# Patient Record
Sex: Male | Born: 1965 | Race: White | Hispanic: No | Marital: Married | Smoking: Former smoker
Health system: Southern US, Community
[De-identification: ages and names within clinical notes are randomized; demographics above are authoritative.]

## PROBLEM LIST (undated history)

## (undated) DIAGNOSIS — I10 Essential (primary) hypertension: Secondary | ICD-10-CM

## (undated) DIAGNOSIS — F419 Anxiety disorder, unspecified: Secondary | ICD-10-CM

## (undated) DIAGNOSIS — I499 Cardiac arrhythmia, unspecified: Secondary | ICD-10-CM

## (undated) DIAGNOSIS — D696 Thrombocytopenia, unspecified: Secondary | ICD-10-CM

## (undated) DIAGNOSIS — E785 Hyperlipidemia, unspecified: Secondary | ICD-10-CM

## (undated) DIAGNOSIS — I6529 Occlusion and stenosis of unspecified carotid artery: Secondary | ICD-10-CM

## (undated) DIAGNOSIS — I2510 Atherosclerotic heart disease of native coronary artery without angina pectoris: Secondary | ICD-10-CM

## (undated) DIAGNOSIS — I493 Ventricular premature depolarization: Secondary | ICD-10-CM

## (undated) HISTORY — PX: OTHER SURGICAL HISTORY: SHX169

## (undated) HISTORY — DX: Occlusion and stenosis of unspecified carotid artery: I65.29

## (undated) SURGERY — LEFT HEART CATH AND CORONARY ANGIOGRAPHY
Anesthesia: Moderate Sedation

---

## 2005-07-14 ENCOUNTER — Ambulatory Visit: Payer: Self-pay

## 2006-01-06 ENCOUNTER — Ambulatory Visit (HOSPITAL_COMMUNITY): Admission: RE | Admit: 2006-01-06 | Discharge: 2006-01-06 | Payer: Self-pay | Admitting: Neurosurgery

## 2008-05-02 ENCOUNTER — Encounter: Admission: RE | Admit: 2008-05-02 | Discharge: 2008-05-02 | Payer: Self-pay | Admitting: Neurosurgery

## 2008-05-09 ENCOUNTER — Ambulatory Visit (HOSPITAL_COMMUNITY): Admission: RE | Admit: 2008-05-09 | Discharge: 2008-05-10 | Payer: Self-pay | Admitting: Neurosurgery

## 2009-04-06 IMAGING — CR DG LUMBAR SPINE 1V
1 series · 1 of 1 positions shown · non-contrast
Comparison: None

CLINICAL DATA: L4-5 discectomy

LUMBAR SPINE - 1 VIEW

[view not recorded]
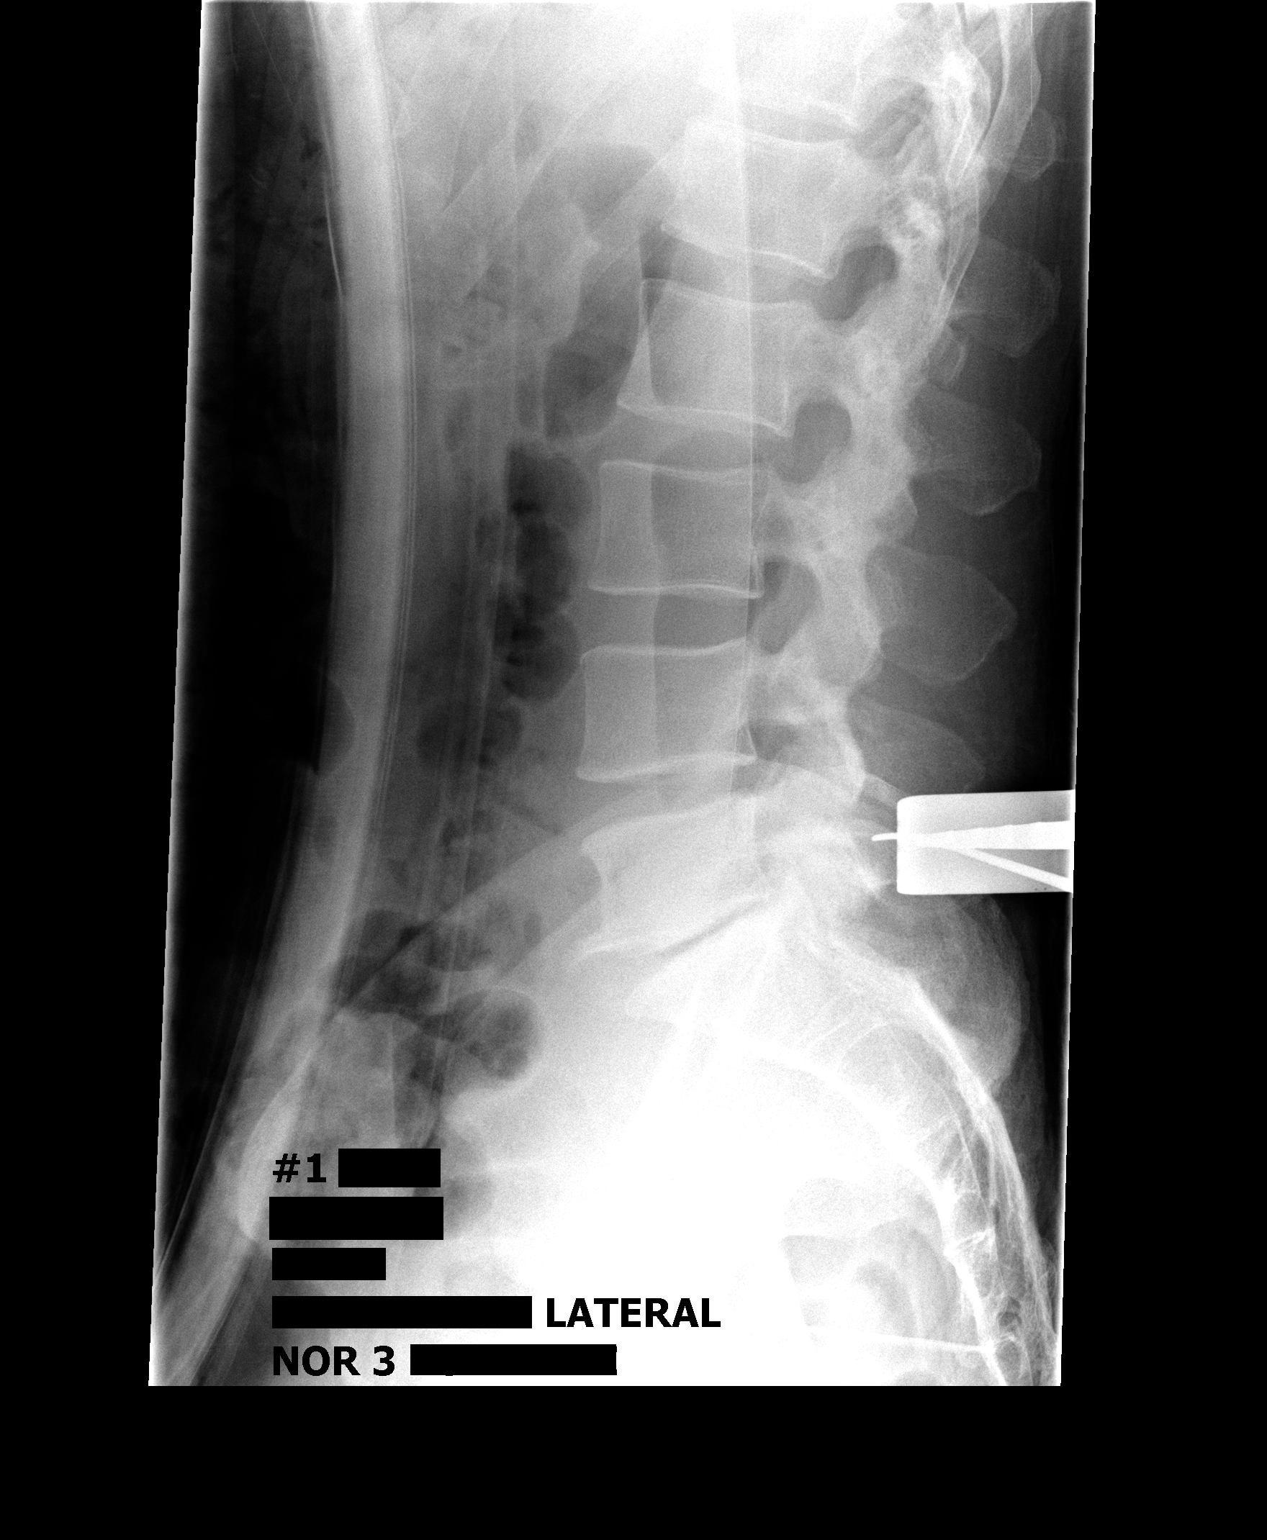

[1 of 1 positions shown; findings below may reference images not displayed]

FINDINGS: Tissue spreaders are in place posteriorly.  A probe is
present at the interspinous space of L4-5.
IMPRESSION: L4-5 interspinous space localized.

## 2009-12-19 ENCOUNTER — Other Ambulatory Visit: Payer: Self-pay | Admitting: Cardiology

## 2009-12-19 ENCOUNTER — Ambulatory Visit: Payer: Self-pay | Admitting: Cardiology

## 2010-10-13 ENCOUNTER — Encounter: Payer: Self-pay | Admitting: Neurosurgery

## 2011-02-03 NOTE — Op Note (Signed)
NAMEESSAM, LOWDERMILK NO.:  [id number]   MEDICAL RECORD NO.:  [id number]          PATIENT TYPE:  OIB   LOCATION:  3535                         FACILITY:  [facility]   PHYSICIAN:  Cristi Loron, M.D.DATE OF BIRTH:  Aug 20, 1966   DATE OF PROCEDURE:  05/09/2008  DATE OF DISCHARGE:                               OPERATIVE REPORT   BRIEF HISTORY:  The patient is a 45 year old white male who I performed  a left L4-L5 diskectomy on, years ago.  The patient did very well but  more recently has developed recurrent back and left leg pain consistent  with left L5 radiculopathy.  He failed medical management and was worked  up with a lumbar MRI, which demonstrated recurrent herniated disk L4-L5.  I discussed the various treatment options with the patient including  surgery.  The patient has weighed the risks, benefits, and alternatives  of surgery and decided to proceed with a redo left L4-L5 diskectomy.   PREOPERATIVE DIAGNOSES:  Left L4-L5 recurrent herniated nucleus  pulposus, degenerative disk disease, lumbar radiculopathy, transverse  myelopathy, and lumbago.   POSTOPERATIVE DIAGNOSIS:  Left L4-L5 recurrent herniated nucleus  pulposus, degenerative disk disease, lumbar radiculopathy, transverse  myelopathy, and lumbago.   PROCEDURE:  Left L4-L5 redo diskectomy using microdissection.   SURGEON:  Cristi Loron, MD   ASSISTANT:  None.   ANESTHESIA:  General endotracheal.   ESTIMATED BLOOD LOSS:  50 mL.   SPECIMENS:  None.   DRAINS:  None.   COMPLICATIONS:  None.   DESCRIPTION OF PROCEDURE:  The patient was brought to the operating room  by the anesthesia team.  General endotracheal anesthesia was induced.  The patient was then turned to the prone position on the Wilson frame.  His lumbosacral region was then shaved with the clippers and prepared  with Betadine scrub and Betadine solution.  Sterile drapes were applied.  I then injected the area to be  incised with Marcaine with epinephrine  solution.  I used a scalpel to make a linear midline incision over the  L4-L5 interspace.  I used electrocautery to perform a left-sided  subperiosteal dissection exposing the left spinous process and lamina of  L4-L5.  We obtained an intraoperative radiograph to confirm our  location.   We then inserted the [facility] retractor for exposure and then brought  the operative microscope into the field.  Under risk magnification  illumination, we completed the microdissection/decompression.  I used  high-speed drill to extend the patient's prior left L4 laminotomy in  cephalad direction.  I drilled until I encountered some relatively not  scarred down dura.  I then used microdissection to free up the epidural  fibrosis from the underlying dura and then performed a widened  laminotomy with Kerrison punch and performed a foraminotomy about the  left L5 nerve root.  I then used microdissection to free up the thecal  sac and nerve root from the epidural tissue and the underlying disk  herniation, and then the neural structures were retracted medially with  a nerve retractor.  This exposed the disk herniation at the  disk space.  I incised the disk herniation with a #15 blade scalpel and we removed  the disk herniation and performed a partial intervertebral dissection  using the pituitary forceps and the Epstein curettes.  After we were  satisfied with the intervertebral diskectomy, we used ostephyte tool to  remove some redundant posterior longitudinal ligament from the vertebral  endplates further decompressing the neural elements.  We then palpated  along the ventral surface of the thecal sac along the exit route of the  L5 nerve root with the  nerve hooks and noted neural structures were  well decompressed.  We obtained hemostasis using bipolar electrocautery  and irrigated the wound out with bacitracin solution.  We removed the  retractor and then  reapproximated the patient's thoracolumbar fascia  with interrupted #1 Vicryl suture, subcutaneous tissues with interrupted  2-0 Vicryl suture, and the skin with Steri-Strips and Benzoin.  The  wound was then coated with bacitracin ointment and sterile dressing was  applied.  The drapes were removed.  The patient was subsequently  returned to supine position where he was extubated by the anesthesia  team and transported to the Postanesthesia Care Unit in stable  condition.  All sponge, instrument, and needle counts were correct at  the end of the case.      Cristi Loron, M.D.  Electronically Signed     JDJ/MEDQ  D:  05/09/2008  T:  05/10/2008  Job:  [id number]

## 2011-02-06 NOTE — Op Note (Signed)
NAMEMASASHI, [name] NO.:  [id number]   MEDICAL RECORD NO.:  [id number]          PATIENT TYPE:  OIB   LOCATION:  3009                         FACILITY:  [facility]   PHYSICIAN:  Cristi Loron, M.D.DATE OF BIRTH:  April 06, 1966   DATE OF PROCEDURE:  01/06/2006  DATE OF DISCHARGE:  01/06/2006                                 OPERATIVE REPORT   BRIEF HISTORY:  The patient is a 45 year old white male who has suffered  from back and left leg pain. He failed medical management and was worked up  with a lumbar MRI. It demonstrated he had a herniated disk at L4-5 on the  left. I discussed the various treatment options with the patient including  surgery. The patient has weighed the risks, benefits, and alternatives of  surgery and decided to proceed with left L4-5 microdiskectomy.   PREOPERATIVE DIAGNOSES:  Left L4-5 herniated nucleus pulposus, degenerative  disk disease, spinal stenosis, lumbar radiculopathy and lumbago.   POSTOPERATIVE DIAGNOSES:  Left L4-5 herniated nucleus pulposus, degenerative  disk disease, spinal stenosis, lumbar radiculopathy and lumbago.   PROCEDURE:  Left L4-5 microdiskectomy using microdissection.   SURGEON:  Cristi Loron, M.D.   ASSISTANT:  Hewitt Shorts, M.D.   ANESTHESIA:  General endotracheal.   ESTIMATED BLOOD LOSS:  50 mL.   SPECIMENS:  None.   DRAINS:  None.   COMPLICATIONS:  None.   DESCRIPTION OF PROCEDURE:  The patient was brought to the operating room by  the anesthesia team. General endotracheal anesthesia was induced. The  patient was turned to the prone position on the Wilson frame. His  lumbosacral region was then prepared with Betadine scrub and Betadine  solution and sterile drapes were applied. I then injected the area to be  incised with Marcaine with epinephrine solution and used a scalpel to make a  linear midline incision over the L4-5 interspace. I used electrocautery and  performed a left sided  subperiosteal dissection exposing the left spinous  process lamina of L4 and L5, obtained an intraoperative radiograph and  confirmed our location. We then inserted the [facility] retractor for  exposure and then brought the operating microscope into the field and under  magnification, the illumination completed and microdissection/decompression.  I used the high speed drill to perform a left L4 laminotomy. I widened the  laminotomy with a Kerrison punch removing the left L4-5 ligamentum flavum. I  performed a foraminotomy about the left L5 nerve root. By then, I used  microdissection to free up the nerve root from the epidural tissue and Dr.  Newell Coral gently retracted the thecal sac and the L5 nerve root medially.  This exposed an underlying disk herniation. I incised into the disk  herniation and removed it using pituitary forceps and we performed a partial  intervertebral diskectomy using the pituitary forceps and the Epstein  curettes. After we were satisfied with the intervertebral diskectomy, we  used the oteophye tool  to remove some redundant ligament from the vertebral  endplates at L4-5 further decompressing the neural structures. We then  palpated along the surface of the  thecal sac and along the exit route  of  the L5 nerve root and noted the neural structures were well decompressed. We  then obtained hemostasis using bipolar electrocautery. We irrigated the  wound out with bacitracin solution and then removed the [facility]  retractor. I then reapproximated the patient's thoracolumbar fascia with  interrupted #1 Vicryl suture, subcutaneous tissue with interrupted 2-0  Vicryl suture and the skin with Steri-Strips and Benzoin. The wound was then  covered with Bacitracin ointment, a sterile dressing applied, the drapes  were removed. The patient was subsequently returned to supine position where  he was extubated by the anesthesia team and transported to the post  anesthesia care  unit in stable condition. All sponge, instrument and needle  counts were correct at the end of this case.      Cristi Loron, M.D.  Electronically Signed     JDJ/MEDQ  D:  01/07/2006  T:  01/08/2006  Job:  [id number]

## 2015-01-22 ENCOUNTER — Other Ambulatory Visit: Payer: Self-pay

## 2015-01-22 ENCOUNTER — Emergency Department
Admission: EM | Admit: 2015-01-22 | Discharge: 2015-01-22 | Disposition: A | Discharge status: Home / Self Care | Payer: BLUE CROSS/BLUE SHIELD | Attending: Emergency Medicine | Admitting: Emergency Medicine

## 2015-01-22 ENCOUNTER — Emergency Department: Payer: BLUE CROSS/BLUE SHIELD

## 2015-01-22 ENCOUNTER — Encounter: Payer: Self-pay | Admitting: Emergency Medicine

## 2015-01-22 DIAGNOSIS — I493 Ventricular premature depolarization: Secondary | ICD-10-CM | POA: Diagnosis not present

## 2015-01-22 DIAGNOSIS — I10 Essential (primary) hypertension: Secondary | ICD-10-CM | POA: Diagnosis not present

## 2015-01-22 DIAGNOSIS — Z720 Tobacco use: Secondary | ICD-10-CM | POA: Insufficient documentation

## 2015-01-22 DIAGNOSIS — R079 Chest pain, unspecified: Secondary | ICD-10-CM | POA: Diagnosis present

## 2015-01-22 HISTORY — DX: Essential (primary) hypertension: I10

## 2015-01-22 LAB — CBC WITH DIFFERENTIAL/PLATELET
Basophils Absolute: 0 10*3/uL (ref 0–0.1)
Eosinophils Absolute: 0.2 10*3/uL (ref 0–0.7)
Eosinophils Relative: 3 %
HCT: 55.5 % — ABNORMAL HIGH (ref 40.0–52.0)
HEMOGLOBIN: 19.4 g/dL — AB (ref 13.0–18.0)
Lymphs Abs: 1.9 10*3/uL (ref 1.0–3.6)
MCH: 33.4 pg (ref 26.0–34.0)
MCHC: 34.9 g/dL (ref 32.0–36.0)
MCV: 95.7 fL (ref 80.0–100.0)
Monocytes Absolute: 0.8 10*3/uL (ref 0.2–1.0)
NEUTROS ABS: 4 10*3/uL (ref 1.4–6.5)
Platelets: 179 10*3/uL (ref 150–440)
RBC: 5.8 MIL/uL (ref 4.40–5.90)
RDW: 12.7 % (ref 11.5–14.5)
WBC: 6.9 10*3/uL (ref 3.8–10.6)

## 2015-01-22 LAB — BASIC METABOLIC PANEL
ANION GAP: 10 (ref 5–15)
BUN: 9 mg/dL (ref 6–20)
CHLORIDE: 104 mmol/L (ref 101–111)
CO2: 27 mmol/L (ref 22–32)
CREATININE: 0.72 mg/dL (ref 0.61–1.24)
Calcium: 9.4 mg/dL (ref 8.9–10.3)
GFR calc Af Amer: >60 mL/min (ref >60)
GFR calc non Af Amer: >60 mL/min (ref >60)
GLUCOSE: 117 mg/dL — AB (ref 65–99)
Potassium: 3 mmol/L — ABNORMAL LOW (ref 3.5–5.1)
Sodium: 141 mmol/L (ref 135–145)

## 2015-01-22 LAB — TROPONIN I
Troponin I: <0.03 ng/mL (ref <0.031)
Troponin I: <0.03 ng/mL (ref <0.031)

## 2015-01-22 MED ORDER — MAGNESIUM OXIDE -MG SUPPLEMENT 400 (240 MG) MG PO TABS
ORAL_TABLET | ORAL | Status: DC
Start: 2015-01-22 — End: 2022-06-10

## 2015-01-22 MED ORDER — HYDROCODONE-ACETAMINOPHEN 5-325 MG PO TABS: 1.0000 | ORAL_TABLET | Freq: Four times a day (QID) | ORAL | Status: DC | PRN

## 2015-01-22 MED ORDER — HYDROCODONE-ACETAMINOPHEN 5-325 MG PO TABS
ORAL_TABLET | ORAL | Status: AC
  Administered 2015-01-22: 1 via ORAL
  Filled 2015-01-22: qty 1

## 2015-01-22 MED ORDER — ONDANSETRON HCL 4 MG/2ML IJ SOLN
INTRAMUSCULAR | Status: AC
  Administered 2015-01-22: 4 mg via INTRAVENOUS
  Filled 2015-01-22: qty 2

## 2015-01-22 MED ORDER — MORPHINE SULFATE 4 MG/ML IJ SOLN
4.0000 mg | Freq: Once | INTRAMUSCULAR | Status: AC
  Administered 2015-01-22: 4 mg via INTRAVENOUS

## 2015-01-22 MED ORDER — ONDANSETRON HCL 4 MG/2ML IJ SOLN
4.0000 mg | Freq: Once | INTRAMUSCULAR | Status: AC
  Administered 2015-01-22: 4 mg via INTRAVENOUS

## 2015-01-22 MED ORDER — HYDROCODONE-ACETAMINOPHEN 5-325 MG PO TABS
1.0000 | ORAL_TABLET | Freq: Once | ORAL | Status: AC
  Administered 2015-01-22: 1 via ORAL

## 2015-01-22 MED ORDER — MORPHINE SULFATE 4 MG/ML IJ SOLN
INTRAMUSCULAR | Status: AC
  Administered 2015-01-22: 4 mg via INTRAVENOUS
  Filled 2015-01-22: qty 1

## 2015-01-22 NOTE — ED Provider Notes (Signed)
[facility] Emergency Department Provider Note    ____________________________________________  Time seen: 9 AM  I have reviewed the triage vital signs and the nursing notes.   HISTORY  Chief Complaint Chest Pain       HPI [name] is a 49 y.o. male who presents with palpitations and chest pain. Symptoms started last night. He had an episode which lasted about 45 minutes in which every 2-3 minutes he felt a twinge sharp left-sided chest pain. There is no shortness of breath or sweating. When he woke up this morning he had another 45 minute episode in which he had these left-sided chest pain/spasms coming every 2-3 seconds. This made him very anxious. He has a history of cardiac stents placed 5 years ago and does not normally have chest pain. Nothing made this better or worse. Severity was moderate.   Past Medical History  Diagnosis Date  . Hypertension     There are no active problems to display for this patient.   Past Surgical History  Procedure Laterality Date  . Stents      No current outpatient prescriptions on file.  Allergies Review of patient's allergies indicates no known allergies.  History reviewed. No pertinent family history.  Social History History  Substance Use Topics  . Smoking status: Current Every Day Smoker  . Smokeless tobacco: Not on file  . Alcohol Use: Yes    Review of Systems  Constitutional: Negative for fever. Eyes: Negative for visual changes. ENT: Negative for sore throat. Cardiovascular: Positive for chest pain and palpitations per history of present illness Respiratory: Negative for shortness of breath. Gastrointestinal: Negative for abdominal pain, vomiting and diarrhea. Genitourinary: No urinary symptoms Musculoskeletal: Negative for back pain. Skin: Negative for rash. Neurological: Negative for headaches, focal weakness or numbness.   10-point ROS otherwise  negative.  ____________________________________________   PHYSICAL EXAM:  VITAL SIGNS: ED Triage Vitals  Enc Vitals Group     BP 01/22/15 0826 148/80 mmHg     Pulse Rate 01/22/15 0826 74     Resp --      Temp 01/22/15 0826 97.5 F (36.4 C)     Temp Source 01/22/15 0826 Oral     SpO2 01/22/15 0826 100 %     Weight 01/22/15 0826 176 lb (79.833 kg)     Height 01/22/15 0826 5\' 10"  (1.778 m)     Head Cir --      Peak Flow --      Pain Score 01/22/15 0828 4     Pain Loc --      Pain Edu? --      Excl. in [place]? --      Constitutional: Alert and oriented. Well appearing and in no distress. Slightly anxious Eyes: Conjunctivae are normal. PERRL. Normal extraocular movements. ENT   Head: Normocephalic and atraumatic.   Nose: No congestion/rhinnorhea.   Mouth/Throat: Mucous membranes are moist.   Neck: No stridor. Hematological/Lymphatic/Immunilogical:  Cardiovascular: Normal rate, irregular rhythm. No murmurs, rubs, or gallops. Respiratory: Normal respiratory effort without tachypnea nor retractions. Breath sounds are clear and equal bilaterally. No wheezes/rales/rhonchi. Gastrointestinal: Soft and nontender. No distention.  Genitourinary:  Musculoskeletal: Nontender with normal range of motion in all extremities. No lower extremity edema Neurologic:  Normal speech and language. No gross focal neurologic deficits are appreciated. Speech is normal.  Skin:  Skin is warm, dry and intact. No rash noted. Psychiatric: Mood and affect are normal. Speech and behavior are normal. Patient exhibits  appropriate insight and judgment.  ____________________________________________    LABS (pertinent positives/negatives)  Labs reassuring with negative troponin over 3 hour recheck.  ____________________________________________   EKG  Radiology 81 bpm Normal sinus rhythm with occasional PVC. Normal axis narrow QRS. Nonspecific ST T  wave.  ____________________________________________    RADIOLOGY  Unremarkable chest x-ray  ____________________________________________   PROCEDURES  Procedure(s) performed: None  Critical Care performed: No  ____________________________________________   INITIAL IMPRESSION / ASSESSMENT AND PLAN / ED COURSE  Pertinent labs & imaging results that were available during my care of the patient were reviewed by me and considered in my medical decision making (see chart for details).  Patient complains of an apical chest pain last night and this morning occurring and episodes of only several seconds each. Patient was having this sporadic chest pain in the emergency department and this corresponded to a PVC seen on the rhythm strip. His EKG is reassuring his labs and workup are reassuring. I discussed this case with Dr. Ubaldo Glassing who agreed this is unlikely ischemic type of pain. He recommended starting magnesium supplementation at a dose of 400 mg daily and follow-up with Dr. Saralyn Pilar as patient's primary cardiologist. We discussed return precautions and follow-up plan.  ____________________________________________   FINAL CLINICAL IMPRESSION(S) / ED DIAGNOSES  Final diagnoses:  PVC (premature ventricular contraction)  Chest pain, unspecified chest pain type     Lisa Roca, MD 01/22/15 1422

## 2015-01-22 NOTE — ED Notes (Signed)
Bedside Xray

## 2015-01-22 NOTE — ED Notes (Signed)
Pt reports that he developed chest pain yesterday. No radiation. Has had SOB and nausea

## 2015-01-23 DIAGNOSIS — I493 Ventricular premature depolarization: Secondary | ICD-10-CM | POA: Insufficient documentation

## 2015-02-12 DIAGNOSIS — F5101 Primary insomnia: Secondary | ICD-10-CM | POA: Insufficient documentation

## 2015-12-12 ENCOUNTER — Encounter
Admission: RE | Disposition: A | Discharge status: Home / Self Care | Payer: Self-pay | Source: Ambulatory Visit | Attending: Unknown Physician Specialty

## 2015-12-12 ENCOUNTER — Ambulatory Visit: Payer: No Typology Code available for payment source | Admitting: Anesthesiology

## 2015-12-12 ENCOUNTER — Ambulatory Visit
Admission: RE | Admit: 2015-12-12 | Discharge: 2015-12-12 | Disposition: A | Discharge status: Home / Self Care | Payer: No Typology Code available for payment source | Source: Ambulatory Visit | Attending: Unknown Physician Specialty | Admitting: Unknown Physician Specialty

## 2015-12-12 ENCOUNTER — Encounter: Payer: Self-pay | Admitting: *Deleted

## 2015-12-12 DIAGNOSIS — F17210 Nicotine dependence, cigarettes, uncomplicated: Secondary | ICD-10-CM | POA: Insufficient documentation

## 2015-12-12 DIAGNOSIS — D123 Benign neoplasm of transverse colon: Secondary | ICD-10-CM | POA: Diagnosis not present

## 2015-12-12 DIAGNOSIS — K640 First degree hemorrhoids: Secondary | ICD-10-CM | POA: Insufficient documentation

## 2015-12-12 DIAGNOSIS — K635 Polyp of colon: Secondary | ICD-10-CM | POA: Diagnosis not present

## 2015-12-12 DIAGNOSIS — I10 Essential (primary) hypertension: Secondary | ICD-10-CM | POA: Diagnosis not present

## 2015-12-12 DIAGNOSIS — Z79899 Other long term (current) drug therapy: Secondary | ICD-10-CM | POA: Diagnosis not present

## 2015-12-12 DIAGNOSIS — Z95818 Presence of other cardiac implants and grafts: Secondary | ICD-10-CM | POA: Diagnosis not present

## 2015-12-12 DIAGNOSIS — Z1211 Encounter for screening for malignant neoplasm of colon: Secondary | ICD-10-CM | POA: Diagnosis present

## 2015-12-12 DIAGNOSIS — Z7982 Long term (current) use of aspirin: Secondary | ICD-10-CM | POA: Diagnosis not present

## 2015-12-12 HISTORY — PX: COLONOSCOPY WITH PROPOFOL: SHX5780

## 2015-12-12 SURGERY — COLONOSCOPY WITH PROPOFOL
Anesthesia: General

## 2015-12-12 MED ORDER — FENTANYL CITRATE (PF) 100 MCG/2ML IJ SOLN
INTRAMUSCULAR | Status: DC | PRN
  Administered 2015-12-12: 50 ug via INTRAVENOUS

## 2015-12-12 MED ORDER — EPHEDRINE SULFATE 50 MG/ML IJ SOLN
INTRAMUSCULAR | Status: DC | PRN
  Administered 2015-12-12: 5 mg via INTRAVENOUS

## 2015-12-12 MED ORDER — PHENYLEPHRINE HCL 10 MG/ML IJ SOLN
INTRAMUSCULAR | Status: DC | PRN
  Administered 2015-12-12: 100 ug via INTRAVENOUS

## 2015-12-12 MED ORDER — PROPOFOL 500 MG/50ML IV EMUL
INTRAVENOUS | Status: DC | PRN
  Administered 2015-12-12: 180 ug/kg/min via INTRAVENOUS

## 2015-12-12 MED ORDER — PROPOFOL 10 MG/ML IV BOLUS
INTRAVENOUS | Status: DC | PRN
  Administered 2015-12-12: 50 mg via INTRAVENOUS

## 2015-12-12 MED ORDER — LIDOCAINE HCL (CARDIAC) 20 MG/ML IV SOLN
INTRAVENOUS | Status: DC | PRN
  Administered 2015-12-12: 30 mg via INTRAVENOUS

## 2015-12-12 MED ORDER — SODIUM CHLORIDE 0.9 % IV SOLN: INTRAVENOUS | Status: DC

## 2015-12-12 MED ORDER — SODIUM CHLORIDE 0.9 % IV SOLN
INTRAVENOUS | Status: DC
  Administered 2015-12-12 (×2): via INTRAVENOUS

## 2015-12-12 MED ORDER — MIDAZOLAM HCL 5 MG/5ML IJ SOLN
INTRAMUSCULAR | Status: DC | PRN
  Administered 2015-12-12: 1 mg via INTRAVENOUS

## 2015-12-12 NOTE — Anesthesia Postprocedure Evaluation (Signed)
Anesthesia Post Note  Patient: [name]  Procedure(s) Performed: Procedure(s) (LRB): COLONOSCOPY WITH PROPOFOL (N/A)  Patient location during evaluation: PACU Anesthesia Type: General Level of consciousness: awake and alert Pain management: pain level controlled Vital Signs Assessment: post-procedure vital signs reviewed and stable Respiratory status: spontaneous breathing and respiratory function stable Cardiovascular status: stable Anesthetic complications: no    Last Vitals:  Filed Vitals:   12/12/15 1030 12/12/15 1229  BP: 135/85 107/53  Pulse: 60 69  Temp: 36.2 C 36.1 C  Resp: 18 19    Last Pain: There were no vitals filed for this visit.               Anusha Claus K

## 2015-12-12 NOTE — Anesthesia Preprocedure Evaluation (Signed)
Anesthesia Evaluation  Patient identified by MRN, date of birth, ID band Patient awake    Reviewed: Allergy & Precautions, NPO status , Patient's Chart, lab work & pertinent test results  History of Anesthesia Complications Negative for: history of anesthetic complications  Airway Mallampati: II       Dental   Pulmonary neg pulmonary ROS, Current Smoker,           Cardiovascular hypertension, Pt. on medications and Pt. on home beta blockers + Cardiac Stents  + dysrhythmias (PVCs)      Neuro/Psych negative neurological ROS     GI/Hepatic negative GI ROS, Neg liver ROS,   Endo/Other  negative endocrine ROS  Renal/GU negative Renal ROS     Musculoskeletal   Abdominal   Peds  Hematology negative hematology ROS (+)   Anesthesia Other Findings   Reproductive/Obstetrics                             Anesthesia Physical Anesthesia Plan  ASA: III  Anesthesia Plan: General   Post-op Pain Management:    Induction: Intravenous  Airway Management Planned: Nasal Cannula  Additional Equipment:   Intra-op Plan:   Post-operative Plan:   Informed Consent: I have reviewed the patients History and Physical, chart, labs and discussed the procedure including the risks, benefits and alternatives for the proposed anesthesia with the patient or authorized representative who has indicated his/her understanding and acceptance.     Plan Discussed with:   Anesthesia Plan Comments:         Anesthesia Quick Evaluation

## 2015-12-12 NOTE — H&P (Signed)
   Primary Care Physician:  Sherrin Daisy, MD Primary Gastroenterologist:  Dr. Vira Agar  Pre-Procedure History & Physical: HPI:  [name] is a 50 y.o. male is here for an colonoscopy.   Past Medical History  Diagnosis Date  . Hypertension     Past Surgical History  Procedure Laterality Date  . Stents      Prior to Admission medications   Medication Sig Start Date End Date Taking? Authorizing Provider  aspirin 325 MG EC tablet Take 325 mg by mouth daily.   Yes Historical Provider, MD  metoprolol succinate (TOPROL-XL) 50 MG 24 hr tablet Take 50 mg by mouth daily. Take with or immediately following a meal.   Yes Historical Provider, MD  niacin (NIASPAN) 500 MG CR tablet Take 500 mg by mouth at bedtime.   Yes Historical Provider, MD  HYDROcodone-acetaminophen (NORCO/VICODIN) 5-325 MG per tablet Take 1 tablet by mouth every 6 (six) hours as needed for moderate pain. 01/22/15   Lisa Roca, MD  Magnesium Oxide 400 (240 MG) MG TABS One tab once per day 01/22/15   Lisa Roca, MD    Allergies as of 11/28/2015  . (No Known Allergies)    History reviewed. No pertinent family history.  Social History   Social History  . Marital Status: Married    Spouse Name: N/A  . Number of Children: N/A  . Years of Education: N/A   Occupational History  . Not on file.   Social History Main Topics  . Smoking status: Current Every Day Smoker -- 0.25 packs/day for 25 years    Types: Cigarettes  . Smokeless tobacco: Never Used  . Alcohol Use: Yes  . Drug Use: No  . Sexual Activity: Not on file   Other Topics Concern  . Not on file   Social History Narrative    Review of Systems: See HPI, otherwise negative ROS  Physical Exam: BP 135/85 mmHg  Pulse 60  Temp(Src) 97.1 F (36.2 C) (Tympanic)  Resp 18  Ht 5\' 10"  (1.778 m)  Wt 77.111 kg (170 lb)  BMI 24.39 kg/m2  SpO2 100% General:   Alert,  pleasant and cooperative in NAD Head:  Normocephalic and atraumatic. Neck:  Supple;  no masses or thyromegaly. Lungs:  Clear throughout to auscultation.    Heart:  Regular rate and rhythm. Abdomen:  Soft, nontender and nondistended. Normal bowel sounds, without guarding, and without rebound.   Neurologic:  Alert and  oriented x4;  grossly normal neurologically.  Impression/Plan: [name] is here for an colonoscopy to be performed for screening  Risks, benefits, limitations, and alternatives regarding  colonoscopy have been reviewed with the patient.  Questions have been answered.  All parties agreeable.   Gaylyn Cheers, MD  12/12/2015, 11:41 AM

## 2015-12-12 NOTE — Transfer of Care (Signed)
Immediate Anesthesia Transfer of Care Note  Patient: [name]  Procedure(s) Performed: Procedure(s): COLONOSCOPY WITH PROPOFOL (N/A)  Patient Location: PACU and Short Stay  Anesthesia Type:General  Level of Consciousness: awake and patient cooperative  Airway & Oxygen Therapy: Patient Spontanous Breathing and Patient connected to nasal cannula oxygen  Post-op Assessment: Report given to RN and Post -op Vital signs reviewed and stable  Post vital signs: Reviewed and stable  Last Vitals:  Filed Vitals:   12/12/15 1030  BP: 135/85  Pulse: 60  Temp: 36.2 C  Resp: 18    Complications: No apparent anesthesia complications

## 2015-12-12 NOTE — Op Note (Signed)
[facility] Gastroenterology Patient Name: [name] Procedure Date: 12/12/2015 11:44 AM MRN: [id number] Account #: [id number] Date of Birth: [birth year] Admit Type: Outpatient Age: 50 Room: [facility] ENDO ROOM 1 Gender: Male Note Status: Finalized Procedure:            Colonoscopy Indications:          Screening for colorectal malignant neoplasm Providers:            Manya Silvas, MD Referring MD:         Shirline Frees (Referring MD) Medicines:            Propofol per Anesthesia Complications:        No immediate complications. Procedure:            Pre-Anesthesia Assessment:                       - After reviewing the risks and benefits, the patient                        was deemed in satisfactory condition to undergo the                        procedure.                       After obtaining informed consent, the colonoscope was                        passed under direct vision. Throughout the procedure,                        the patient's blood pressure, pulse, and oxygen                        saturations were monitored continuously. The                        Colonoscope was introduced through the anus and                        advanced to the the cecum, identified by appendiceal                        orifice and ileocecal valve. The colonoscopy was                        performed without difficulty. The patient tolerated the                        procedure well. The quality of the bowel preparation                        was good. Findings:      A medium polyp was found in the hepatic flexure. The polyp was sessile.       The polyp was removed with a hot snare. Resection and retrieval were       complete. To prevent bleeding after the polypectomy, three hemostatic       clips were successfully placed. There was no bleeding during, or at the       end, of the procedure.  A small polyp was found in the hepatic flexure. The polyp was sessile.        The polyp was removed with a jumbo cold forceps. Resection and retrieval       were complete.      A small polyp was found in the transverse colon. The polyp was sessile.       The polyp was removed with a jumbo cold forceps. Resection and retrieval       were complete.      Internal hemorrhoids were found during endoscopy. The hemorrhoids were       medium-sized and Grade I (internal hemorrhoids that do not prolapse). Impression:           - One medium polyp at the hepatic flexure, removed with                        a hot snare. Resected and retrieved. Clips were placed.                       - One small polyp at the hepatic flexure, removed with                        a jumbo cold forceps. Resected and retrieved.                       - One small polyp in the transverse colon, removed with                        a jumbo cold forceps. Resected and retrieved.                       - Internal hemorrhoids. Recommendation:       - Await pathology results. Manya Silvas, MD 12/12/2015 12:23:41 PM This report has been signed electronically. Number of Addenda: 0 Note Initiated On: 12/12/2015 11:44 AM Scope Withdrawal Time: 0 hours 18 minutes 0 seconds  Total Procedure Duration: 0 hours 30 minutes 57 seconds       [facility]

## 2015-12-13 LAB — SURGICAL PATHOLOGY

## 2015-12-14 ENCOUNTER — Encounter: Payer: Self-pay | Admitting: Unknown Physician Specialty

## 2015-12-20 IMAGING — CR DG CHEST 1V PORT
1 series · 1 of 1 positions shown · non-contrast
Comparison: 05/09/2008

CLINICAL DATA: Chest pain, smoker

EXAM:
PORTABLE CHEST - 1 VIEW

[ap]
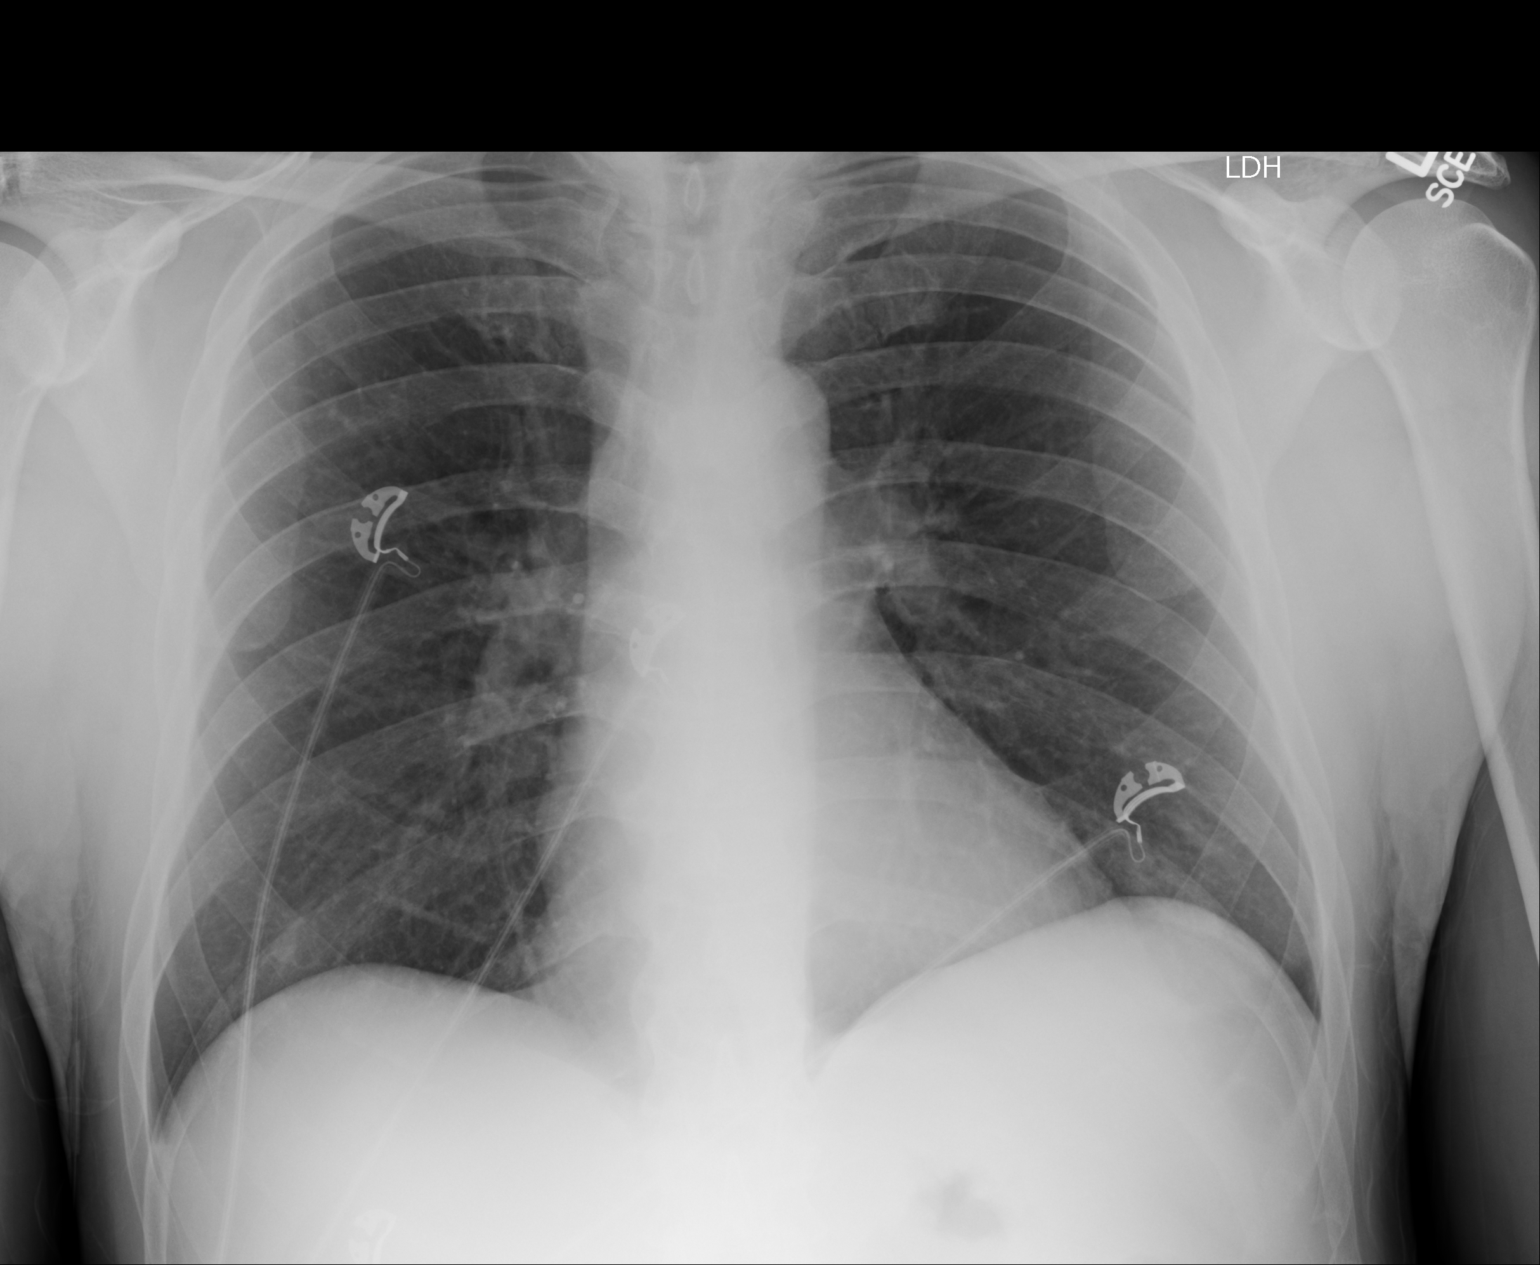

[1 of 1 positions shown; findings below may reference images not displayed]

FINDINGS: Cardiomediastinal silhouette is stable. No acute infiltrate or
pleural effusion. No pulmonary edema. Bony thorax is unremarkable.
IMPRESSION: No active disease.

## 2016-01-01 DIAGNOSIS — G43109 Migraine with aura, not intractable, without status migrainosus: Secondary | ICD-10-CM | POA: Insufficient documentation

## 2016-02-26 DIAGNOSIS — D751 Secondary polycythemia: Secondary | ICD-10-CM | POA: Insufficient documentation

## 2017-02-24 ENCOUNTER — Encounter: Payer: Self-pay | Admitting: *Deleted

## 2017-02-24 ENCOUNTER — Ambulatory Visit
Admission: RE | Admit: 2017-02-24 | Discharge: 2017-02-25 | Disposition: A | Discharge status: Home / Self Care | Payer: No Typology Code available for payment source | Source: Ambulatory Visit | Attending: Cardiology | Admitting: Cardiology

## 2017-02-24 ENCOUNTER — Encounter
Admission: RE | Disposition: A | Discharge status: Home / Self Care | Payer: Self-pay | Source: Ambulatory Visit | Attending: Cardiology

## 2017-02-24 DIAGNOSIS — Y713 Surgical instruments, materials and cardiovascular devices (including sutures) associated with adverse incidents: Secondary | ICD-10-CM | POA: Diagnosis not present

## 2017-02-24 DIAGNOSIS — F17210 Nicotine dependence, cigarettes, uncomplicated: Secondary | ICD-10-CM | POA: Insufficient documentation

## 2017-02-24 DIAGNOSIS — I10 Essential (primary) hypertension: Secondary | ICD-10-CM | POA: Insufficient documentation

## 2017-02-24 DIAGNOSIS — I2510 Atherosclerotic heart disease of native coronary artery without angina pectoris: Secondary | ICD-10-CM | POA: Insufficient documentation

## 2017-02-24 DIAGNOSIS — N529 Male erectile dysfunction, unspecified: Secondary | ICD-10-CM | POA: Insufficient documentation

## 2017-02-24 DIAGNOSIS — E785 Hyperlipidemia, unspecified: Secondary | ICD-10-CM | POA: Diagnosis not present

## 2017-02-24 DIAGNOSIS — I493 Ventricular premature depolarization: Secondary | ICD-10-CM | POA: Insufficient documentation

## 2017-02-24 DIAGNOSIS — I25118 Atherosclerotic heart disease of native coronary artery with other forms of angina pectoris: Secondary | ICD-10-CM | POA: Diagnosis present

## 2017-02-24 DIAGNOSIS — Z79899 Other long term (current) drug therapy: Secondary | ICD-10-CM | POA: Insufficient documentation

## 2017-02-24 DIAGNOSIS — T82855A Stenosis of coronary artery stent, initial encounter: Secondary | ICD-10-CM | POA: Insufficient documentation

## 2017-02-24 DIAGNOSIS — Z7982 Long term (current) use of aspirin: Secondary | ICD-10-CM | POA: Insufficient documentation

## 2017-02-24 DIAGNOSIS — Z8249 Family history of ischemic heart disease and other diseases of the circulatory system: Secondary | ICD-10-CM | POA: Insufficient documentation

## 2017-02-24 DIAGNOSIS — Z888 Allergy status to other drugs, medicaments and biological substances status: Secondary | ICD-10-CM | POA: Diagnosis not present

## 2017-02-24 HISTORY — PX: LEFT HEART CATH AND CORONARY ANGIOGRAPHY: CATH118249

## 2017-02-24 HISTORY — PX: CORONARY STENT INTERVENTION: CATH118234

## 2017-02-24 LAB — POCT ACTIVATED CLOTTING TIME: ACTIVATED CLOTTING TIME: 373 s

## 2017-02-24 SURGERY — LEFT HEART CATH AND CORONARY ANGIOGRAPHY
Anesthesia: Moderate Sedation

## 2017-02-24 MED ORDER — ONDANSETRON HCL 4 MG/2ML IJ SOLN: 4.0000 mg | Freq: Four times a day (QID) | INTRAMUSCULAR | Status: DC | PRN

## 2017-02-24 MED ORDER — BIVALIRUDIN BOLUS VIA INFUSION - CUPID
INTRAVENOUS | Status: DC | PRN
  Administered 2017-02-24: 57.15 mg via INTRAVENOUS

## 2017-02-24 MED ORDER — MIDAZOLAM HCL 2 MG/2ML IJ SOLN
INTRAMUSCULAR | Status: AC
  Filled 2017-02-24: qty 2

## 2017-02-24 MED ORDER — SODIUM CHLORIDE 0.9 % WEIGHT BASED INFUSION: 1.0000 mL/kg/h | INTRAVENOUS | Status: DC

## 2017-02-24 MED ORDER — LORATADINE 10 MG PO TABS
10.0000 mg | ORAL_TABLET | Freq: Every day | ORAL | Status: DC
  Administered 2017-02-25: 10 mg via ORAL
  Filled 2017-02-24: qty 1

## 2017-02-24 MED ORDER — SODIUM CHLORIDE 0.9 % WEIGHT BASED INFUSION: 3.0000 mL/kg/h | INTRAVENOUS | Status: DC

## 2017-02-24 MED ORDER — MIDAZOLAM HCL 2 MG/2ML IJ SOLN
INTRAMUSCULAR | Status: DC | PRN
  Administered 2017-02-24: 1 mg via INTRAVENOUS

## 2017-02-24 MED ORDER — HYDRALAZINE HCL 20 MG/ML IJ SOLN
5.0000 mg | INTRAMUSCULAR | Status: AC | PRN
Start: 2017-02-24 — End: 2017-02-24

## 2017-02-24 MED ORDER — CLOPIDOGREL BISULFATE 300 MG PO TABS
ORAL_TABLET | ORAL | Status: AC
  Filled 2017-02-24: qty 2

## 2017-02-24 MED ORDER — MAGNESIUM OXIDE 400 (241.3 MG) MG PO TABS
400.0000 mg | ORAL_TABLET | Freq: Every day | ORAL | Status: DC
  Administered 2017-02-25: 400 mg via ORAL
  Filled 2017-02-24: qty 1

## 2017-02-24 MED ORDER — FENTANYL CITRATE (PF) 100 MCG/2ML IJ SOLN
INTRAMUSCULAR | Status: AC
  Filled 2017-02-24: qty 2

## 2017-02-24 MED ORDER — SODIUM CHLORIDE 0.9 % IV SOLN: 250.0000 mL | INTRAVENOUS | Status: DC | PRN

## 2017-02-24 MED ORDER — SODIUM CHLORIDE 0.9% FLUSH
3.0000 mL | Freq: Two times a day (BID) | INTRAVENOUS | Status: DC
  Administered 2017-02-24: 3 mL via INTRAVENOUS

## 2017-02-24 MED ORDER — ISOSORBIDE MONONITRATE ER 30 MG PO TB24
30.0000 mg | ORAL_TABLET | Freq: Every day | ORAL | Status: DC
  Administered 2017-02-25: 30 mg via ORAL
  Filled 2017-02-24: qty 1

## 2017-02-24 MED ORDER — METOPROLOL SUCCINATE ER 50 MG PO TB24
50.0000 mg | ORAL_TABLET | Freq: Every day | ORAL | Status: DC
  Administered 2017-02-25: 50 mg via ORAL
  Filled 2017-02-24: qty 1

## 2017-02-24 MED ORDER — SODIUM CHLORIDE 0.9% FLUSH: 3.0000 mL | Freq: Two times a day (BID) | INTRAVENOUS | Status: DC

## 2017-02-24 MED ORDER — HYDROCHLOROTHIAZIDE 25 MG PO TABS
25.0000 mg | ORAL_TABLET | Freq: Every day | ORAL | Status: DC
  Administered 2017-02-25: 25 mg via ORAL
  Filled 2017-02-24: qty 1

## 2017-02-24 MED ORDER — ACETAMINOPHEN 325 MG PO TABS: 650.0000 mg | ORAL_TABLET | ORAL | Status: DC | PRN

## 2017-02-24 MED ORDER — LABETALOL HCL 5 MG/ML IV SOLN: 10.0000 mg | INTRAVENOUS | Status: AC | PRN

## 2017-02-24 MED ORDER — SODIUM CHLORIDE 0.9% FLUSH: 3.0000 mL | INTRAVENOUS | Status: DC | PRN

## 2017-02-24 MED ORDER — POTASSIUM CHLORIDE CRYS ER 20 MEQ PO TBCR
10.0000 meq | EXTENDED_RELEASE_TABLET | Freq: Every day | ORAL | Status: DC
  Administered 2017-02-25: 10 meq via ORAL
  Filled 2017-02-24: qty 1

## 2017-02-24 MED ORDER — SODIUM CHLORIDE 0.9 % WEIGHT BASED INFUSION
1.0000 mL/kg/h | INTRAVENOUS | Status: AC
  Administered 2017-02-24: 1 mL/kg/h via INTRAVENOUS

## 2017-02-24 MED ORDER — FLUTICASONE PROPIONATE 50 MCG/ACT NA SUSP
2.0000 | Freq: Every day | NASAL | Status: DC
  Filled 2017-02-24: qty 16

## 2017-02-24 MED ORDER — CLOPIDOGREL BISULFATE 75 MG PO TABS
ORAL_TABLET | ORAL | Status: DC | PRN
  Administered 2017-02-24: 600 mg via ORAL

## 2017-02-24 MED ORDER — SODIUM CHLORIDE 0.9 % IV SOLN
INTRAVENOUS | Status: AC | PRN
  Administered 2017-02-24: 1.75 mg/kg/h via INTRAVENOUS

## 2017-02-24 MED ORDER — IOPAMIDOL (ISOVUE-300) INJECTION 61%
INTRAVENOUS | Status: DC | PRN
  Administered 2017-02-24: 230 mL via INTRA_ARTERIAL

## 2017-02-24 MED ORDER — ASPIRIN 81 MG PO CHEW
324.0000 mg | CHEWABLE_TABLET | Freq: Every day | ORAL | Status: DC
  Administered 2017-02-25: 324 mg via ORAL
  Filled 2017-02-24: qty 4

## 2017-02-24 MED ORDER — NITROGLYCERIN 5 MG/ML IV SOLN
INTRAVENOUS | Status: AC
  Filled 2017-02-24: qty 10

## 2017-02-24 MED ORDER — ZOLPIDEM TARTRATE 5 MG PO TABS: 5.0000 mg | ORAL_TABLET | Freq: Every evening | ORAL | Status: DC | PRN

## 2017-02-24 MED ORDER — FENTANYL CITRATE (PF) 100 MCG/2ML IJ SOLN
INTRAMUSCULAR | Status: DC | PRN
  Administered 2017-02-24 (×2): 25 ug via INTRAVENOUS

## 2017-02-24 MED ORDER — BIVALIRUDIN TRIFLUOROACETATE 250 MG IV SOLR
INTRAVENOUS | Status: AC
  Filled 2017-02-24: qty 250

## 2017-02-24 MED ORDER — CLOPIDOGREL BISULFATE 75 MG PO TABS
75.0000 mg | ORAL_TABLET | Freq: Every day | ORAL | Status: DC
  Administered 2017-02-25: 75 mg via ORAL
  Filled 2017-02-24: qty 1

## 2017-02-24 MED ORDER — ASPIRIN 81 MG PO CHEW: 81.0000 mg | CHEWABLE_TABLET | ORAL | Status: DC

## 2017-02-24 MED ORDER — HEPARIN (PORCINE) IN NACL 2-0.9 UNIT/ML-% IJ SOLN
INTRAMUSCULAR | Status: AC
  Filled 2017-02-24: qty 500

## 2017-02-24 MED ORDER — NITROGLYCERIN 1 MG/10 ML FOR IR/CATH LAB
INTRA_ARTERIAL | Status: DC | PRN
  Administered 2017-02-24 (×2): 200 ug via INTRACORONARY

## 2017-02-24 SURGICAL SUPPLY — 17 items
BALLN TREK RX 2.5X15 (BALLOONS) ×4
BALLOON TREK RX 2.5X15 (BALLOONS) IMPLANT
CATH INFINITI 5FR ANG PIGTAIL (CATHETERS) ×2 IMPLANT
CATH INFINITI 5FR JL4 (CATHETERS) ×2 IMPLANT
CATH INFINITI JR4 5F (CATHETERS) ×2 IMPLANT
CATH VISTA GUIDE 6FR JR4 SH (CATHETERS) ×2 IMPLANT
DEVICE CLOSURE MYNXGRIP 6/7F (Vascular Products) ×2 IMPLANT
DEVICE INFLAT 30 PLUS (MISCELLANEOUS) ×2 IMPLANT
KIT MANI 3VAL PERCEP (MISCELLANEOUS) ×4 IMPLANT
NDL PERC 18GX7CM (NEEDLE) IMPLANT
NEEDLE PERC 18GX7CM (NEEDLE) ×4 IMPLANT
PACK CARDIAC CATH (CUSTOM PROCEDURE TRAY) ×4 IMPLANT
SHEATH AVANTI 5FR X 11CM (SHEATH) ×2 IMPLANT
SHEATH AVANTI 6FR X 11CM (SHEATH) ×2 IMPLANT
STENT RESOLUTE ONYX 3.0X22 (Permanent Stent) ×2 IMPLANT
WIRE ASAHI PROWATER 180CM (WIRE) ×2 IMPLANT
WIRE EMERALD 3MM-J .035X150CM (WIRE) ×2 IMPLANT

## 2017-02-24 NOTE — Care Management (Signed)
Patient with elective cardiac cath resulting in PCI.  No complications. At present, it does not appear will be discharged home on cost prohibitive antiplatelet medication

## 2017-02-24 NOTE — Progress Notes (Signed)
Patient remains clinically stable post stent placement. angiomax gtt off now, no bleeding nor hematoma at right groin site.

## 2017-02-24 NOTE — Progress Notes (Signed)
Patient remains clinically stable post heart cath with stent placement to mid RCA per Dr Saralyn Pilar. Vitals stable, no bleeding nor hematoma at right groin site. Iv ns infusing for 10 hours at ordered rate per orders. angiomax gtt to complete at 1130 am. Report called to Casey Burkitt on telemetry ([phone]) with orders and plan reviewed.

## 2017-02-25 DIAGNOSIS — I2510 Atherosclerotic heart disease of native coronary artery without angina pectoris: Secondary | ICD-10-CM | POA: Diagnosis not present

## 2017-02-25 LAB — CBC
HEMATOCRIT: 46.5 % (ref 40.0–52.0)
Hemoglobin: 16.6 g/dL (ref 13.0–18.0)
MCH: 33.1 pg (ref 26.0–34.0)
MCHC: 35.7 g/dL (ref 32.0–36.0)
MCV: 92.7 fL (ref 80.0–100.0)
PLATELETS: 143 10*3/uL — AB (ref 150–440)
RBC: 5.01 MIL/uL (ref 4.40–5.90)
RDW: 12.8 % (ref 11.5–14.5)
WBC: 7.8 10*3/uL (ref 3.8–10.6)

## 2017-02-25 LAB — BASIC METABOLIC PANEL
Anion gap: 6 (ref 5–15)
BUN: 8 mg/dL (ref 6–20)
CHLORIDE: 103 mmol/L (ref 101–111)
CO2: 30 mmol/L (ref 22–32)
CREATININE: 0.78 mg/dL (ref 0.61–1.24)
Calcium: 8.7 mg/dL — ABNORMAL LOW (ref 8.9–10.3)
GFR calc Af Amer: >60 mL/min (ref >60)
GFR calc non Af Amer: >60 mL/min (ref >60)
Glucose, Bld: 98 mg/dL (ref 65–99)
POTASSIUM: 3.1 mmol/L — AB (ref 3.5–5.1)
SODIUM: 139 mmol/L (ref 135–145)

## 2017-02-25 MED ORDER — CLOPIDOGREL BISULFATE 75 MG PO TABS: 75.0000 mg | ORAL_TABLET | Freq: Once | ORAL | 11 refills | Status: AC

## 2017-02-25 NOTE — Plan of Care (Signed)
Problem: Activity: Goal: Risk for activity intolerance will decrease Outcome: Adequate for Discharge Pt independent in the room. No distress noted with activity.

## 2017-02-25 NOTE — Plan of Care (Signed)
Problem: Safety: Goal: Ability to remain free from injury will improve Outcome: Completed/Met Date Met: 02/25/17 Independent in the room

## 2017-02-25 NOTE — Discharge Instructions (Signed)
Continue to take aspirin and Plavix uninterrupted for one year.

## 2017-02-25 NOTE — Discharge Planning (Signed)
Physician Discharge Summary  Patient ID: [name] MRN: [id number] DOB/AGE: 53-Sep-1967 53 y.o.  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 After conservative measures were exhausted and the patient continued to have chest pain, elective cardiac catheterization was scheduled with potential PCI. Patient underwent elective cardiac catheterization on 02/24/17 performed by Dr. 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 Patient received a Resolute Onyx 3.0 x 22 mm drug-eluting stent, which overlapped previously placed stent with excellent angiographic results. This morning, the patient denies chest pain and reports feeling well. He has ambulated this morning without difficulty. It has tenderness to the right groin, without significant pain.   Discharge Exam: Blood pressure (!) 164/78, pulse 67, temperature 98.3 F (36.8 C), temperature source Oral, resp. rate 16, height 5\' 9"  (1.753 m), weight 76.2 kg (168 lb), SpO2 96 %.  General appearance: alert, cooperative, appears stated age and no distress Head: Normocephalic, without obvious abnormality, atraumatic Eyes: EOM intact Back: negative Resp: clear to auscultation bilaterally Cardio: regular rate and rhythm, S1, S2 normal, no murmur, click, rub or gallop Extremities: extremities normal, atraumatic, no cyanosis or edema Skin: Warm, dry, no diaphoresis. Right groin- diffuse bruising without hematoma, active bleeding, edema Labs:   Lab Results  Component Value Date   WBC 7.8 02/25/2017   HGB 16.6 02/25/2017   HCT 46.5 02/25/2017   MCV 92.7 02/25/2017   PLT 143 (L) 02/25/2017     Recent Labs Lab 02/25/17 0546  NA 139  K 3.1*  CL 103  CO2 30  BUN 8  CREATININE 0.78  CALCIUM 8.7*  GLUCOSE 98       EKG: Normal sinus rhythm, 65 bpm  FOLLOW UP PLANS AND APPOINTMENTS  Allergies as of 02/25/2017      Reactions   Lopid [gemfibrozil]    Statins Other (See Comments)   Pain and cramps      Medication List    TAKE these medications   aspirin 325 MG EC tablet Take 325 mg by mouth daily.   clopidogrel 75 MG tablet Commonly known as:  PLAVIX Take 1 tablet (75 mg total) by mouth once.   fluticasone 50 MCG/ACT nasal spray Commonly known as:  FLONASE Place 1 spray into both nostrils daily as needed for allergies or rhinitis.   isosorbide mononitrate 30 MG 24 hr tablet Commonly known as:  IMDUR Take 30 mg by mouth daily.   Magnesium Oxide 400 (240 Mg) MG Tabs One tab once per day What changed:  how much to take  how to take this  when to take this  additional instructions   metoprolol succinate 50 MG 24 hr tablet Commonly known as:  TOPROL-XL Take 50 mg by mouth daily. Take with or immediately following a meal.   potassium chloride 10 MEQ tablet Commonly known as:  K-DUR,KLOR-CON Take 10 mEq by mouth daily.   SUMAtriptan 50 MG tablet Commonly known as:  IMITREX Take 50 mg by mouth every 2 (two) hours as needed for migraine. May repeat in 2 hours if headache persists or recurs.   valsartan-hydrochlorothiazide 320-25 MG tablet Commonly known as:  DIOVAN-HCT Take 1 tablet by mouth daily.   zolpidem 5 MG tablet Commonly  known as:  AMBIEN Take 5 mg by mouth at bedtime as needed.      Follow-up Information    Isaias Cowman, MD. Go on 03/03/2017.   Specialty:  Cardiology Why:  Appointment Time: 9:30am Contact information: [facility] [place] [address] [phone]           BRING ALL MEDICATIONS WITH YOU TO FOLLOW UP APPOINTMENTS  Time spent with  patient to include physician time: 25 minutes Signed:  Clabe Seal PA-C 02/25/2017, 10:08 AM

## 2017-03-22 DIAGNOSIS — D696 Thrombocytopenia, unspecified: Secondary | ICD-10-CM | POA: Insufficient documentation

## 2017-06-14 DIAGNOSIS — Z955 Presence of coronary angioplasty implant and graft: Secondary | ICD-10-CM | POA: Insufficient documentation

## 2018-11-10 ENCOUNTER — Other Ambulatory Visit: Payer: Self-pay

## 2018-11-10 ENCOUNTER — Encounter: Payer: Self-pay | Admitting: Internal Medicine

## 2018-11-10 ENCOUNTER — Emergency Department: Payer: No Typology Code available for payment source

## 2018-11-10 ENCOUNTER — Inpatient Hospital Stay
Admission: EM | Admit: 2018-11-10 | Discharge: 2018-11-12 | DRG: 247 | Disposition: A | Discharge status: Home / Self Care | Payer: No Typology Code available for payment source | Attending: Internal Medicine | Admitting: Internal Medicine

## 2018-11-10 DIAGNOSIS — I209 Angina pectoris, unspecified: Secondary | ICD-10-CM | POA: Diagnosis present

## 2018-11-10 DIAGNOSIS — I25118 Atherosclerotic heart disease of native coronary artery with other forms of angina pectoris: Secondary | ICD-10-CM | POA: Diagnosis present

## 2018-11-10 DIAGNOSIS — Z7982 Long term (current) use of aspirin: Secondary | ICD-10-CM

## 2018-11-10 DIAGNOSIS — E785 Hyperlipidemia, unspecified: Secondary | ICD-10-CM | POA: Diagnosis present

## 2018-11-10 DIAGNOSIS — R079 Chest pain, unspecified: Secondary | ICD-10-CM | POA: Diagnosis present

## 2018-11-10 DIAGNOSIS — F17210 Nicotine dependence, cigarettes, uncomplicated: Secondary | ICD-10-CM | POA: Diagnosis present

## 2018-11-10 DIAGNOSIS — Z79899 Other long term (current) drug therapy: Secondary | ICD-10-CM

## 2018-11-10 DIAGNOSIS — Z888 Allergy status to other drugs, medicaments and biological substances status: Secondary | ICD-10-CM

## 2018-11-10 DIAGNOSIS — I25110 Atherosclerotic heart disease of native coronary artery with unstable angina pectoris: Secondary | ICD-10-CM | POA: Diagnosis not present

## 2018-11-10 DIAGNOSIS — I2510 Atherosclerotic heart disease of native coronary artery without angina pectoris: Secondary | ICD-10-CM | POA: Diagnosis present

## 2018-11-10 DIAGNOSIS — Z8249 Family history of ischemic heart disease and other diseases of the circulatory system: Secondary | ICD-10-CM

## 2018-11-10 DIAGNOSIS — Z955 Presence of coronary angioplasty implant and graft: Secondary | ICD-10-CM

## 2018-11-10 DIAGNOSIS — I10 Essential (primary) hypertension: Secondary | ICD-10-CM | POA: Diagnosis present

## 2018-11-10 DIAGNOSIS — R9431 Abnormal electrocardiogram [ECG] [EKG]: Secondary | ICD-10-CM

## 2018-11-10 DIAGNOSIS — F419 Anxiety disorder, unspecified: Secondary | ICD-10-CM | POA: Diagnosis present

## 2018-11-10 HISTORY — DX: Anxiety disorder, unspecified: F41.9

## 2018-11-10 HISTORY — DX: Thrombocytopenia, unspecified: D69.6

## 2018-11-10 HISTORY — DX: Atherosclerotic heart disease of native coronary artery without angina pectoris: I25.10

## 2018-11-10 HISTORY — DX: Hyperlipidemia, unspecified: E78.5

## 2018-11-10 LAB — CBC WITH DIFFERENTIAL/PLATELET
Abs Immature Granulocytes: 0.02 10*3/uL (ref 0.00–0.07)
BASOS ABS: 0 10*3/uL (ref 0.0–0.1)
Basophils Relative: 1 %
Eosinophils Absolute: 0.1 10*3/uL (ref 0.0–0.5)
Eosinophils Relative: 2 %
HCT: 47.9 % (ref 39.0–52.0)
Hemoglobin: 16.9 g/dL (ref 13.0–17.0)
Immature Granulocytes: 0 %
LYMPHS ABS: 3.7 10*3/uL (ref 0.7–4.0)
LYMPHS PCT: 42 %
MCH: 32.4 pg (ref 26.0–34.0)
MCHC: 35.3 g/dL (ref 30.0–36.0)
MCV: 91.8 fL (ref 80.0–100.0)
Monocytes Absolute: 0.9 10*3/uL (ref 0.1–1.0)
Monocytes Relative: 10 %
Neutro Abs: 3.9 10*3/uL (ref 1.7–7.7)
Neutrophils Relative %: 45 %
PLATELETS: 158 10*3/uL (ref 150–400)
RBC: 5.22 MIL/uL (ref 4.22–5.81)
RDW: 13 % (ref 11.5–15.5)
WBC: 8.6 10*3/uL (ref 4.0–10.5)
nRBC: 0 % (ref 0.0–0.2)

## 2018-11-10 LAB — COMPREHENSIVE METABOLIC PANEL
ALT: 52 U/L — ABNORMAL HIGH (ref 0–44)
AST: 32 U/L (ref 15–41)
Albumin: 4.4 g/dL (ref 3.5–5.0)
Alkaline Phosphatase: 70 U/L (ref 38–126)
Anion gap: 12 (ref 5–15)
BUN: 13 mg/dL (ref 6–20)
CHLORIDE: 100 mmol/L (ref 98–111)
CO2: 29 mmol/L (ref 22–32)
Calcium: 9.2 mg/dL (ref 8.9–10.3)
Creatinine, Ser: 0.98 mg/dL (ref 0.61–1.24)
GFR calc Af Amer: >60 mL/min (ref >60)
GFR calc non Af Amer: >60 mL/min (ref >60)
Glucose, Bld: 148 mg/dL — ABNORMAL HIGH (ref 70–99)
Potassium: 2.9 mmol/L — ABNORMAL LOW (ref 3.5–5.1)
Sodium: 141 mmol/L (ref 135–145)
Total Bilirubin: 1.5 mg/dL — ABNORMAL HIGH (ref 0.3–1.2)
Total Protein: 7.4 g/dL (ref 6.5–8.1)

## 2018-11-10 LAB — PROTIME-INR
INR: 0.97
Prothrombin Time: 12.8 seconds (ref 11.4–15.2)

## 2018-11-10 LAB — APTT: aPTT: 32 seconds (ref 24–36)

## 2018-11-10 LAB — TROPONIN I
Troponin I: <0.03 ng/mL (ref <0.03)
Troponin I: 0.06 ng/mL (ref <0.03)

## 2018-11-10 MED ORDER — HEPARIN (PORCINE) 25000 UT/250ML-% IV SOLN
1200.0000 [IU]/h | INTRAVENOUS | Status: DC
  Administered 2018-11-10: 900 [IU]/h via INTRAVENOUS
  Filled 2018-11-10: qty 250

## 2018-11-10 MED ORDER — ACETAMINOPHEN 325 MG PO TABS: 650.0000 mg | ORAL_TABLET | Freq: Four times a day (QID) | ORAL | Status: DC | PRN

## 2018-11-10 MED ORDER — NITROGLYCERIN 0.4 MG SL SUBL
0.4000 mg | SUBLINGUAL_TABLET | SUBLINGUAL | Status: AC | PRN
  Administered 2018-11-10 – 2018-11-11 (×4): 0.4 mg via SUBLINGUAL
  Filled 2018-11-10: qty 1

## 2018-11-10 MED ORDER — ASPIRIN EC 325 MG PO TBEC
325.0000 mg | DELAYED_RELEASE_TABLET | Freq: Every day | ORAL | Status: DC
  Administered 2018-11-11: 325 mg via ORAL
  Filled 2018-11-10: qty 1

## 2018-11-10 MED ORDER — ONDANSETRON HCL 4 MG/2ML IJ SOLN: 4.0000 mg | Freq: Four times a day (QID) | INTRAMUSCULAR | Status: DC | PRN

## 2018-11-10 MED ORDER — HEPARIN BOLUS VIA INFUSION
4000.0000 [IU] | Freq: Once | INTRAVENOUS | Status: AC
  Administered 2018-11-10: 4000 [IU] via INTRAVENOUS
  Filled 2018-11-10: qty 4000

## 2018-11-10 MED ORDER — ACETAMINOPHEN 650 MG RE SUPP: 650.0000 mg | Freq: Four times a day (QID) | RECTAL | Status: DC | PRN

## 2018-11-10 MED ORDER — ONDANSETRON HCL 4 MG PO TABS: 4.0000 mg | ORAL_TABLET | Freq: Four times a day (QID) | ORAL | Status: DC | PRN

## 2018-11-10 MED ORDER — POTASSIUM CHLORIDE CRYS ER 20 MEQ PO TBCR
40.0000 meq | EXTENDED_RELEASE_TABLET | Freq: Once | ORAL | Status: AC
  Administered 2018-11-10: 40 meq via ORAL
  Filled 2018-11-10: qty 2

## 2018-11-10 MED ORDER — NITROGLYCERIN 0.4 MG SL SUBL
SUBLINGUAL_TABLET | SUBLINGUAL | Status: AC
  Administered 2018-11-10: 0.4 mg
  Filled 2018-11-10: qty 1

## 2018-11-10 NOTE — ED Provider Notes (Signed)
[facility] Emergency Department Provider Note    First MD Initiated Contact with Patient 11/10/18 2022     (approximate)  I have reviewed the triage vital signs and the nursing notes.   HISTORY  Chief Complaint Chest Pain    HPI [name] is a 53 y.o. male with a history of hypertension as well as high cholesterol status post heart cath known CAD with stents presents the ER with chest pain and pressure that started this evening after he took his dog on a walk.  Did not become diaphoretic but states he was having pain radiating to his jaw.  Pain did get somewhat better after rest but was still having some discomfort.  Denies any fevers or cough.  EMS was called patient was given aspirin as well as nitro with some improvement in his symptoms.  States that the pain has largely subsided but still with some mild discomfort.   States that he quit smoking roughly 6 months ago.   Past Medical History:  Diagnosis Date  . Anxiety   . CAD (coronary artery disease)   . HLD (hyperlipidemia)   . Hypertension   . Thrombocytopenia ([place])    Family History  Problem Relation Age of Onset  . CAD Mother   . Hypertension Mother   . Cancer Father   . CAD Father    Past Surgical History:  Procedure Laterality Date  . COLONOSCOPY WITH PROPOFOL N/A 12/12/2015   Procedure: COLONOSCOPY WITH PROPOFOL;  Surgeon: Manya Silvas, MD;  Location: [facility] ENDOSCOPY;  Service: Endoscopy;  Laterality: N/A;  . CORONARY STENT INTERVENTION N/A 02/24/2017   Procedure: Coronary Stent Intervention;  Surgeon: Isaias Cowman, MD;  Location: [place] CV LAB;  Service: Cardiovascular;  Laterality: N/A;  . LEFT HEART CATH AND CORONARY ANGIOGRAPHY Left 02/24/2017   Procedure: Left Heart Cath and Coronary Angiography;  Surgeon: Isaias Cowman, MD;  Location: [place] CV LAB;  Service: Cardiovascular;  Laterality: Left;  . stents     Patient Active Problem List   Diagnosis  Date Noted  . Ischemic chest pain ([place]) 11/10/2018  . HTN (hypertension) 11/10/2018  . HLD (hyperlipidemia) 11/10/2018  . Anxiety 11/10/2018  . Chest pain 11/10/2018  . CAD (coronary artery disease) 02/24/2017      Prior to Admission medications   Medication Sig Start Date End Date Taking? Authorizing Provider  aspirin 81 MG tablet Take 81 mg by mouth daily.    Yes [provider]  clopidogrel (PLAVIX) 75 MG tablet Take 75 mg by mouth daily. 08/26/18  Yes [provider]  fluticasone (FLONASE) 50 MCG/ACT nasal spray Place 1 spray into both nostrils daily as needed for allergies or rhinitis.   Yes [provider]  loratadine (CLARITIN) 10 MG tablet Take 10 mg by mouth every other day. 04/15/18  Yes [provider]  losartan-hydrochlorothiazide (HYZAAR) 100-25 MG tablet Take 1 tablet by mouth daily. 06/14/18  Yes [provider]  Magnesium Oxide 400 (240 MG) MG TABS One tab once per day Patient taking differently: Take 400 mg by mouth daily. One tab once per day 01/22/15  Yes Lisa Roca, MD  metoprolol succinate (TOPROL-XL) 50 MG 24 hr tablet Take 50 mg by mouth daily. Take with or immediately following a meal.   Yes [provider]  potassium chloride (K-DUR,KLOR-CON) 10 MEQ tablet Take 10 mEq by mouth daily.   Yes [provider]  zolpidem (AMBIEN) 5 MG tablet Take 5 mg by  mouth at bedtime as needed.    Yes [provider]  SUMAtriptan (IMITREX) 50 MG tablet Take 50 mg by mouth every 2 (two) hours as needed for migraine. May repeat in 2 hours if headache persists or recurs.    [provider]    Allergies Lopid [gemfibrozil] and Statins    Social History Social History   Tobacco Use  . Smoking status: Current Every Day Smoker    Packs/day: 0.25    Years: 25.00    Pack years: 6.25    Types: Cigarettes  . Smokeless tobacco: Never Used  Substance Use Topics  . Alcohol use: Yes    Alcohol/week: 8.0  standard drinks    Types: 8 Cans of beer per week  . Drug use: No    Review of Systems Patient denies headaches, rhinorrhea, blurry vision, numbness, shortness of breath, chest pain, edema, cough, abdominal pain, nausea, vomiting, diarrhea, dysuria, fevers, rashes or hallucinations unless otherwise stated above in HPI. ____________________________________________   PHYSICAL EXAM:  VITAL SIGNS: Vitals:   11/10/18 2239 11/10/18 2245  BP: (!) 173/105 (!) 142/86  Pulse: 84 85  Resp:    Temp:    SpO2: 99% 98%    Constitutional: Alert and oriented.  Eyes: Conjunctivae are normal.  Head: Atraumatic. Nose: No congestion/rhinnorhea. Mouth/Throat: Mucous membranes are moist.   Neck: No stridor. Painless ROM.  Cardiovascular: Normal rate, regular rhythm. Grossly normal heart sounds.  Good peripheral circulation. Respiratory: Normal respiratory effort.  No retractions. Lungs CTAB. Gastrointestinal: Soft and nontender. No distention. No abdominal bruits. No CVA tenderness. Genitourinary:  Musculoskeletal: No lower extremity tenderness nor edema.  No joint effusions. Neurologic:  Normal speech and language. No gross focal neurologic deficits are appreciated. No facial droop Skin:  Skin is warm, dry and intact. No rash noted. Psychiatric: Mood and affect are normal. Speech and behavior are normal.  ____________________________________________   LABS (all labs ordered are listed, but only abnormal results are displayed)  Results for orders placed or performed during the hospital encounter of 11/10/18 (from the past 24 hour(s))  Troponin I - ONCE - STAT     Status: None   Collection Time: 11/10/18  8:28 PM  Result Value Ref Range   Troponin I <0.03 <0.03 ng/mL  CBC with Differential/Platelet     Status: None   Collection Time: 11/10/18  8:28 PM  Result Value Ref Range   WBC 8.6 4.0 - 10.5 K/uL   RBC 5.22 4.22 - 5.81 MIL/uL   Hemoglobin 16.9 13.0 - 17.0 g/dL   HCT 47.9 39.0 - 52.0  %   MCV 91.8 80.0 - 100.0 fL   MCH 32.4 26.0 - 34.0 pg   MCHC 35.3 30.0 - 36.0 g/dL   RDW 13.0 11.5 - 15.5 %   Platelets 158 150 - 400 K/uL   nRBC 0.0 0.0 - 0.2 %   Neutrophils Relative % 45 %   Neutro Abs 3.9 1.7 - 7.7 K/uL   Lymphocytes Relative 42 %   Lymphs Abs 3.7 0.7 - 4.0 K/uL   Monocytes Relative 10 %   Monocytes Absolute 0.9 0.1 - 1.0 K/uL   Eosinophils Relative 2 %   Eosinophils Absolute 0.1 0.0 - 0.5 K/uL   Basophils Relative 1 %   Basophils Absolute 0.0 0.0 - 0.1 K/uL   Immature Granulocytes 0 %   Abs Immature Granulocytes 0.02 0.00 - 0.07 K/uL  Comprehensive metabolic panel     Status: Abnormal   Collection Time: 11/10/18  8:28 PM  Result Value Ref Range   Sodium 141 135 - 145 mmol/L   Potassium 2.9 (L) 3.5 - 5.1 mmol/L   Chloride 100 98 - 111 mmol/L   CO2 29 22 - 32 mmol/L   Glucose, Bld 148 (H) 70 - 99 mg/dL   BUN 13 6 - 20 mg/dL   Creatinine, Ser 0.98 0.61 - 1.24 mg/dL   Calcium 9.2 8.9 - 10.3 mg/dL   Total Protein 7.4 6.5 - 8.1 g/dL   Albumin 4.4 3.5 - 5.0 g/dL   AST 32 15 - 41 U/L   ALT 52 (H) 0 - 44 U/L   Alkaline Phosphatase 70 38 - 126 U/L   Total Bilirubin 1.5 (H) 0.3 - 1.2 mg/dL   GFR calc non Af Amer >60 >60 mL/min   GFR calc Af Amer >60 >60 mL/min   Anion gap 12 5 - 15  Protime-INR     Status: None   Collection Time: 11/10/18  8:28 PM  Result Value Ref Range   Prothrombin Time 12.8 11.4 - 15.2 seconds   INR 0.97   APTT     Status: None   Collection Time: 11/10/18  8:28 PM  Result Value Ref Range   aPTT 32 24 - 36 seconds  Troponin I - Now Then Q6H     Status: Abnormal   Collection Time: 11/10/18 10:39 PM  Result Value Ref Range   Troponin I 0.06 (HH) <0.03 ng/mL   ____________________________________________  EKG My review and personal interpretation at Time: 20:29   Indication: chest pain  Rate: 70  Rhythm: sinus Axis: normal Other: normal intervals, inferolateral st depression concerning for acute ischemia, does not meet STEMI  criteria ____________________________________________  RADIOLOGY  I personally reviewed all radiographic images ordered to evaluate for the above acute complaints and reviewed radiology reports and findings.  These findings were personally discussed with the patient.  Please see medical record for radiology report.  ____________________________________________   PROCEDURES  Procedure(s) performed:  .Critical Care Performed by: Merlyn Lot, MD Authorized by: Merlyn Lot, MD   Critical care provider statement:    Critical care time (minutes):  30   Critical care time was exclusive of:  Separately billable procedures and treating other patients   Critical care was necessary to treat or prevent imminent or life-threatening deterioration of the following conditions:  Cardiac failure   Critical care was time spent personally by me on the following activities:  Development of treatment plan with patient or surrogate, discussions with consultants, evaluation of patient's response to treatment, examination of patient, obtaining history from patient or surrogate, ordering and performing treatments and interventions, ordering and review of laboratory studies, ordering and review of radiographic studies, pulse oximetry, re-evaluation of patient's condition and review of old charts      Critical Care performed: yes ____________________________________________   INITIAL IMPRESSION / [place] / ED COURSE  Pertinent labs & imaging results that were available during my care of the patient were reviewed by me and considered in my medical decision making (see chart for details).   DDX: ACS, pericarditis, esophagitis, boerhaaves, pe, dissection, pna, bronchitis, costochondritis   Temiloluwa L Bradly is a 53 y.o. who presents to the ED with symptoms as described above.  Initial EKGs via EMS certainly are concerning for ischemia but did not quite meet criteria for STEMI.  Patient  arrives now with significant improvement in discomfort.  Denies any pain.  Did have pain improved after nitroglycerin.  Patient received aspirin.  Will order chest x-ray as well as initial blood work.  Anticipate admission to the hospital due to concerning EKG findings.  Will repeat EEG to evaluate for any dynamic changes.  Clinical Course as of Nov 10 2350  Thu Nov 10, 2018  2119 Initial troponin is negative.  Patient is having dynamic changes on his EKG however and given his description of symptoms I do believe the patient would benefit from admission the hospital due to concern for ACS for serial enzymes and cardiac evaluation.   [PR]    Clinical Course User Index [PR] Merlyn Lot, MD     As part of my medical decision making, I reviewed the following data within the [place] notes reviewed and incorporated, Labs reviewed, notes from prior ED visits and [place] Controlled Substance Database   ____________________________________________   FINAL CLINICAL IMPRESSION(S) / ED DIAGNOSES  Final diagnoses:  Chest pain, unspecified type  Abnormal EKG      NEW MEDICATIONS STARTED DURING THIS VISIT:  Current Discharge Medication List       Note:  This document was prepared using Dragon voice recognition software and may include unintentional dictation errors.    Merlyn Lot, MD 11/10/18 2352

## 2018-11-10 NOTE — ED Notes (Signed)
ED TO INPATIENT HANDOFF REPORT  Name/Age/Gender [name] 53 y.o. male  Code Status Code Status History    Date Active Date Inactive Code Status Order ID Comments User Context   02/24/2017 1000 02/25/2017 1410 Full Code [id number]  Isaias Cowman, MD Inpatient      Home/SNF/Other Home  Chief Complaint chest pain  Level of Care/Admitting Diagnosis ED Disposition    ED Disposition Condition [place]: [place] [100120]  Level of Care: Telemetry [5]  Diagnosis: Chest pain [789381]  Admitting Physician: Lance Coon [[id number]]  Attending Physician: Lance Coon [[id number]]  Bed request comments: 2a  PT Class (Do Not Modify): Observation [104]  PT Acc Code (Do Not Modify): Observation [10022]       Medical History Past Medical History:  Diagnosis Date  . Anxiety   . CAD (coronary artery disease)   . HLD (hyperlipidemia)   . Hypertension   . Thrombocytopenia ([facility])     Allergies Allergies  Allergen Reactions  . Lopid [Gemfibrozil]   . Statins Other (See Comments)    Pain and cramps    IV Location/Drains/Wounds Patient Lines/Drains/Airways Status   Active Line/Drains/Airways    Name:   Placement date:   Placement time:   Site:   Days:   Peripheral IV 02/24/17 Left Antecubital   02/24/17    0800    Antecubital   624   Peripheral IV 11/10/18 Left Antecubital   11/10/18    2027    Antecubital   less than 1   Airway   12/12/15    1133     1064          Labs/Imaging Results for orders placed or performed during the hospital encounter of 11/10/18 (from the past 48 hour(s))  Troponin I - ONCE - STAT     Status: None   Collection Time: 11/10/18  8:28 PM  Result Value Ref Range   Troponin I <0.03 <0.03 ng/mL    Comment: Performed at [facility], [address] [place], [place] [address]  CBC with Differential/Platelet     Status: None   Collection Time: 11/10/18  8:28 PM  Result Value Ref Range   WBC 8.6 4.0 - 10.5 K/uL   RBC 5.22 4.22 - 5.81 MIL/uL   Hemoglobin 16.9 13.0 - 17.0 g/dL   HCT 47.9 39.0 - 52.0 %   MCV 91.8 80.0 - 100.0 fL   MCH 32.4 26.0 - 34.0 pg   MCHC 35.3 30.0 - 36.0 g/dL   RDW 13.0 11.5 - 15.5 %   Platelets 158 150 - 400 K/uL   nRBC 0.0 0.0 - 0.2 %   Neutrophils Relative % 45 %   Neutro Abs 3.9 1.7 - 7.7 K/uL   Lymphocytes Relative 42 %   Lymphs Abs 3.7 0.7 - 4.0 K/uL   Monocytes Relative 10 %   Monocytes Absolute 0.9 0.1 - 1.0 K/uL   Eosinophils Relative 2 %   Eosinophils Absolute 0.1 0.0 - 0.5 K/uL   Basophils Relative 1 %   Basophils Absolute 0.0 0.0 - 0.1 K/uL   Immature Granulocytes 0 %   Abs Immature Granulocytes 0.02 0.00 - 0.07 K/uL    Comment: Performed at [facility], [address] [place], [place] [address]  Comprehensive metabolic panel     Status: Abnormal   Collection Time: 11/10/18  8:28 PM  Result Value Ref Range   Sodium 141 135 - 145 mmol/L  Potassium 2.9 (L) 3.5 - 5.1 mmol/L   Chloride 100 98 - 111 mmol/L   CO2 29 22 - 32 mmol/L   Glucose, Bld 148 (H) 70 - 99 mg/dL   BUN 13 6 - 20 mg/dL   Creatinine, Ser 0.98 0.61 - 1.24 mg/dL   Calcium 9.2 8.9 - 10.3 mg/dL   Total Protein 7.4 6.5 - 8.1 g/dL   Albumin 4.4 3.5 - 5.0 g/dL   AST 32 15 - 41 U/L   ALT 52 (H) 0 - 44 U/L   Alkaline Phosphatase 70 38 - 126 U/L   Total Bilirubin 1.5 (H) 0.3 - 1.2 mg/dL   GFR calc non Af Amer >60 >60 mL/min   GFR calc Af Amer >60 >60 mL/min   Anion gap 12 5 - 15    Comment: Performed at [facility], [address] [place], [place] [address]  Protime-INR     Status: None   Collection Time: 11/10/18  8:28 PM  Result Value Ref Range   Prothrombin Time 12.8 11.4 - 15.2 seconds   INR 0.97     Comment: Performed at [facility], [address] [place], [place] [address]  APTT     Status: None   Collection Time: 11/10/18  8:28 PM  Result Value Ref Range   aPTT 32 24 - 36 seconds    Comment: Performed at [facility], [address], [place], [place] [address]   Dg Chest Portable 1 View  Result Date: 11/10/2018 CLINICAL DATA:  Left-sided chest pain. EXAM: PORTABLE CHEST 1 VIEW COMPARISON:  01/22/2015 FINDINGS: The cardiac silhouette is upper limits of normal in size. The lungs are mildly hypoinflated without evidence of airspace consolidation, edema, pleural effusion, or pneumothorax. No acute osseous abnormality is seen. IMPRESSION: No active disease. Electronically Signed   By: Logan Bores M.D.   On: 11/10/2018 20:52    Pending Labs Unresulted Labs (From admission, onward)    Start     Ordered   11/11/18 0500  CBC  Tomorrow morning,   STAT     11/10/18 2137   Signed and Held  HIV antibody (Routine Testing)  Once,   R     Signed and Held   Signed and Held  Troponin I - Now Then Q6H  Now then every 6 hours,   R     Signed and Held   Signed and Held  Basic metabolic panel  Tomorrow morning,   R     Signed and Held   Signed and Held  CBC  Tomorrow morning,   R     Signed and Held          Vitals/Pain Today's Vitals   11/10/18 2100 11/10/18 2115 11/10/18 2130 11/10/18 2145  BP: (!) 142/83 139/86 (!) 144/81 (!) 146/85  Pulse: 65 65 67 73  Resp: 11 10 12 14   Temp:      SpO2: 98% 98% 96% 97%  Weight:      Height:      PainSc:        Isolation Precautions No active isolations  Medications Medications  heparin ADULT infusion 100 units/mL (25000 units/248mL sodium chloride 0.45%) (900 Units/hr Intravenous New Bag/Given 11/10/18 2145)  heparin bolus via infusion 4,000 Units (4,000 Units Intravenous Bolus from Bag 11/10/18 2146)    Mobility walks

## 2018-11-10 NOTE — ED Notes (Signed)
Report has been given to the floor at this time , pt awaiting transport

## 2018-11-10 NOTE — ED Notes (Signed)
Called floor at this time to give report .

## 2018-11-10 NOTE — ED Triage Notes (Signed)
Pt here from home , with chest pain left side , hx of passed mi , nitro and line placed in route

## 2018-11-10 NOTE — H&P (Signed)
[facility] NAME: [name]    MR#:  [id number]  [place]:  03-18-1966  DATE OF ADMISSION:  11/10/2018  PRIMARY CARE PHYSICIAN: Cletis Athens, MD   REQUESTING/REFERRING PHYSICIAN: Quentin Cornwall, MD  CHIEF COMPLAINT:   Chief Complaint  Patient presents with  . Chest Pain    HISTORY OF PRESENT ILLNESS:  [place] Blizzard  is a 53 y.o. male who presents with chief complaint as above.  Patient presents the ED with a complaint of chest pain since last night.  He states he is pain-free at this time.  He does state that he was also having some neck pain for the past week or so that he thought was just from a pulled muscle.  However, on work-up here in the ED he has some dynamic EKG changes.  He does have a past history of significant cardiac disease including prior stenting and subsequent in-stent restenosis which required catheterization as well.  He was started on heparin by ED physician and hospitalist were called for admission  PAST MEDICAL HISTORY:   Past Medical History:  Diagnosis Date  . Anxiety   . CAD (coronary artery disease)   . HLD (hyperlipidemia)   . Hypertension   . Thrombocytopenia ([place])      PAST SURGICAL HISTORY:   Past Surgical History:  Procedure Laterality Date  . COLONOSCOPY WITH PROPOFOL N/A 12/12/2015   Procedure: COLONOSCOPY WITH PROPOFOL;  Surgeon: Manya Silvas, MD;  Location: [facility] ENDOSCOPY;  Service: Endoscopy;  Laterality: N/A;  . CORONARY STENT INTERVENTION N/A 02/24/2017   Procedure: Coronary Stent Intervention;  Surgeon: Isaias Cowman, MD;  Location: [facility] CV LAB;  Service: Cardiovascular;  Laterality: N/A;  . LEFT HEART CATH AND CORONARY ANGIOGRAPHY Left 02/24/2017   Procedure: Left Heart Cath and Coronary Angiography;  Surgeon: Isaias Cowman, MD;  Location: [place] LAB;  Service: Cardiovascular;  Laterality: Left;  . stents       SOCIAL HISTORY:   Social  History   Tobacco Use  . Smoking status: Current Every Day Smoker    Packs/day: 0.25    Years: 25.00    Pack years: 6.25    Types: Cigarettes  . Smokeless tobacco: Never Used  Substance Use Topics  . Alcohol use: Yes    Alcohol/week: 8.0 standard drinks    Types: 8 Cans of beer per week     FAMILY HISTORY:   Family History  Problem Relation Age of Onset  . CAD Mother   . Hypertension Mother   . Cancer Father   . CAD Father      DRUG ALLERGIES:   Allergies  Allergen Reactions  . Lopid [Gemfibrozil]   . Statins Other (See Comments)    Pain and cramps    MEDICATIONS AT HOME:   Prior to Admission medications   Medication Sig Start Date End Date Taking? Authorizing Provider  aspirin 325 MG EC tablet Take 325 mg by mouth daily.    [provider]  fluticasone (FLONASE) 50 MCG/ACT nasal spray Place 1 spray into both nostrils daily as needed for allergies or rhinitis.    [provider]  isosorbide mononitrate (IMDUR) 30 MG 24 hr tablet Take 30 mg by mouth daily.    [provider]  Magnesium Oxide 400 (240 MG) MG TABS One tab once per day Patient taking differently: Take 400 mg by mouth daily. One tab once per day 01/22/15   Lisa Roca,  MD  metoprolol succinate (TOPROL-XL) 50 MG 24 hr tablet Take 50 mg by mouth daily. Take with or immediately following a meal.    [provider]  potassium chloride (K-DUR,KLOR-CON) 10 MEQ tablet Take 10 mEq by mouth daily.    [provider]  SUMAtriptan (IMITREX) 50 MG tablet Take 50 mg by mouth every 2 (two) hours as needed for migraine. May repeat in 2 hours if headache persists or recurs.    [provider]  valsartan-hydrochlorothiazide (DIOVAN-HCT) 320-25 MG tablet Take 1 tablet by mouth daily.    [provider]  zolpidem (AMBIEN) 5 MG tablet Take 5 mg by mouth at bedtime as needed.     [provider]    REVIEW OF SYSTEMS:  Review of Systems   Constitutional: Negative for chills, fever, malaise/fatigue and weight loss.  HENT: Negative for ear pain, hearing loss and tinnitus.   Eyes: Negative for blurred vision, double vision, pain and redness.  Respiratory: Negative for cough, hemoptysis and shortness of breath.   Cardiovascular: Positive for chest pain. Negative for palpitations, orthopnea and leg swelling.  Gastrointestinal: Negative for abdominal pain, constipation, diarrhea, nausea and vomiting.  Genitourinary: Negative for dysuria, frequency and hematuria.  Musculoskeletal: Negative for back pain, joint pain and neck pain.  Skin:       No acne, rash, or lesions  Neurological: Negative for dizziness, tremors, focal weakness and weakness.  Endo/Heme/Allergies: Negative for polydipsia. Does not bruise/bleed easily.  Psychiatric/Behavioral: Negative for depression. The patient is not nervous/anxious and does not have insomnia.      VITAL SIGNS:   Vitals:   11/10/18 2024 11/10/18 2025  BP: (!) 167/90   Pulse: 67   Temp: 98.8 F (37.1 C)   SpO2: 97%   Weight:  77.1 kg  Height:  5\' 9"  (1.753 m)   Wt Readings from Last 3 Encounters:  11/10/18 77.1 kg  02/24/17 76.2 kg  12/12/15 77.1 kg    PHYSICAL EXAMINATION:  Physical Exam  Vitals reviewed. Constitutional: He is oriented to person, place, and time. He appears well-developed and well-nourished. No distress.  HENT:  Head: Normocephalic and atraumatic.  Mouth/Throat: Oropharynx is clear and moist.  Eyes: Pupils are equal, round, and reactive to light. Conjunctivae and EOM are normal. No scleral icterus.  Neck: Normal range of motion. Neck supple. No JVD present. No thyromegaly present.  Cardiovascular: Normal rate, regular rhythm and intact distal pulses. Exam reveals no gallop and no friction rub.  No murmur heard. Respiratory: Effort normal and breath sounds normal. No respiratory distress. He has no wheezes. He has no rales.  GI: Soft. Bowel sounds are normal.  He exhibits no distension. There is no abdominal tenderness.  Musculoskeletal: Normal range of motion.        General: No edema.     Comments: No arthritis, no gout  Lymphadenopathy:    He has no cervical adenopathy.  Neurological: He is alert and oriented to person, place, and time. No cranial nerve deficit.  No dysarthria, no aphasia  Skin: Skin is warm and dry. No rash noted. No erythema.  Psychiatric: He has a normal mood and affect. His behavior is normal. Judgment and thought content normal.    LABORATORY PANEL:   CBC Recent Labs  Lab 11/10/18 2028  WBC 8.6  HGB 16.9  HCT 47.9  PLT 158   ------------------------------------------------------------------------------------------------------------------  Chemistries  Recent Labs  Lab 11/10/18 2028  NA 141  K 2.9*  CL 100  CO2  29  GLUCOSE 148*  BUN 13  CREATININE 0.98  CALCIUM 9.2  AST 32  ALT 52*  ALKPHOS 70  BILITOT 1.5*   ------------------------------------------------------------------------------------------------------------------  Cardiac Enzymes Recent Labs  Lab 11/10/18 2028  TROPONINI <0.03   ------------------------------------------------------------------------------------------------------------------  RADIOLOGY:  Dg Chest Portable 1 View  Result Date: 11/10/2018 CLINICAL DATA:  Left-sided chest pain. EXAM: PORTABLE CHEST 1 VIEW COMPARISON:  01/22/2015 FINDINGS: The cardiac silhouette is upper limits of normal in size. The lungs are mildly hypoinflated without evidence of airspace consolidation, edema, pleural effusion, or pneumothorax. No acute osseous abnormality is seen. IMPRESSION: No active disease. Electronically Signed   By: Logan Bores M.D.   On: 11/10/2018 20:52    EKG:   Orders placed or performed during the hospital encounter of 11/10/18  . EKG 12-Lead  . EKG 12-Lead  . EKG 12-Lead  . EKG 12-Lead  . EKG 12-Lead  . EKG 12-Lead  . EKG 12-Lead  . EKG 12-Lead     IMPRESSION AND PLAN:  Principal Problem:   Ischemic chest pain ([place]) -patient started on heparin drip as per HPI.  We will continue this for now, trend his troponins tonight, first troponin was negative.  Get echocardiogram and a cardiology consult Active Problems:   CAD (coronary artery disease) -continue home meds, work-up as above   HTN (hypertension) -continue home medications   HLD (hyperlipidemia) -home dose antilipid   Anxiety -home dose anxiolytic  Chart review performed and case discussed with ED provider. Labs, imaging and/or ECG reviewed by provider and discussed with patient/family. Management plans discussed with the patient and/or family.  DVT PROPHYLAXIS: Systemic anticoagulation  GI PROPHYLAXIS:  None  ADMISSION STATUS: Observation  CODE STATUS: Full Code Status History    Date Active Date Inactive Code Status Order ID Comments User Context   02/24/2017 1000 02/25/2017 1410 Full Code [id number]  Isaias Cowman, MD Inpatient      TOTAL TIME TAKING CARE OF THIS PATIENT: 40 minutes.   Ethlyn Daniels 11/10/2018, 9:34 PM  [facility]  Office  [phone]  CC: Primary care physician; Cletis Athens, MD  Note:  This document was prepared using Dragon voice recognition software and may include unintentional dictation errors.

## 2018-11-10 NOTE — Progress Notes (Signed)
ANTICOAGULATION CONSULT NOTE - Initial Consult  Pharmacy Consult for Heparin  Indication: chest pain/ACS  Allergies  Allergen Reactions  . Lopid [Gemfibrozil]   . Statins Other (See Comments)    Pain and cramps    Patient Measurements: Height: 5\' 9"  (175.3 cm) Weight: 170 lb (77.1 kg) IBW/kg (Calculated) : 70.7 Heparin Dosing Weight:  77.1 kg   Vital Signs: Temp: 98.8 F (37.1 C) (02/20 2024) BP: 167/90 (02/20 2024) Pulse Rate: 67 (02/20 2024)  Labs: Recent Labs    11/10/18 2028  HGB 16.9  HCT 47.9  PLT 158  CREATININE 0.98  TROPONINI <0.03    Estimated Creatinine Clearance: 86.2 mL/min (by C-G formula based on SCr of 0.98 mg/dL).   Medical History: Past Medical History:  Diagnosis Date  . Anxiety   . CAD (coronary artery disease)   . HLD (hyperlipidemia)   . Hypertension   . Thrombocytopenia ([place])     Medications:  (Not in a hospital admission)   Assessment: Pharmacy consulted to dose heparin in this 53 year old male admitted with ACS/NSTEMI.  No prior anticoag noted. CrCl = 86.2 ml/min   Goal of Therapy:  Heparin level 0.3-0.7 units/ml Monitor platelets by anticoagulation protocol: Yes   Plan:  Will draw baseline APTT and INR.  Will order Heparin 4000 units IV X 1 bolus and start heparin drip @ 900 units/hr.  Will draw 1st HL 6 hrs after start of drip.  Will check CBC/HL daily.   Gusta Marksberry D 11/10/2018,9:33 PM

## 2018-11-11 ENCOUNTER — Encounter
Admission: EM | Disposition: A | Discharge status: Home / Self Care | Payer: Self-pay | Source: Home / Self Care | Attending: Family Medicine

## 2018-11-11 ENCOUNTER — Observation Stay
Admit: 2018-11-11 | Discharge: 2018-11-11 | Disposition: A | Discharge status: Home / Self Care | Payer: No Typology Code available for payment source | Attending: Internal Medicine | Admitting: Internal Medicine

## 2018-11-11 DIAGNOSIS — Z79899 Other long term (current) drug therapy: Secondary | ICD-10-CM | POA: Diagnosis not present

## 2018-11-11 DIAGNOSIS — Z888 Allergy status to other drugs, medicaments and biological substances status: Secondary | ICD-10-CM | POA: Diagnosis not present

## 2018-11-11 DIAGNOSIS — Z8249 Family history of ischemic heart disease and other diseases of the circulatory system: Secondary | ICD-10-CM | POA: Diagnosis not present

## 2018-11-11 DIAGNOSIS — Z955 Presence of coronary angioplasty implant and graft: Secondary | ICD-10-CM | POA: Diagnosis not present

## 2018-11-11 DIAGNOSIS — E785 Hyperlipidemia, unspecified: Secondary | ICD-10-CM | POA: Diagnosis present

## 2018-11-11 DIAGNOSIS — Z7982 Long term (current) use of aspirin: Secondary | ICD-10-CM | POA: Diagnosis not present

## 2018-11-11 DIAGNOSIS — I25110 Atherosclerotic heart disease of native coronary artery with unstable angina pectoris: Secondary | ICD-10-CM | POA: Diagnosis present

## 2018-11-11 DIAGNOSIS — R079 Chest pain, unspecified: Secondary | ICD-10-CM | POA: Diagnosis present

## 2018-11-11 DIAGNOSIS — F17210 Nicotine dependence, cigarettes, uncomplicated: Secondary | ICD-10-CM | POA: Diagnosis present

## 2018-11-11 DIAGNOSIS — F419 Anxiety disorder, unspecified: Secondary | ICD-10-CM | POA: Diagnosis present

## 2018-11-11 DIAGNOSIS — I10 Essential (primary) hypertension: Secondary | ICD-10-CM | POA: Diagnosis present

## 2018-11-11 HISTORY — PX: LEFT HEART CATH AND CORONARY ANGIOGRAPHY: CATH118249

## 2018-11-11 HISTORY — PX: CORONARY STENT INTERVENTION: CATH118234

## 2018-11-11 LAB — CBC
HCT: 45 % (ref 39.0–52.0)
Hemoglobin: 16.2 g/dL (ref 13.0–17.0)
MCH: 32.8 pg (ref 26.0–34.0)
MCHC: 36 g/dL (ref 30.0–36.0)
MCV: 91.1 fL (ref 80.0–100.0)
Platelets: 153 10*3/uL (ref 150–400)
RBC: 4.94 MIL/uL (ref 4.22–5.81)
RDW: 13 % (ref 11.5–15.5)
WBC: 8 10*3/uL (ref 4.0–10.5)
nRBC: 0 % (ref 0.0–0.2)

## 2018-11-11 LAB — BASIC METABOLIC PANEL
Anion gap: 7 (ref 5–15)
BUN: 12 mg/dL (ref 6–20)
CO2: 24 mmol/L (ref 22–32)
Calcium: 8.8 mg/dL — ABNORMAL LOW (ref 8.9–10.3)
Chloride: 110 mmol/L (ref 98–111)
Creatinine, Ser: 0.63 mg/dL (ref 0.61–1.24)
GFR calc Af Amer: >60 mL/min (ref >60)
GFR calc non Af Amer: >60 mL/min (ref >60)
GLUCOSE: 99 mg/dL (ref 70–99)
Potassium: 3.5 mmol/L (ref 3.5–5.1)
Sodium: 141 mmol/L (ref 135–145)

## 2018-11-11 LAB — TROPONIN I
Troponin I: 0.11 ng/mL (ref <0.03)
Troponin I: 0.11 ng/mL (ref <0.03)

## 2018-11-11 LAB — ECHOCARDIOGRAM COMPLETE
Height: 69 in
Weight: 2720 oz

## 2018-11-11 LAB — POCT ACTIVATED CLOTTING TIME: Activated Clotting Time: 290 seconds

## 2018-11-11 LAB — HEPARIN LEVEL (UNFRACTIONATED)
Heparin Unfractionated: 0.14 IU/mL — ABNORMAL LOW (ref 0.30–0.70)
Heparin Unfractionated: 0.48 IU/mL (ref 0.30–0.70)

## 2018-11-11 LAB — CARDIAC CATHETERIZATION: Cath EF Quantitative: 60 %

## 2018-11-11 SURGERY — LEFT HEART CATH AND CORONARY ANGIOGRAPHY
Anesthesia: Moderate Sedation

## 2018-11-11 MED ORDER — NITROGLYCERIN IN D5W 200-5 MCG/ML-% IV SOLN: 0.0000 ug/min | INTRAVENOUS | Status: DC

## 2018-11-11 MED ORDER — HYDROCHLOROTHIAZIDE 25 MG PO TABS
25.0000 mg | ORAL_TABLET | Freq: Every day | ORAL | Status: DC
  Administered 2018-11-11: 25 mg via ORAL
  Filled 2018-11-11 (×3): qty 1

## 2018-11-11 MED ORDER — NITROGLYCERIN 5 MG/ML IV SOLN
INTRAVENOUS | Status: AC
  Filled 2018-11-11: qty 10

## 2018-11-11 MED ORDER — LABETALOL HCL 5 MG/ML IV SOLN: 10.0000 mg | INTRAVENOUS | Status: AC | PRN

## 2018-11-11 MED ORDER — NITROGLYCERIN 0.4 MG SL SUBL
SUBLINGUAL_TABLET | SUBLINGUAL | Status: AC
  Filled 2018-11-11: qty 1

## 2018-11-11 MED ORDER — ONDANSETRON HCL 4 MG/2ML IJ SOLN: 4.0000 mg | Freq: Four times a day (QID) | INTRAMUSCULAR | Status: DC | PRN

## 2018-11-11 MED ORDER — SODIUM CHLORIDE 0.9% FLUSH: 3.0000 mL | Freq: Two times a day (BID) | INTRAVENOUS | Status: DC

## 2018-11-11 MED ORDER — CLOPIDOGREL BISULFATE 75 MG PO TABS
ORAL_TABLET | ORAL | Status: AC
  Filled 2018-11-11: qty 1

## 2018-11-11 MED ORDER — MIDAZOLAM HCL 2 MG/2ML IJ SOLN
INTRAMUSCULAR | Status: AC
  Filled 2018-11-11: qty 2

## 2018-11-11 MED ORDER — FENTANYL CITRATE (PF) 100 MCG/2ML IJ SOLN
INTRAMUSCULAR | Status: AC
  Filled 2018-11-11: qty 2

## 2018-11-11 MED ORDER — METOPROLOL TARTRATE 5 MG/5ML IV SOLN
INTRAVENOUS | Status: DC | PRN
  Administered 2018-11-11: 5 mg via INTRAVENOUS
  Administered 2018-11-11 (×2): 2.5 mg via INTRAVENOUS

## 2018-11-11 MED ORDER — METOPROLOL TARTRATE 5 MG/5ML IV SOLN
INTRAVENOUS | Status: AC
  Filled 2018-11-11: qty 5

## 2018-11-11 MED ORDER — HEPARIN BOLUS VIA INFUSION
2300.0000 [IU] | Freq: Once | INTRAVENOUS | Status: AC
  Administered 2018-11-11: 2300 [IU] via INTRAVENOUS
  Filled 2018-11-11: qty 2300

## 2018-11-11 MED ORDER — SODIUM CHLORIDE 0.9% FLUSH: 3.0000 mL | INTRAVENOUS | Status: DC | PRN

## 2018-11-11 MED ORDER — SODIUM CHLORIDE 0.9 % WEIGHT BASED INFUSION
1.0000 mL/kg/h | INTRAVENOUS | Status: AC
  Administered 2018-11-11: 1 mL/kg/h via INTRAVENOUS

## 2018-11-11 MED ORDER — ASPIRIN 81 MG PO CHEW: 81.0000 mg | CHEWABLE_TABLET | ORAL | Status: DC

## 2018-11-11 MED ORDER — SODIUM CHLORIDE 0.9 % WEIGHT BASED INFUSION: 3.0000 mL/kg/h | INTRAVENOUS | Status: DC

## 2018-11-11 MED ORDER — HYDRALAZINE HCL 20 MG/ML IJ SOLN: 5.0000 mg | INTRAMUSCULAR | Status: AC | PRN

## 2018-11-11 MED ORDER — MIDAZOLAM HCL 2 MG/2ML IJ SOLN
INTRAMUSCULAR | Status: DC | PRN
  Administered 2018-11-11 (×3): 1 mg via INTRAVENOUS

## 2018-11-11 MED ORDER — IOPAMIDOL (ISOVUE-300) INJECTION 61%
INTRAVENOUS | Status: DC | PRN
  Administered 2018-11-11: 350 mL via INTRA_ARTERIAL

## 2018-11-11 MED ORDER — SODIUM CHLORIDE 0.9 % WEIGHT BASED INFUSION
1.0000 mL/kg/h | INTRAVENOUS | Status: DC
  Administered 2018-11-11: 1 mL/kg/h via INTRAVENOUS

## 2018-11-11 MED ORDER — SODIUM CHLORIDE 0.9 % WEIGHT BASED INFUSION: 3.0000 mL/kg/h | INTRAVENOUS | Status: AC

## 2018-11-11 MED ORDER — VERAPAMIL HCL 2.5 MG/ML IV SOLN
INTRAVENOUS | Status: AC
  Filled 2018-11-11: qty 2

## 2018-11-11 MED ORDER — HEPARIN SODIUM (PORCINE) 1000 UNIT/ML IJ SOLN
INTRAMUSCULAR | Status: DC | PRN
  Administered 2018-11-11 (×2): 4000 [IU] via INTRAVENOUS

## 2018-11-11 MED ORDER — ASPIRIN 81 MG PO CHEW
81.0000 mg | CHEWABLE_TABLET | Freq: Every day | ORAL | Status: DC
  Administered 2018-11-12: 81 mg via ORAL
  Filled 2018-11-11: qty 1

## 2018-11-11 MED ORDER — NITROGLYCERIN 1 MG/10 ML FOR IR/CATH LAB
INTRA_ARTERIAL | Status: DC | PRN
  Administered 2018-11-11 (×2): 200 ug via INTRACORONARY

## 2018-11-11 MED ORDER — SODIUM CHLORIDE 0.9 % IV SOLN: 250.0000 mL | INTRAVENOUS | Status: DC | PRN

## 2018-11-11 MED ORDER — HYDRALAZINE HCL 20 MG/ML IJ SOLN: 15.0000 mg | INTRAMUSCULAR | Status: DC | PRN

## 2018-11-11 MED ORDER — NITROGLYCERIN 0.4 MG SL SUBL
SUBLINGUAL_TABLET | SUBLINGUAL | Status: AC
  Administered 2018-11-11: 0.4 mg via SUBLINGUAL
  Filled 2018-11-11: qty 1

## 2018-11-11 MED ORDER — CLOPIDOGREL BISULFATE 75 MG PO TABS
75.0000 mg | ORAL_TABLET | Freq: Every day | ORAL | Status: DC
  Administered 2018-11-12: 75 mg via ORAL

## 2018-11-11 MED ORDER — NITROGLYCERIN IN D5W 200-5 MCG/ML-% IV SOLN
INTRAVENOUS | Status: AC | PRN
  Administered 2018-11-11: 20 ug/min via INTRAVENOUS

## 2018-11-11 MED ORDER — FENTANYL CITRATE (PF) 100 MCG/2ML IJ SOLN
INTRAMUSCULAR | Status: DC | PRN
  Administered 2018-11-11: 50 ug via INTRAVENOUS
  Administered 2018-11-11 (×3): 25 ug via INTRAVENOUS

## 2018-11-11 MED ORDER — HEPARIN (PORCINE) IN NACL 1000-0.9 UT/500ML-% IV SOLN
INTRAVENOUS | Status: DC | PRN
  Administered 2018-11-11 (×2): 500 mL

## 2018-11-11 MED ORDER — LOSARTAN POTASSIUM 50 MG PO TABS
100.0000 mg | ORAL_TABLET | Freq: Every day | ORAL | Status: DC
  Administered 2018-11-11 – 2018-11-12 (×2): 100 mg via ORAL
  Filled 2018-11-11 (×3): qty 2

## 2018-11-11 MED ORDER — CLOPIDOGREL BISULFATE 75 MG PO TABS
ORAL_TABLET | ORAL | Status: DC | PRN
  Administered 2018-11-11: 75 mg via ORAL
  Administered 2018-11-11: 225 mg via ORAL

## 2018-11-11 MED ORDER — SODIUM CHLORIDE 0.9 % WEIGHT BASED INFUSION: 1.0000 mL/kg/h | INTRAVENOUS | Status: DC

## 2018-11-11 MED ORDER — METOPROLOL SUCCINATE ER 50 MG PO TB24
50.0000 mg | ORAL_TABLET | Freq: Every day | ORAL | Status: DC
  Administered 2018-11-11 – 2018-11-12 (×2): 50 mg via ORAL
  Filled 2018-11-11 (×3): qty 1

## 2018-11-11 MED ORDER — HEPARIN (PORCINE) IN NACL 1000-0.9 UT/500ML-% IV SOLN
INTRAVENOUS | Status: AC
  Filled 2018-11-11: qty 1000

## 2018-11-11 MED ORDER — SODIUM CHLORIDE 0.9% FLUSH
3.0000 mL | Freq: Two times a day (BID) | INTRAVENOUS | Status: DC
  Administered 2018-11-11: 3 mL via INTRAVENOUS

## 2018-11-11 MED ORDER — HEPARIN SODIUM (PORCINE) 1000 UNIT/ML IJ SOLN
INTRAMUSCULAR | Status: AC
  Filled 2018-11-11: qty 1

## 2018-11-11 MED ORDER — CLOPIDOGREL BISULFATE 75 MG PO TABS
75.0000 mg | ORAL_TABLET | Freq: Every day | ORAL | Status: DC
  Filled 2018-11-11: qty 1

## 2018-11-11 MED ORDER — ACETAMINOPHEN 325 MG PO TABS: 650.0000 mg | ORAL_TABLET | ORAL | Status: DC | PRN

## 2018-11-11 MED ORDER — CLOPIDOGREL BISULFATE 75 MG PO TABS
ORAL_TABLET | ORAL | Status: AC
  Filled 2018-11-11: qty 3

## 2018-11-11 MED ORDER — POTASSIUM CHLORIDE CRYS ER 10 MEQ PO TBCR
10.0000 meq | EXTENDED_RELEASE_TABLET | Freq: Every day | ORAL | Status: DC
  Administered 2018-11-11 – 2018-11-12 (×2): 10 meq via ORAL
  Filled 2018-11-11 (×3): qty 1

## 2018-11-11 SURGICAL SUPPLY — 16 items
BALLN TREK RX 2.5X15 (BALLOONS) ×3
BALLN ~~LOC~~ TREK RX 3.0X12 (BALLOONS) ×3
BALLOON TREK RX 2.5X15 (BALLOONS) IMPLANT
BALLOON ~~LOC~~ TREK RX 3.0X12 (BALLOONS) IMPLANT
CATH INFINITI 5 FR JL3.5 (CATHETERS) ×2 IMPLANT
CATH INFINITI 5FR JL4 (CATHETERS) ×2 IMPLANT
CATH INFINITI JR4 5F (CATHETERS) ×2 IMPLANT
CATH VISTA GUIDE 6FR XB3.5 SH (CATHETERS) ×2 IMPLANT
DEVICE INFLAT 30 PLUS (MISCELLANEOUS) ×2 IMPLANT
DEVICE RAD TR BAND REGULAR (VASCULAR PRODUCTS) ×2 IMPLANT
GLIDESHEATH SLEND SS 6F .021 (SHEATH) ×2 IMPLANT
KIT MANI 3VAL PERCEP (MISCELLANEOUS) ×3 IMPLANT
PACK CARDIAC CATH (CUSTOM PROCEDURE TRAY) ×3 IMPLANT
STENT RESOLUTE ONYX 2.5X15 (Permanent Stent) ×2 IMPLANT
WIRE G HI TQ BMW 190 (WIRE) ×2 IMPLANT
WIRE ROSEN-J .035X260CM (WIRE) ×2 IMPLANT

## 2018-11-11 NOTE — Progress Notes (Signed)
[facility] [place] NAME: Kjuan Seipp    MR#:  [id number]  [place]:  09-29-1965  SUBJECTIVE:  Patient continues to complain of chest pain despite being on heparin drip, wife at the bedside, case discussed with Dr. Calderwood/cardiology, per nursing staff-for heart catheterization later today, check echocardiogram  REVIEW OF SYSTEMS:  CONSTITUTIONAL: No fever, fatigue or weakness.  EYES: No blurred or double vision.  EARS, NOSE, AND THROAT: No tinnitus or ear pain.  RESPIRATORY: No cough, shortness of breath, wheezing or hemoptysis.  CARDIOVASCULAR: No chest pain, orthopnea, edema.  GASTROINTESTINAL: No nausea, vomiting, diarrhea or abdominal pain.  GENITOURINARY: No dysuria, hematuria.  ENDOCRINE: No polyuria, nocturia,  HEMATOLOGY: No anemia, easy bruising or bleeding SKIN: No rash or lesion. MUSCULOSKELETAL: No joint pain or arthritis.   NEUROLOGIC: No tingling, numbness, weakness.  PSYCHIATRY: No anxiety or depression.   ROS  DRUG ALLERGIES:   Allergies  Allergen Reactions  . Lopid [Gemfibrozil]   . Statins Other (See Comments)    Pain and cramps    VITALS:  Blood pressure (!) 160/84, pulse 73, temperature 98.3 F (36.8 C), temperature source Oral, resp. rate 18, height 5\' 9"  (1.753 m), weight 77.1 kg, SpO2 97 %.  PHYSICAL EXAMINATION:  GENERAL:  53 y.o.-year-old patient lying in the bed with no acute distress.  EYES: Pupils equal, round, reactive to light and accommodation. No scleral icterus. Extraocular muscles intact.  HEENT: Head atraumatic, normocephalic. Oropharynx and nasopharynx clear.  NECK:  Supple, no jugular venous distention. No thyroid enlargement, no tenderness.  LUNGS: Normal breath sounds bilaterally, no wheezing, rales,rhonchi or crepitation. No use of accessory muscles of respiration.  CARDIOVASCULAR: S1, S2 normal. No murmurs, rubs, or gallops.  ABDOMEN: Soft, nontender, nondistended. Bowel sounds  present. No organomegaly or mass.  EXTREMITIES: No pedal edema, cyanosis, or clubbing.  NEUROLOGIC: Cranial nerves II through XII are intact. Muscle strength 5/5 in all extremities. Sensation intact. Gait not checked.  PSYCHIATRIC: The patient is alert and oriented x 3.  SKIN: No obvious rash, lesion, or ulcer.   Physical Exam LABORATORY PANEL:   CBC Recent Labs  Lab 11/11/18 0413  WBC 8.0  HGB 16.2  HCT 45.0  PLT 153   ------------------------------------------------------------------------------------------------------------------  Chemistries  Recent Labs  Lab 11/10/18 2028 11/11/18 0413  NA 141 141  K 2.9* 3.5  CL 100 110  CO2 29 24  GLUCOSE 148* 99  BUN 13 12  CREATININE 0.98 0.63  CALCIUM 9.2 8.8*  AST 32  --   ALT 52*  --   ALKPHOS 70  --   BILITOT 1.5*  --    ------------------------------------------------------------------------------------------------------------------  Cardiac Enzymes Recent Labs  Lab 11/11/18 0413 11/11/18 1015  TROPONINI 0.11* 0.11*   ------------------------------------------------------------------------------------------------------------------  RADIOLOGY:  Dg Chest Portable 1 View  Result Date: 11/10/2018 CLINICAL DATA:  Left-sided chest pain. EXAM: PORTABLE CHEST 1 VIEW COMPARISON:  01/22/2015 FINDINGS: The cardiac silhouette is upper limits of normal in size. The lungs are mildly hypoinflated without evidence of airspace consolidation, edema, pleural effusion, or pneumothorax. No acute osseous abnormality is seen. IMPRESSION: No active disease. Electronically Signed   By: Logan Bores M.D.   On: 11/10/2018 20:52    ASSESSMENT AND PLAN:  *Acute angina with CAD and elevated troponins Noted increasing troponins, continued chest pain Continue chest pain protocol, heparin drip, DAPT with aspirin/Plavix, losartan, Toprol-XL, nitrates as needed, IV morphine PRN breakthrough pain, supplemental oxygen, discussed with Dr.  Callwood/cardiology as  patient may require heart catheterization later today, follow-up on echocardiogram  *Acute accelerated hypertension Improved Continue losartan, hydrochlorothiazide, Toprol-XL, IV hydralazine as needed systolic blood pressure greater than 150, vitals per routine, make changes as per necessary  *Chronic hyperlipidemia, unspecified Noted allergy to statin therapy Zetia, fish oil  *Chronic GAD Stable Klonopin as needed  Disposition pending clinical course, clearance per cardiology, 1-2 days to home  All the records are reviewed and case discussed with Care Management/Social Workerr. Management plans discussed with the patient, family and they are in agreement.  CODE STATUS: full  TOTAL TIME TAKING CARE OF THIS PATIENT: 35 minutes.     POSSIBLE D/C IN 1-2 DAYS, DEPENDING ON CLINICAL CONDITION.   Avel Peace Hoby Kawai M.D on 11/11/2018   Between 7am to 6pm - Pager - [phone]  After 6pm go to [email or web address] - password [email or web address] [facility]  Office  [phone]  CC: Primary care physician; Cletis Athens, MD  Note: This dictation was prepared with Dragon dictation along with smaller phrase technology. Any transcriptional errors that result from this process are unintentional.

## 2018-11-11 NOTE — OR Nursing (Signed)
On arrival to special patient reported chest presuure radiating to left shoulder. 12 EKG obtained one sublingual NTG given with total relief of pain within 5 minutes. Initial BP 211/133 then reduced to 165/93 with pain relief

## 2018-11-11 NOTE — Plan of Care (Signed)
  Problem: Clinical Measurements: Goal: Ability to maintain clinical measurements within normal limits will improve Outcome: Progressing   Problem: Activity: Goal: Risk for activity intolerance will decrease Outcome: Progressing   Problem: Pain Managment: Goal: General experience of comfort will improve Outcome: Progressing   Problem: Cardiac: Goal: Ability to achieve and maintain adequate cardiovascular perfusion will improve Outcome: Progressing   

## 2018-11-11 NOTE — Progress Notes (Signed)
ANTICOAGULATION CONSULT NOTE - Initial Consult  Pharmacy Consult for Heparin  Indication: chest pain/ACS  Allergies  Allergen Reactions  . Lopid [Gemfibrozil]   . Statins Other (See Comments)    Pain and cramps    Patient Measurements: Height: 5\' 9"  (175.3 cm) Weight: 170 lb (77.1 kg) IBW/kg (Calculated) : 70.7 Heparin Dosing Weight:  77.1 kg   Vital Signs: Temp: 97.4 F (36.3 C) (02/20 2226) Temp Source: Oral (02/20 2226) BP: 142/86 (02/20 2245) Pulse Rate: 85 (02/20 2245)  Labs: Recent Labs    11/10/18 2028 11/10/18 2239 11/11/18 0413  HGB 16.9  --  16.2  HCT 47.9  --  45.0  PLT 158  --  153  APTT 32  --   --   LABPROT 12.8  --   --   INR 0.97  --   --   HEPARINUNFRC  --   --  0.14*  CREATININE 0.98  --  0.63  TROPONINI <0.03 0.06* 0.11*    Estimated Creatinine Clearance: 105.6 mL/min (by C-G formula based on SCr of 0.63 mg/dL).   Medical History: Past Medical History:  Diagnosis Date  . Anxiety   . CAD (coronary artery disease)   . HLD (hyperlipidemia)   . Hypertension   . Thrombocytopenia ([place])     Medications:  Medications Prior to Admission  Medication Sig Dispense Refill Last Dose  . aspirin 81 MG tablet Take 81 mg by mouth daily.    11/09/2018 at U2200nknown time  . clopidogrel (PLAVIX) 75 MG tablet Take 75 mg by mouth daily.   11/10/2018 at 0700  . fluticasone (FLONASE) 50 MCG/ACT nasal spray Place 1 spray into both nostrils daily as needed for allergies or rhinitis.   prn at prn  . loratadine (CLARITIN) 10 MG tablet Take 10 mg by mouth every other day.   11/09/2018 at 0700  . losartan-hydrochlorothiazide (HYZAAR) 100-25 MG tablet Take 1 tablet by mouth daily.   11/10/2018 at 0700  . Magnesium Oxide 400 (240 MG) MG TABS One tab once per day (Patient taking differently: Take 400 mg by mouth daily. One tab once per day) 30 tablet 0 11/10/2018 at 0700  . metoprolol succinate (TOPROL-XL) 50 MG 24 hr tablet Take 50 mg by mouth daily. Take with or  immediately following a meal.   11/10/2018 at 0700  . potassium chloride (K-DUR,KLOR-CON) 10 MEQ tablet Take 10 mEq by mouth daily.   11/10/2018 at 0700  . zolpidem (AMBIEN) 5 MG tablet Take 5 mg by mouth at bedtime as needed.    11/09/2018 at 2200  . SUMAtriptan (IMITREX) 50 MG tablet Take 50 mg by mouth every 2 (two) hours as needed for migraine. May repeat in 2 hours if headache persists or recurs.   Not Taking at Unknown time    Assessment: Pharmacy consulted to dose heparin in this 53 year old male admitted with ACS/NSTEMI.  No prior anticoag noted. CrCl = 86.2 ml/min   Goal of Therapy:  Heparin level 0.3-0.7 units/ml Monitor platelets by anticoagulation protocol: Yes   Plan:  Will draw baseline APTT and INR.  Will order Heparin 4000 units IV X 1 bolus and start heparin drip @ 900 units/hr.  Will draw 1st HL 6 hrs after start of drip.  Will check CBC/HL daily.   2/21 0400 heparin level 0.14. 2300 unit bolus and increase rate to 1200 units/hr. Recheck in 6 hours.  Sim Boast, PharmD, BCPS  11/11/18 5:22 AM

## 2018-11-11 NOTE — Progress Notes (Signed)
ANTICOAGULATION CONSULT NOTE - Initial Consult  Pharmacy Consult for Heparin  Indication: chest pain/ACS  Allergies  Allergen Reactions  . Lopid [Gemfibrozil]   . Statins Other (See Comments)    Pain and cramps    Patient Measurements: Height: 5\' 9"  (175.3 cm) Weight: 170 lb (77.1 kg) IBW/kg (Calculated) : 70.7 Heparin Dosing Weight:  77.1 kg   Vital Signs: Temp: 98.5 F (36.9 C) (02/21 1336) Temp Source: Oral (02/21 1336) BP: 154/82 (02/21 1900) Pulse Rate: 71 (02/21 1900)  Labs: Recent Labs    11/10/18 2028 11/10/18 2239 11/11/18 0413 11/11/18 1015 11/11/18 1151  HGB 16.9  --  16.2  --   --   HCT 47.9  --  45.0  --   --   PLT 158  --  153  --   --   APTT 32  --   --   --   --   LABPROT 12.8  --   --   --   --   INR 0.97  --   --   --   --   HEPARINUNFRC  --   --  0.14*  --  0.48  CREATININE 0.98  --  0.63  --   --   TROPONINI <0.03 0.06* 0.11* 0.11*  --     Estimated Creatinine Clearance: 105.6 mL/min (by C-G formula based on SCr of 0.63 mg/dL).   Medical History: Past Medical History:  Diagnosis Date  . Anxiety   . CAD (coronary artery disease)   . HLD (hyperlipidemia)   . Hypertension   . Thrombocytopenia ([place])     Medications:  Medications Prior to Admission  Medication Sig Dispense Refill Last Dose  . aspirin 81 MG tablet Take 81 mg by mouth daily.    11/09/2018 at U2200nknown time  . clopidogrel (PLAVIX) 75 MG tablet Take 75 mg by mouth daily.   11/10/2018 at 0700  . fluticasone (FLONASE) 50 MCG/ACT nasal spray Place 1 spray into both nostrils daily as needed for allergies or rhinitis.   prn at prn  . loratadine (CLARITIN) 10 MG tablet Take 10 mg by mouth every other day.   11/09/2018 at 0700  . losartan-hydrochlorothiazide (HYZAAR) 100-25 MG tablet Take 1 tablet by mouth daily.   11/10/2018 at 0700  . Magnesium Oxide 400 (240 MG) MG TABS One tab once per day (Patient taking differently: Take 400 mg by mouth daily. One tab once per day) 30 tablet 0  11/10/2018 at 0700  . metoprolol succinate (TOPROL-XL) 50 MG 24 hr tablet Take 50 mg by mouth daily. Take with or immediately following a meal.   11/10/2018 at 0700  . potassium chloride (K-DUR,KLOR-CON) 10 MEQ tablet Take 10 mEq by mouth daily.   11/10/2018 at 0700  . zolpidem (AMBIEN) 5 MG tablet Take 5 mg by mouth at bedtime as needed.    11/09/2018 at 2200  . SUMAtriptan (IMITREX) 50 MG tablet Take 50 mg by mouth every 2 (two) hours as needed for migraine. May repeat in 2 hours if headache persists or recurs.   Not Taking at Unknown time    Assessment: Pharmacy consulted to dose heparin in this 53 year old male admitted with ACS/NSTEMI.  No prior anticoag noted. CrCl = 86.2 ml/min   Goal of Therapy:  Heparin level 0.3-0.7 units/ml Monitor platelets by anticoagulation protocol: Yes   Plan:  Will draw baseline APTT and INR.  Will order Heparin 4000 units IV X 1 bolus and start heparin drip @  900 units/hr.  Will draw 1st HL 6 hrs after start of drip.  Will check CBC/HL daily.   2/21 0400 heparin level 0.14. 2300 unit bolus and increase rate to 1200 units/hr. Recheck in 6 hours.  2/22 1200 heparin level 0.48. Continue current regimen. Will recheck with tomorrow AM labs as does not appear to have had confirmation level drawn today.  Sim Boast, PharmD, BCPS  11/11/18 10:37 PM

## 2018-11-11 NOTE — Progress Notes (Signed)
Pt off the floor for cardiac cath.

## 2018-11-11 NOTE — Progress Notes (Signed)
*  PRELIMINARY RESULTS* Echocardiogram 2D Echocardiogram has been performed.  Sherrie Sport 11/11/2018, 11:16 AM

## 2018-11-11 NOTE — OR Nursing (Signed)
Nurse Danae Chen and patient said Held am BP meds because pt refused to take them when NPO, it causes stomach pains when taken on empty stomach.

## 2018-11-11 NOTE — Plan of Care (Signed)
  Problem: Education: Goal: Knowledge of General Education information will improve Description Including pain rating scale, medication(s)/side effects and non-pharmacologic comfort measures Outcome: Progressing   Problem: Clinical Measurements: Goal: Ability to maintain clinical measurements within normal limits will improve Outcome: Progressing Goal: Will remain free from infection Outcome: Progressing   Problem: Activity: Goal: Risk for activity intolerance will decrease Outcome: Progressing   Problem: Pain Managment: Goal: General experience of comfort will improve Outcome: Progressing   Problem: Cardiac: Goal: Ability to achieve and maintain adequate cardiovascular perfusion will improve Outcome: Progressing

## 2018-11-11 NOTE — Progress Notes (Signed)
Follow up troponin 0.06. Patient had one episode of 10/10 midsternal "numbing" chest pain that was relieved by 2 SL NTG. Heparin gtt infusing at this time. MD Jannifer Franklin notified. No new orders at this time.   Update: Troponin 0.11. No complaints of chest pain at this. Heparin gtt infusing. Will continue to monitor.

## 2018-11-11 NOTE — Plan of Care (Signed)
  Problem: Clinical Measurements: Goal: Ability to maintain clinical measurements within normal limits will improve 11/11/2018 0625 by Loran Senters, RN Outcome: Progressing 11/11/2018 0450 by Loran Senters, RN Outcome: Progressing   Problem: Activity: Goal: Risk for activity intolerance will decrease Outcome: Progressing   Problem: Pain Managment: Goal: General experience of comfort will improve 11/11/2018 0625 by Loran Senters, RN Outcome: Progressing 11/11/2018 0450 by Loran Senters, RN Outcome: Progressing   Problem: Activity: Goal: Ability to tolerate increased activity will improve Outcome: Progressing   Problem: Cardiac: Goal: Ability to achieve and maintain adequate cardiovascular perfusion will improve Outcome: Progressing

## 2018-11-11 NOTE — Consult Note (Signed)
Reason for Consult: Unstable angina known coronary disease Referring Physician: Dr. Jerelyn Charles hospitalist Cardiologist [name] is an 53 y.o. male.  HPI: Patient known history of coronary disease history of PCI and stent about 2 years ago to RCA by Dr. parachute with DES.  Patient states his been having unstable anginal symptoms with recurrent chest pain over the last several weeks with radiation of pain into the neck and arm.  Patient had some shortness of breath generalized weakness and fatigue.  He is been unable to tolerate statin therapy but states of been otherwise compliant with medication.  Because of persistent anginal symptoms over the last 24 hours patient was referred to emergency room and subsequently admitted for further cardiac assessment evaluation  Past Medical History:  Diagnosis Date  . Anxiety   . CAD (coronary artery disease)   . HLD (hyperlipidemia)   . Hypertension   . Thrombocytopenia ([place])     Past Surgical History:  Procedure Laterality Date  . COLONOSCOPY WITH PROPOFOL N/A 12/12/2015   Procedure: COLONOSCOPY WITH PROPOFOL;  Surgeon: Manya Silvas, MD;  Location: [facility] ENDOSCOPY;  Service: Endoscopy;  Laterality: N/A;  . CORONARY STENT INTERVENTION N/A 02/24/2017   Procedure: Coronary Stent Intervention;  Surgeon: Isaias Cowman, MD;  Location: [facility];  Service: Cardiovascular;  Laterality: N/A;  . LEFT HEART CATH AND CORONARY ANGIOGRAPHY Left 02/24/2017   Procedure: Left Heart Cath and Coronary Angiography;  Surgeon: Isaias Cowman, MD;  Location: [facility];  Service: Cardiovascular;  Laterality: Left;  . stents      Family History  Problem Relation Age of Onset  . CAD Mother   . Hypertension Mother   . Cancer Father   . CAD Father     Social History:  reports that he has been smoking cigarettes. He has a 6.25 pack-year smoking history. He has never used smokeless tobacco. He reports current alcohol use  of about 8.0 standard drinks of alcohol per week. He reports that he does not use drugs.  Allergies:  Allergies  Allergen Reactions  . Lopid [Gemfibrozil]   . Statins Other (See Comments)    Pain and cramps    Medications: I have reviewed the patient's current medications.  Results for orders placed or performed during the hospital encounter of 11/10/18 (from the past 48 hour(s))  Troponin I - ONCE - STAT     Status: None   Collection Time: 11/10/18  8:28 PM  Result Value Ref Range   Troponin I <0.03 <0.03 ng/mL    Comment: Performed at [facility], [address] [place], [place] [address]  CBC with Differential/Platelet     Status: None   Collection Time: 11/10/18  8:28 PM  Result Value Ref Range   WBC 8.6 4.0 - 10.5 K/uL   RBC 5.22 4.22 - 5.81 MIL/uL   Hemoglobin 16.9 13.0 - 17.0 g/dL   HCT 47.9 39.0 - 52.0 %   MCV 91.8 80.0 - 100.0 fL   MCH 32.4 26.0 - 34.0 pg   MCHC 35.3 30.0 - 36.0 g/dL   RDW 13.0 11.5 - 15.5 %   Platelets 158 150 - 400 K/uL   nRBC 0.0 0.0 - 0.2 %   Neutrophils Relative % 45 %   Neutro Abs 3.9 1.7 - 7.7 K/uL   Lymphocytes Relative 42 %   Lymphs Abs 3.7 0.7 - 4.0 K/uL   Monocytes Relative 10 %   Monocytes Absolute 0.9 0.1 -  1.0 K/uL   Eosinophils Relative 2 %   Eosinophils Absolute 0.1 0.0 - 0.5 K/uL   Basophils Relative 1 %   Basophils Absolute 0.0 0.0 - 0.1 K/uL   Immature Granulocytes 0 %   Abs Immature Granulocytes 0.02 0.00 - 0.07 K/uL    Comment: Performed at [facility], [address] [place], [place] [address]  Comprehensive metabolic panel     Status: Abnormal   Collection Time: 11/10/18  8:28 PM  Result Value Ref Range   Sodium 141 135 - 145 mmol/L   Potassium 2.9 (L) 3.5 - 5.1 mmol/L   Chloride 100 98 - 111 mmol/L   CO2 29 22 - 32 mmol/L   Glucose, Bld 148 (H) 70 - 99 mg/dL   BUN 13 6 - 20 mg/dL   Creatinine, Ser 0.98 0.61 - 1.24 mg/dL   Calcium 9.2 8.9 - 10.3 mg/dL   Total Protein 7.4 6.5 - 8.1 g/dL    Albumin 4.4 3.5 - 5.0 g/dL   AST 32 15 - 41 U/L   ALT 52 (H) 0 - 44 U/L   Alkaline Phosphatase 70 38 - 126 U/L   Total Bilirubin 1.5 (H) 0.3 - 1.2 mg/dL   GFR calc non Af Amer >60 >60 mL/min   GFR calc Af Amer >60 >60 mL/min   Anion gap 12 5 - 15    Comment: Performed at [facility], [address], [place], [place] [address]  Protime-INR     Status: None   Collection Time: 11/10/18  8:28 PM  Result Value Ref Range   Prothrombin Time 12.8 11.4 - 15.2 seconds   INR 0.97     Comment: Performed at [facility], [address] [place], [place] [address]  APTT     Status: None   Collection Time: 11/10/18  8:28 PM  Result Value Ref Range   aPTT 32 24 - 36 seconds    Comment: Performed at [facility], [address] [place], [place] [address]  Troponin I - Now Then Q6H     Status: Abnormal   Collection Time: 11/10/18 10:39 PM  Result Value Ref Range   Troponin I 0.06 (HH) <0.03 ng/mL    Comment: CRITICAL RESULT CALLED TO, READ BACK BY AND VERIFIED WITH ALISHA SCOTT @2314  11/10/18 TTG Performed at [facility], [address] [place], [place] [address]   CBC     Status: None   Collection Time: 11/11/18  4:13 AM  Result Value Ref Range   WBC 8.0 4.0 - 10.5 K/uL   RBC 4.94 4.22 - 5.81 MIL/uL   Hemoglobin 16.2 13.0 - 17.0 g/dL   HCT 45.0 39.0 - 52.0 %   MCV 91.1 80.0 - 100.0 fL   MCH 32.8 26.0 - 34.0 pg   MCHC 36.0 30.0 - 36.0 g/dL   RDW 13.0 11.5 - 15.5 %   Platelets 153 150 - 400 K/uL   nRBC 0.0 0.0 - 0.2 %    Comment: Performed at [facility], [address] [place], [place] [address]  Heparin level (unfractionated)     Status: Abnormal   Collection Time: 11/11/18  4:13 AM  Result Value Ref Range   Heparin Unfractionated 0.14 (L) 0.30 - 0.70 IU/mL    Comment: (NOTE) If heparin results are below expected values, and patient dosage has  been confirmed, suggest follow up testing of antithrombin III levels. Performed at  [facility], [address],  [place], [place] [address]   Troponin I - Now Then Q6H     Status: Abnormal   Collection Time: 11/11/18  4:13 AM  Result Value Ref Range   Troponin I 0.11 (HH) <0.03 ng/mL    Comment: CRITICAL RESULT CALLED TO, READ BACK BY AND VERIFIED WITH TRKNAB NESBITT @0447  11/11/2018 TTG Performed at [facility], [address] [place], [place] [address] CORRECTED ON 02/21 AT 1016: PREVIOUSLY REPORTED AS 0.11 CRITICAL RESULT CALLED TO, READ BACK BY AND VERIFIED WITH TRKNAB NESBITT @447  11/11/2018 TTG   Basic metabolic panel     Status: Abnormal   Collection Time: 11/11/18  4:13 AM  Result Value Ref Range   Sodium 141 135 - 145 mmol/L   Potassium 3.5 3.5 - 5.1 mmol/L   Chloride 110 98 - 111 mmol/L   CO2 24 22 - 32 mmol/L   Glucose, Bld 99 70 - 99 mg/dL   BUN 12 6 - 20 mg/dL   Creatinine, Ser 0.63 0.61 - 1.24 mg/dL   Calcium 8.8 (L) 8.9 - 10.3 mg/dL   GFR calc non Af Amer >60 >60 mL/min   GFR calc Af Amer >60 >60 mL/min   Anion gap 7 5 - 15    Comment: Performed at [facility], [address] [place], [place] [address]  Troponin I - Now Then Q6H     Status: Abnormal   Collection Time: 11/11/18 10:15 AM  Result Value Ref Range   Troponin I 0.11 (HH) <0.03 ng/mL    Comment: CRITICAL VALUE NOTED. VALUE IS CONSISTENT WITH PREVIOUSLY REPORTED/CALLED VALUE.PMF Performed at [facility], [place], [place] [address]   Heparin level (unfractionated)     Status: None   Collection Time: 11/11/18 11:51 AM  Result Value Ref Range   Heparin Unfractionated 0.48 0.30 - 0.70 IU/mL    Comment: (NOTE) If heparin results are below expected values, and patient dosage has  been confirmed, suggest follow up testing of antithrombin III levels. Performed at [facility], [address] [place],  [address]   POCT Activated clotting time     Status: None   Collection Time: 11/11/18  3:34 PM  Result Value  Ref Range   Activated Clotting Time 290 seconds    Dg Chest Portable 1 View  Result Date: 11/10/2018 CLINICAL DATA:  Left-sided chest pain. EXAM: PORTABLE CHEST 1 VIEW COMPARISON:  01/22/2015 FINDINGS: The cardiac silhouette is upper limits of normal in size. The lungs are mildly hypoinflated without evidence of airspace consolidation, edema, pleural effusion, or pneumothorax. No acute osseous abnormality is seen. IMPRESSION: No active disease. Electronically Signed   By: Logan Bores M.D.   On: 11/10/2018 20:52    Review of Systems  Constitutional: Positive for diaphoresis and malaise/fatigue.  HENT: Positive for congestion.   Eyes: Negative.   Respiratory: Positive for shortness of breath.   Cardiovascular: Positive for chest pain.  Gastrointestinal: Negative.   Genitourinary: Negative.   Musculoskeletal: Negative.   Skin: Negative.   Neurological: Positive for tingling and weakness.  Endo/Heme/Allergies: Negative.   Psychiatric/Behavioral: Negative.    Blood pressure (!) 154/82, pulse 71, temperature 98.5 F (36.9 C), temperature source Oral, resp. rate 13, height 5\' 9"  (1.753 m), weight 77.1 kg, SpO2 97 %. Physical Exam  Nursing note and vitals reviewed. Constitutional: He is oriented to person, place, and time. He appears well-developed and well-nourished.  HENT:  Head: Normocephalic and atraumatic.  Eyes: Pupils are equal, round, and  reactive to light. Conjunctivae and EOM are normal.  Neck: Normal range of motion. Neck supple.  Cardiovascular: Normal rate and regular rhythm.  Murmur heard. Respiratory: Effort normal and breath sounds normal.  GI: Soft. Bowel sounds are normal.  Musculoskeletal: Normal range of motion.  Neurological: He is alert and oriented to person, place, and time. He has normal reflexes.  Skin: Skin is warm and dry.  Psychiatric: He has a normal mood and affect.    Assessment/Plan: Unstable angina Coronary artery disease PCI and stent by  history Hyperlipidemia Previous history of smoking Hypertension Anxiety . Plan Agree with admission to telemetry Follow-up myocardial infarction EKGs enzymes Echocardiogram was helpful for shortness of breath Conservative treatment statin therapy or Repatha Continue device refrain from tobacco abuse Aspirin Plavi beta-blocker ACE inhibitor Continue anxiolytics for anxiety Recommend diet and exercise Proceed with cardiac cath for unstable anginal symptoms    [name] 11/11/2018, 11:40 PM

## 2018-11-12 LAB — HIV ANTIBODY (ROUTINE TESTING W REFLEX): HIV SCREEN 4TH GENERATION: NONREACTIVE

## 2018-11-12 MED ORDER — PRAVASTATIN SODIUM 20 MG PO TABS: 10.0000 mg | ORAL_TABLET | Freq: Every day | ORAL | Status: DC

## 2018-11-12 MED ORDER — PRAVASTATIN SODIUM 10 MG PO TABS: 10.0000 mg | ORAL_TABLET | Freq: Every day | ORAL | 2 refills | Status: DC

## 2018-11-12 MED ORDER — HYDROCHLOROTHIAZIDE 25 MG PO TABS
25.0000 mg | ORAL_TABLET | Freq: Every day | ORAL | Status: DC
  Administered 2018-11-12: 25 mg via ORAL

## 2018-11-12 MED ORDER — LOSARTAN POTASSIUM 50 MG PO TABS: 100.0000 mg | ORAL_TABLET | Freq: Every day | ORAL | Status: DC

## 2018-11-12 NOTE — Discharge Summary (Signed)
[employer] - [place] at [facility], 53 y.o., DOB Apr 23, 1966, MRN [id number]. Admission date: 11/10/2018 Discharge Date 11/12/2018 Primary MD Cletis [place], MD Admitting Physician Lance Coon, MD  Admission Diagnosis  Abnormal EKG [R94.31] Chest pain, unspecified type [R07.9] Chest pain [R07.9]  Discharge Diagnosis   Principal Problem: Unstable angina CAD (coronary artery disease)  HTN (hypertension) HLD (hyperlipidemia) Anxiety         Hospital Course Patient known history of coronary disease history of PCI and stent about 2 years ago to RCA by Dr. parachute with DES.  Patient states his been having unstable anginal symptoms with recurrent chest pain over the last several weeks with radiation of pain into the neck and arm.  Patient symptoms were concerning.  Therefore he was admitted.  Initially under observation however requiring stent.  Therefore he was changed to inpatient.  He is doing much better his chest pain is now resolved.    *Please note only low-dose statin is prescribed due to the fact patient has not tolerated statins in the past his statin therapy will be escalated as outpatient.        Consults  cardiology  Significant Tests:  See full reports for all details     Dg Chest Portable 1 View  Result Date: 11/10/2018 CLINICAL DATA:  Left-sided chest pain. EXAM: PORTABLE CHEST 1 VIEW COMPARISON:  01/22/2015 FINDINGS: The cardiac silhouette is upper limits of normal in size. The lungs are mildly hypoinflated without evidence of airspace consolidation, edema, pleural effusion, or pneumothorax. No acute osseous abnormality is seen. IMPRESSION: No active disease. Electronically Signed   By: Logan Bores M.D.   On: 11/10/2018 20:52       Today   Subjective:   Viviana Simpler patient feeling better denies any chest pains  Objective:   Blood pressure (!) 161/91, pulse 63, temperature 98.6 F (37 C), temperature source Oral, resp.  rate 19, height 5\' 9"  (1.753 m), weight 77.1 kg, SpO2 99 %.  .  Intake/Output Summary (Last 24 hours) at 11/12/2018 1355 Last data filed at 11/12/2018 0405 Gross per 24 hour  Intake -  Output 450 ml  Net -450 ml    Exam VITAL SIGNS: Blood pressure (!) 161/91, pulse 63, temperature 98.6 F (37 C), temperature source Oral, resp. rate 19, height 5\' 9"  (1.753 m), weight 77.1 kg, SpO2 99 %.  GENERAL:  53 y.o.-year-old patient lying in the bed with no acute distress.  EYES: Pupils equal, round, reactive to light and accommodation. No scleral icterus. Extraocular muscles intact.  HEENT: Head atraumatic, normocephalic. Oropharynx and nasopharynx clear.  NECK:  Supple, no jugular venous distention. No thyroid enlargement, no tenderness.  LUNGS: Normal breath sounds bilaterally, no wheezing, rales,rhonchi or crepitation. No use of accessory muscles of respiration.  CARDIOVASCULAR: S1, S2 normal. No murmurs, rubs, or gallops.  ABDOMEN: Soft, nontender, nondistended. Bowel sounds present. No organomegaly or mass.  EXTREMITIES: No pedal edema, cyanosis, or clubbing.  NEUROLOGIC: Cranial nerves II through XII are intact. Muscle strength 5/5 in all extremities. Sensation intact. Gait not checked.  PSYCHIATRIC: The patient is alert and oriented x 3.  SKIN: No obvious rash, lesion, or ulcer.   Data Review     CBC w Diff:  Lab Results  Component Value Date   WBC 8.0 11/11/2018   HGB 16.2 11/11/2018   HCT 45.0 11/11/2018   PLT 153 11/11/2018   LYMPHOPCT 42 11/10/2018   MONOPCT 10 11/10/2018   EOSPCT 2  11/10/2018   BASOPCT 1 11/10/2018   CMP:  Lab Results  Component Value Date   NA 141 11/11/2018   K 3.5 11/11/2018   CL 110 11/11/2018   CO2 24 11/11/2018   BUN 12 11/11/2018   CREATININE 0.63 11/11/2018   PROT 7.4 11/10/2018   ALBUMIN 4.4 11/10/2018   BILITOT 1.5 (H) 11/10/2018   ALKPHOS 70 11/10/2018   AST 32 11/10/2018   ALT 52 (H) 11/10/2018  .  Micro Results No results found  for this or any previous visit (from the past 240 hour(s)).      Code Status Orders  (From admission, onward)         Start     Ordered   11/10/18 2221  Full code  Continuous     11/10/18 2220        Code Status History    Date Active Date Inactive Code Status Order ID Comments User Context   02/24/2017 1000 02/25/2017 1410 Full Code [id number]  Isaias Cowman, MD Inpatient          Follow-up Information    Cletis Athens, MD Follow up in 6 day(s).   Specialty:  Internal Medicine Contact information: [place] [address] [phone]        Isaias Cowman, MD Follow up in 2 week(s).   Specialty:  Cardiology Why:  hosp f/u Contact information: [facility] [place] [address] [phone]           Discharge Medications   Allergies as of 11/12/2018      Reactions   Lopid [gemfibrozil]    Statins Other (See Comments)   Pain and cramps      Medication List    TAKE these medications   aspirin 81 MG tablet Take 81 mg by mouth daily.   clopidogrel 75 MG tablet Commonly known as:  PLAVIX Take 75 mg by mouth daily.   fluticasone 50 MCG/ACT nasal spray Commonly known as:  FLONASE Place 1 spray into both nostrils daily as needed for allergies or rhinitis.   loratadine 10 MG tablet Commonly known as:  CLARITIN Take 10 mg by mouth every other day.   losartan-hydrochlorothiazide 100-25 MG tablet Commonly known as:  HYZAAR Take 1 tablet by mouth daily.   Magnesium Oxide 400 (240 Mg) MG Tabs One tab once per day What changed:    how much to take  how to take this  when to take this   metoprolol succinate 50 MG 24 hr tablet Commonly known as:  TOPROL-XL Take 50 mg by mouth daily. Take with or immediately following a meal.   potassium chloride 10 MEQ tablet Commonly known as:  K-DUR,KLOR-CON Take 10 mEq by mouth daily.   pravastatin 10 MG tablet Commonly known as:   PRAVACHOL Take 1 tablet (10 mg total) by mouth daily at 6 PM.   SUMAtriptan 50 MG tablet Commonly known as:  IMITREX Take 50 mg by mouth every 2 (two) hours as needed for migraine. May repeat in 2 hours if headache persists or recurs.   zolpidem 5 MG tablet Commonly known as:  AMBIEN Take 5 mg by mouth at bedtime as needed.          Total Time in preparing paper work, data evaluation and todays exam - 32 minutes  Dustin Flock M.D on 11/12/2018 at [place]  [phone]

## 2018-11-12 NOTE — Progress Notes (Signed)
Discharge instructions explained to pt and pts spouse/ verbalized an understanding/ iv and tele removed/ transported off unit via wheelchair.  

## 2018-11-12 NOTE — Progress Notes (Signed)
[facility] at [place] was admitted to the Hospital on 11/10/2018 and Discharged  11/12/2018 and should be excused from work/school   for 9 days starting 11/10/2018 , may return to work/school without any restrictions.  Call Dustin Flock MD with questions.  Dustin Flock M.D on 11/12/2018,at 11:15 AM  [place] at [facility]  [phone]

## 2018-11-12 NOTE — Plan of Care (Signed)
  Problem: Clinical Measurements: Goal: Ability to maintain clinical measurements within normal limits will improve Outcome: Progressing   Problem: Pain Managment: Goal: General experience of comfort will improve Outcome: Progressing   Problem: Safety: Goal: Ability to remain free from injury will improve Outcome: Progressing   Problem: Activity: Goal: Ability to tolerate increased activity will improve Outcome: Progressing

## 2018-11-12 NOTE — Progress Notes (Signed)
Patient seen by me today status post PCI and stent to circumflex with DES yesterday.  Patient states he is doing reasonably well no chest pain symptoms right radial area appears to be healing well.  Patient ready for discharge.  He states that he is been unable to tolerate statins in the past because of muscle aches.  We discussed the need to try low-dose statins again and have recommended very low-dose Pravachol to see if he is able to tolerate that for the next 90 days or so.  If he is unable to tolerate Pravachol then will consider Repatha.  Will have patient follow-up with Dr. Saralyn Pilar as an outpatient 1 to 2 weeks

## 2018-11-12 NOTE — Plan of Care (Signed)
  Problem: Clinical Measurements: Goal: Ability to maintain clinical measurements within normal limits will improve Outcome: Progressing   Problem: Pain Managment: Goal: General experience of comfort will improve Outcome: Progressing   Problem: Safety: Goal: Ability to remain free from injury will improve Outcome: Progressing   Problem: Activity: Goal: Ability to tolerate increased activity will improve Outcome: Progressing   Problem: Cardiovascular: Goal: Vascular access site(s) Level 0-1 will be maintained Outcome: Progressing

## 2018-11-15 ENCOUNTER — Encounter: Payer: Self-pay | Admitting: Internal Medicine

## 2019-10-08 IMAGING — DX DG CHEST 1V PORT
1 series · 1 of 1 positions shown · non-contrast
Comparison: 01/22/2015

CLINICAL DATA: Left-sided chest pain.

EXAM:
PORTABLE CHEST 1 VIEW

[chest ap]
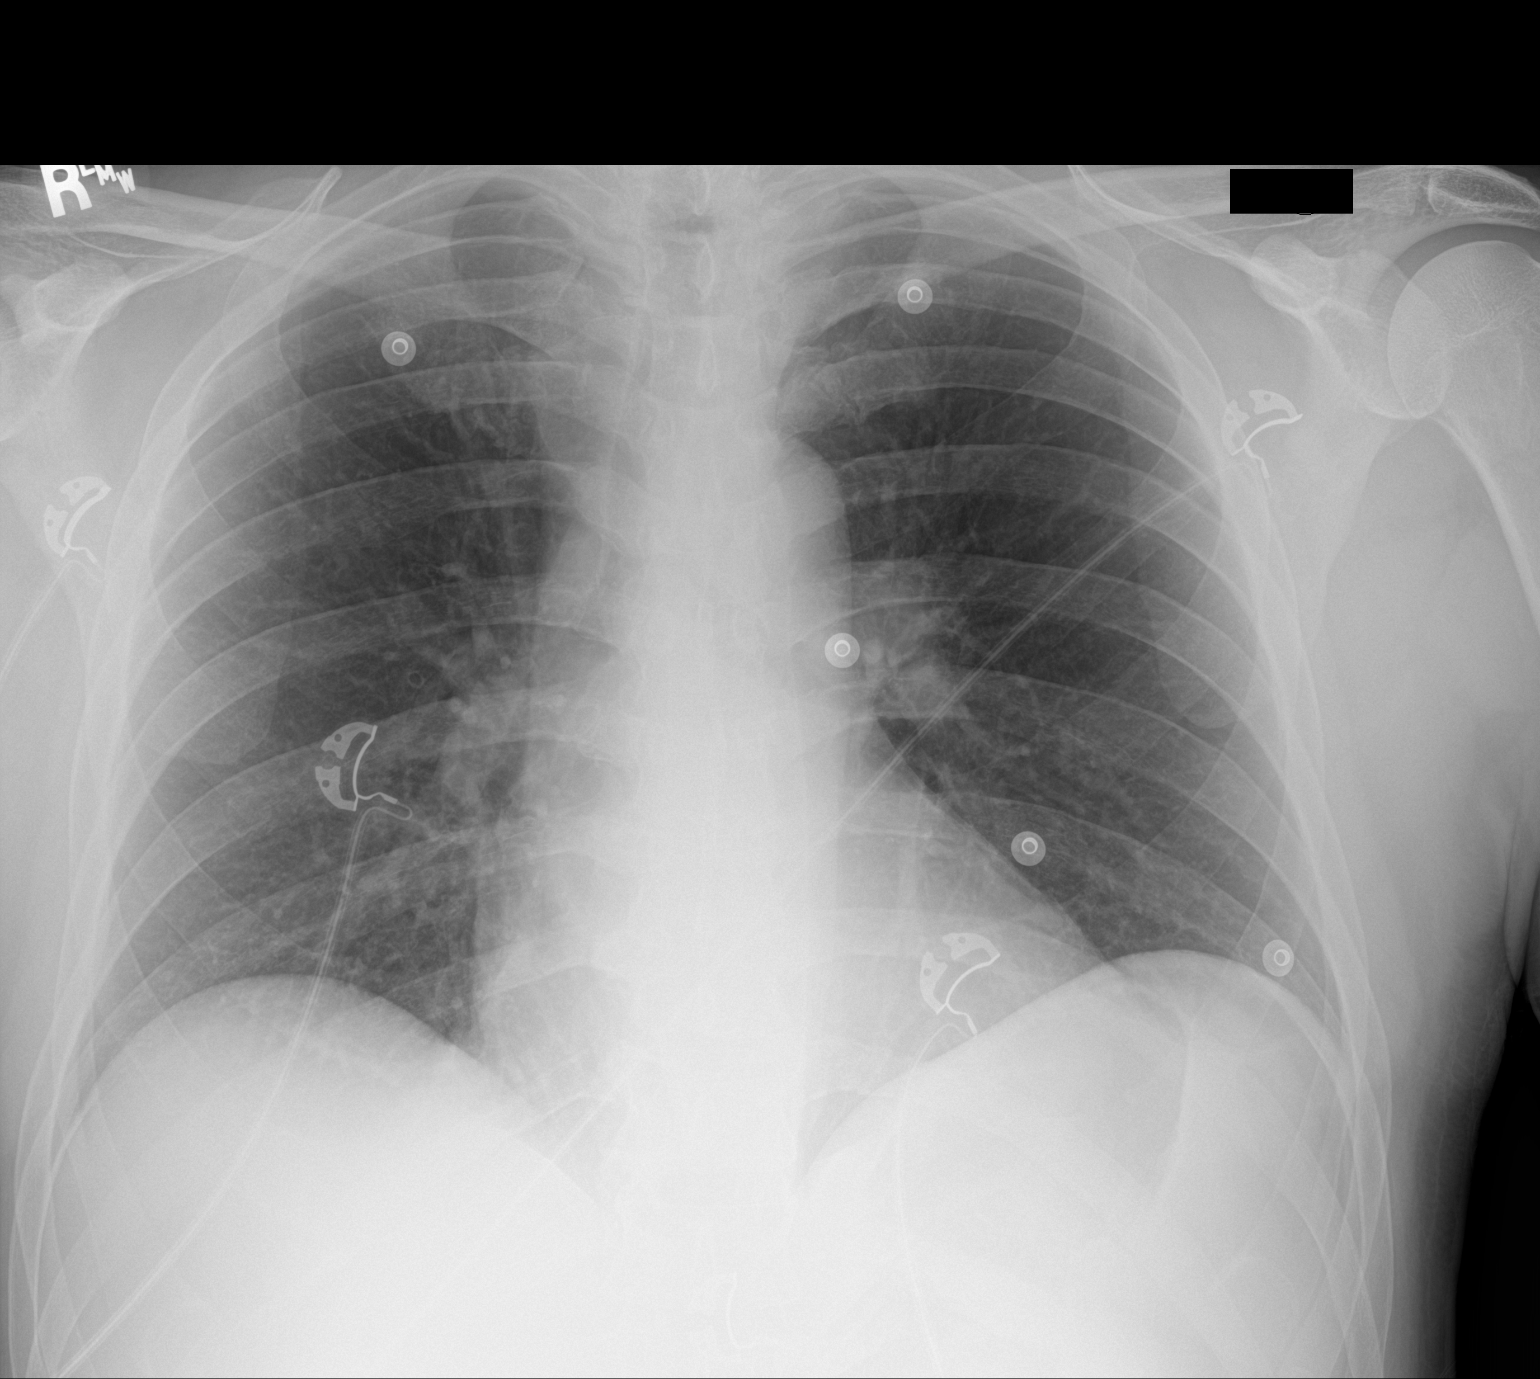

[1 of 1 positions shown; findings below may reference images not displayed]

FINDINGS: The cardiac silhouette is upper limits of normal in size. The lungs
are mildly hypoinflated without evidence of airspace consolidation,
edema, pleural effusion, or pneumothorax. No acute osseous
abnormality is seen.
IMPRESSION: No active disease.

## 2020-01-27 ENCOUNTER — Other Ambulatory Visit: Payer: Self-pay | Admitting: Internal Medicine

## 2020-01-29 ENCOUNTER — Other Ambulatory Visit: Payer: Self-pay | Admitting: Internal Medicine

## 2020-01-29 DIAGNOSIS — H0260 Xanthelasma of unspecified eye, unspecified eyelid: Secondary | ICD-10-CM

## 2020-02-29 ENCOUNTER — Other Ambulatory Visit: Payer: Self-pay | Admitting: Internal Medicine

## 2020-02-29 DIAGNOSIS — H0260 Xanthelasma of unspecified eye, unspecified eyelid: Secondary | ICD-10-CM

## 2020-04-11 ENCOUNTER — Other Ambulatory Visit: Payer: Self-pay | Admitting: Internal Medicine

## 2020-05-02 ENCOUNTER — Other Ambulatory Visit: Payer: Self-pay | Admitting: Internal Medicine

## 2020-07-22 ENCOUNTER — Other Ambulatory Visit: Payer: Self-pay | Admitting: Internal Medicine

## 2020-09-03 ENCOUNTER — Other Ambulatory Visit: Payer: Self-pay | Admitting: Internal Medicine

## 2020-10-08 ENCOUNTER — Other Ambulatory Visit: Payer: Self-pay | Admitting: Internal Medicine

## 2020-10-15 ENCOUNTER — Other Ambulatory Visit: Payer: Self-pay | Admitting: Internal Medicine

## 2020-11-08 ENCOUNTER — Encounter: Payer: Self-pay | Admitting: Family Medicine

## 2020-11-08 ENCOUNTER — Other Ambulatory Visit: Payer: Self-pay

## 2020-11-08 ENCOUNTER — Ambulatory Visit (INDEPENDENT_AMBULATORY_CARE_PROVIDER_SITE_OTHER): Payer: PRIVATE HEALTH INSURANCE | Admitting: Family Medicine

## 2020-11-08 VITALS — BP 118/64 | HR 64 | Ht 70.0 in | Wt 175.0 lb

## 2020-11-08 DIAGNOSIS — I25118 Atherosclerotic heart disease of native coronary artery with other forms of angina pectoris: Secondary | ICD-10-CM

## 2020-11-08 DIAGNOSIS — Z125 Encounter for screening for malignant neoplasm of prostate: Secondary | ICD-10-CM

## 2020-11-08 DIAGNOSIS — F419 Anxiety disorder, unspecified: Secondary | ICD-10-CM | POA: Diagnosis not present

## 2020-11-08 DIAGNOSIS — R0989 Other specified symptoms and signs involving the circulatory and respiratory systems: Secondary | ICD-10-CM

## 2020-11-08 DIAGNOSIS — I10 Essential (primary) hypertension: Secondary | ICD-10-CM

## 2020-11-08 NOTE — Assessment & Plan Note (Signed)
No recent anxiety, no anxiety medication taken.

## 2020-11-08 NOTE — Assessment & Plan Note (Signed)
Patient's blood pressure is within the desired range. Medication side effects include: no side effects noted Continue current treatment regimen.  

## 2020-11-08 NOTE — Assessment & Plan Note (Signed)
Bilat

## 2020-11-08 NOTE — Assessment & Plan Note (Signed)
Patient has been doing well, has occasional anginal pain that is relieved with rest, has not used NTG. He saw Cardiology yesterday. Last cath was in 2020.

## 2020-11-08 NOTE — Progress Notes (Signed)
Established Patient Office Visit  SUBJECTIVE:  Subjective  Patient ID: [name], male    DOB: August 16, 1966  Age: 55 y.o. MRN: [id number]  CC:  Chief Complaint  Patient presents with  . Hypertension    HPI [name] is a 55 y.o. male presenting today for     Past Medical History:  Diagnosis Date  . Anxiety   . CAD (coronary artery disease)   . HLD (hyperlipidemia)   . Hypertension   . Thrombocytopenia ([place])     Past Surgical History:  Procedure Laterality Date  . COLONOSCOPY WITH PROPOFOL N/A 12/12/2015   Procedure: COLONOSCOPY WITH PROPOFOL;  Surgeon: Manya Silvas, MD;  Location: [facility] ENDOSCOPY;  Service: Endoscopy;  Laterality: N/A;  . CORONARY STENT INTERVENTION N/A 02/24/2017   Procedure: Coronary Stent Intervention;  Surgeon: Isaias Cowman, MD;  Location: [place] [facility];  Service: Cardiovascular;  Laterality: N/A;  . CORONARY STENT INTERVENTION N/A 11/11/2018   Procedure: CORONARY STENT INTERVENTION;  Surgeon: Yolonda Kida, MD;  Location: [place] [facility];  Service: Cardiovascular;  Laterality: N/A;  . LEFT HEART CATH AND CORONARY ANGIOGRAPHY Left 02/24/2017   Procedure: Left Heart Cath and Coronary Angiography;  Surgeon: Isaias Cowman, MD;  Location: [place] [facility];  Service: Cardiovascular;  Laterality: Left;  . LEFT HEART CATH AND CORONARY ANGIOGRAPHY N/A 11/11/2018   Procedure: LEFT HEART CATH AND CORONARY ANGIOGRAPHY with possible pci and stent;  Surgeon: Yolonda Kida, MD;  Location: [place] [facility];  Service: Cardiovascular;  Laterality: N/A;  . stents      Family History  Problem Relation Age of Onset  . CAD Mother   . Hypertension Mother   . Cancer Father   . CAD Father     Social History   Socioeconomic History  . Marital status: Married    Spouse name: Not on file  . Number of children: Not on file  . Years of education: Not on file  . Highest education level: Not on file  Occupational History   . Not on file  Tobacco Use  . Smoking status: Current Every Day Smoker    Packs/day: 0.25    Years: 25.00    Pack years: 6.25    Types: Cigarettes  . Smokeless tobacco: Never Used  Substance and Sexual Activity  . Alcohol use: Yes    Alcohol/week: 8.0 standard drinks    Types: 8 Cans of beer per week  . Drug use: No  . Sexual activity: Not on file  Other Topics Concern  . Not on file  Social History Narrative  . Not on file   Social Determinants of Health   Financial Resource Strain: Not on file  Food Insecurity: Not on file  Transportation Needs: Not on file  Physical Activity: Not on file  Stress: Not on file  Social Connections: Not on file  Intimate Partner Violence: Not on file     Current Outpatient Medications:  .  aspirin 81 MG tablet, Take 81 mg by mouth daily. , Disp: , Rfl:  .  clopidogrel (PLAVIX) 75 MG tablet, TAKE ONE TABLET BY MOUTH ONCE DAILY, Disp: 30 tablet, Rfl: 4 .  fluticasone (FLONASE) 50 MCG/ACT nasal spray, Place 1 spray into both nostrils daily as needed for allergies or rhinitis., Disp: , Rfl:  .  loratadine (CLARITIN) 10 MG tablet, TAKE ONE TABLET BY MOUTH ONCE DAILY, Disp: 30 tablet, Rfl: 6 .  losartan-hydrochlorothiazide (HYZAAR) 100-25 MG tablet, Take 1  tablet by mouth daily., Disp: , Rfl:  .  Magnesium Oxide 400 (240 MG) MG TABS, One tab once per day (Patient taking differently: Take 400 mg by mouth daily. One tab once per day), Disp: 30 tablet, Rfl: 0 .  metoprolol succinate (TOPROL-XL) 50 MG 24 hr tablet, Take 50 mg by mouth daily. Take with or immediately following a meal., Disp: , Rfl:  .  potassium chloride (K-DUR,KLOR-CON) 10 MEQ tablet, Take 10 mEq by mouth daily., Disp: , Rfl:  .  potassium chloride (MICRO-K) 10 MEQ CR capsule, TAKE ONE CAPSULE BY MOUTH ONCE DAILY, Disp: 30 capsule, Rfl: 3 .  pravastatin (PRAVACHOL) 10 MG tablet, TAKE ONE TABLET BY MOUTH ONCE DAILY, Disp: 30 tablet, Rfl: 3 .  SUMAtriptan (IMITREX) 50 MG tablet, Take  50 mg by mouth every 2 (two) hours as needed for migraine. May repeat in 2 hours if headache persists or recurs., Disp: , Rfl:  .  zolpidem (AMBIEN) 5 MG tablet, Take 1 tablet (5 mg total) by mouth at bedtime., Disp: 30 tablet, Rfl: 0   Allergies  Allergen Reactions  . Lopid [Gemfibrozil]   . Statins Other (See Comments)    Pain and cramps    ROS Review of Systems  Constitutional: Negative.   HENT: Negative.   Eyes: Positive for visual disturbance.  Respiratory: Negative.   Cardiovascular: Negative.   Musculoskeletal: Negative.   Skin: Negative.   Neurological: Negative.   Psychiatric/Behavioral: Negative.      OBJECTIVE:    Physical Exam Constitutional:      Appearance: Normal appearance.  HENT:     Head: Normocephalic.     Right Ear: Tympanic membrane normal.     Left Ear: Tympanic membrane normal.  Cardiovascular:     Rate and Rhythm: Normal rate and regular rhythm.     Pulses:          Carotid pulses are 1+ on the right side with bruit and 1+ on the left side with bruit. Pulmonary:     Effort: Pulmonary effort is normal.  Abdominal:     General: Abdomen is flat.  Neurological:     Mental Status: He is alert.  Psychiatric:        Mood and Affect: Mood normal.     BP 118/64   Pulse 64   Ht 5\' 10"  (1.778 m)   Wt 175 lb (79.4 kg)   BMI 25.11 kg/m  Wt Readings from Last 3 Encounters:  11/08/20 175 lb (79.4 kg)  11/10/18 170 lb (77.1 kg)  02/24/17 168 lb (76.2 kg)    Health Maintenance Due  Topic Date Due  . Hepatitis C Screening  Never done  . COVID-19 Vaccine (1) Never done  . INFLUENZA VACCINE  Never done    There are no preventive care reminders to display for this patient.  CBC Latest Ref Rng & Units 11/11/2018 11/10/2018 02/25/2017  WBC 4.0 - 10.5 K/uL 8.0 8.6 7.8  Hemoglobin 13.0 - 17.0 g/dL 16.2 16.9 16.6  Hematocrit 39.0 - 52.0 % 45.0 47.9 46.5  Platelets 150 - 400 K/uL 153 158 143(L)   CMP Latest Ref Rng & Units 11/11/2018 11/10/2018  02/25/2017  Glucose 70 - 99 mg/dL 99 148(H) 98  BUN 6 - 20 mg/dL 12 13 8   Creatinine 0.61 - 1.24 mg/dL 0.63 0.98 0.78  Sodium 135 - 145 mmol/L 141 141 139  Potassium 3.5 - 5.1 mmol/L 3.5 2.9(L) 3.1(L)  Chloride 98 - 111 mmol/L 110 100 103  CO2 22 - 32 mmol/L 24 29 30   Calcium 8.9 - 10.3 mg/dL 8.8(L) 9.2 8.7(L)  Total Protein 6.5 - 8.1 g/dL - 7.4 -  Total Bilirubin 0.3 - 1.2 mg/dL - 1.5(H) -  Alkaline Phos 38 - 126 U/L - 70 -  AST 15 - 41 U/L - 32 -  ALT 0 - 44 U/L - 52(H) -    No results found for: TSH Lab Results  Component Value Date   ALBUMIN 4.4 11/10/2018   ANIONGAP 7 11/11/2018   No results found for: CHOL, HDL, LDLCALC, CHOLHDL No results found for: TRIG No results found for: HGBA1C    ASSESSMENT & PLAN:   Problem List Items Addressed This Visit      Cardiovascular and Mediastinum   CAD (coronary artery disease)    Patient has been doing well, has occasional anginal pain that is relieved with rest, has not used NTG. He saw Cardiology yesterday. Last cath was in 2020.       Relevant Orders   Lipid Profile   HTN (hypertension) - Primary    Patient's blood pressure is within the desired range. Medication side effects include: no side effects noted Continue current treatment regimen.       Relevant Orders   CBC   COMPLETE METABOLIC PANEL WITH GFR     Other   Anxiety    No recent anxiety, no anxiety medication taken.       Bilateral carotid bruits    Bilat       Other Visit Diagnoses    Screening PSA (prostate specific antigen)       Relevant Orders   PSA, total and free      No orders of the defined types were placed in this encounter.     Follow-up: No follow-ups on file.    Beckie Salts, Dora [address], [place], [place] [address]

## 2020-11-11 LAB — COMPLETE METABOLIC PANEL WITH GFR
AG Ratio: 1.7 (calc) (ref 1.0–2.5)
ALT: 62 U/L — ABNORMAL HIGH (ref 9–46)
AST: 43 U/L — ABNORMAL HIGH (ref 10–35)
Albumin: 4.3 g/dL (ref 3.6–5.1)
Alkaline phosphatase (APISO): 86 U/L (ref 35–144)
BUN: 10 mg/dL (ref 7–25)
CO2: 26 mmol/L (ref 20–32)
Calcium: 10 mg/dL (ref 8.6–10.3)
Chloride: 103 mmol/L (ref 98–110)
Creat: 0.79 mg/dL (ref 0.70–1.33)
GFR, Est African American: 116 mL/min/{1.73_m2} (ref >=60)
GFR, Est Non African American: 100 mL/min/{1.73_m2} (ref >=60)
Globulin: 2.6 g/dL (calc) (ref 1.9–3.7)
Glucose, Bld: 100 mg/dL — ABNORMAL HIGH (ref 65–99)
Potassium: 3.5 mmol/L (ref 3.5–5.3)
Sodium: 142 mmol/L (ref 135–146)
Total Bilirubin: 1.1 mg/dL (ref 0.2–1.2)
Total Protein: 6.9 g/dL (ref 6.1–8.1)

## 2020-11-11 LAB — PSA, TOTAL AND FREE
PSA, % Free: 33 % (calc) (ref >25)
PSA, Free: 0.2 ng/mL
PSA, Total: 0.6 ng/mL (ref <=4.0)

## 2020-11-11 LAB — LIPID PANEL
Cholesterol: 204 mg/dL — ABNORMAL HIGH (ref <200)
HDL: 36 mg/dL — ABNORMAL LOW (ref >=40)
Non-HDL Cholesterol (Calc): 168 mg/dL (calc) — ABNORMAL HIGH (ref <130)
Total CHOL/HDL Ratio: 5.7 (calc) — ABNORMAL HIGH (ref <5.0)
Triglycerides: 499 mg/dL — ABNORMAL HIGH (ref <150)

## 2020-11-11 LAB — EXTRA LAV TOP TUBE

## 2020-11-12 ENCOUNTER — Other Ambulatory Visit (INDEPENDENT_AMBULATORY_CARE_PROVIDER_SITE_OTHER): Payer: PRIVATE HEALTH INSURANCE

## 2020-11-12 ENCOUNTER — Other Ambulatory Visit: Payer: Self-pay

## 2020-11-12 DIAGNOSIS — E782 Mixed hyperlipidemia: Secondary | ICD-10-CM

## 2020-11-12 DIAGNOSIS — R0989 Other specified symptoms and signs involving the circulatory and respiratory systems: Secondary | ICD-10-CM | POA: Diagnosis not present

## 2020-11-12 DIAGNOSIS — I25118 Atherosclerotic heart disease of native coronary artery with other forms of angina pectoris: Secondary | ICD-10-CM | POA: Diagnosis not present

## 2020-11-12 NOTE — Assessment & Plan Note (Signed)
-   The patient's hyperlipidemia is stable on ststin- The patient will continue the current treatment regimen.  - I encouraged the patient to eat more vegetables and whole wheat, and to avoid fatty foods like whole milk, hard cheese, egg yolks, margarine, baked sweets, and fried foods.  - I encouraged the patient to live an active lifestyle and complete activities for 40 minutes at least three times per week.  - I instructed the patient to go to the ER if they begin having chest pain.   

## 2020-11-12 NOTE — Assessment & Plan Note (Signed)
Patient was advised to continue using his statin and stop smoking completely.

## 2020-11-12 NOTE — Progress Notes (Unsigned)
Established Patient Office Visit  Subjective:  Patient ID: [name], male    DOB: [birth year]  Age: 55 y.o. MRN: [id number]  CC: No chief complaint on file.   HPI  [name] presents for carotid evaluation. .  He has a mild bruit in the right side there is no history of syncope.  There is no history of passing out spell.  Past Medical History:  Diagnosis Date   Anxiety    CAD (coronary artery disease)    HLD (hyperlipidemia)    Hypertension    Thrombocytopenia ([place])     Past Surgical History:  Procedure Laterality Date   COLONOSCOPY WITH PROPOFOL N/A 12/12/2015   Procedure: COLONOSCOPY WITH PROPOFOL;  Surgeon: Manya Silvas, MD;  Location: [facility];  Service: Endoscopy;  Laterality: N/A;   CORONARY STENT INTERVENTION N/A 02/24/2017   Procedure: Coronary Stent Intervention;  Surgeon: Isaias Cowman, MD;  Location: [place] [facility];  Service: Cardiovascular;  Laterality: N/A;   CORONARY STENT INTERVENTION N/A 11/11/2018   Procedure: CORONARY STENT INTERVENTION;  Surgeon: Yolonda Kida, MD;  Location: [place] [facility];  Service: Cardiovascular;  Laterality: N/A;   LEFT HEART CATH AND CORONARY ANGIOGRAPHY Left 02/24/2017   Procedure: Left Heart Cath and Coronary Angiography;  Surgeon: Isaias Cowman, MD;  Location: [place] [facility];  Service: Cardiovascular;  Laterality: Left;   LEFT HEART CATH AND CORONARY ANGIOGRAPHY N/A 11/11/2018   Procedure: LEFT HEART CATH AND CORONARY ANGIOGRAPHY with possible pci and stent;  Surgeon: Yolonda Kida, MD;  Location: [place] [facility];  Service: Cardiovascular;  Laterality: N/A;   stents      Family History  Problem Relation Age of Onset   CAD Mother    Hypertension Mother    Cancer Father    CAD Father     Social History   Socioeconomic History   Marital status: Married    Spouse name: Not on file   Number of children: Not on file   Years of education: Not on file    Highest education level: Not on file  Occupational History   Not on file  Tobacco Use   Smoking status: Current Every Day Smoker    Packs/day: 0.25    Years: 25.00    Pack years: 6.25    Types: Cigarettes   Smokeless tobacco: Never Used  Substance and Sexual Activity   Alcohol use: Yes    Alcohol/week: 8.0 standard drinks    Types: 8 Cans of beer per week   Drug use: No   Sexual activity: Not on file  Other Topics Concern   Not on file  Social History Narrative   Not on file   Social Determinants of Health   Financial Resource Strain: Not on file  Food Insecurity: Not on file  Transportation Needs: Not on file  Physical Activity: Not on file  Stress: Not on file  Social Connections: Not on file  Intimate Partner Violence: Not on file     Current Outpatient Medications:    aspirin 81 MG tablet, Take 81 mg by mouth daily. , Disp: , Rfl:    clopidogrel (PLAVIX) 75 MG tablet, TAKE ONE TABLET BY MOUTH ONCE DAILY, Disp: 30 tablet, Rfl: 4   fluticasone (FLONASE) 50 MCG/ACT nasal spray, Place 1 spray into both nostrils daily as needed for allergies or rhinitis., Disp: , Rfl:    loratadine (CLARITIN) 10 MG tablet, TAKE ONE TABLET BY MOUTH ONCE DAILY, Disp: 30  tablet, Rfl: 6   losartan-hydrochlorothiazide (HYZAAR) 100-25 MG tablet, Take 1 tablet by mouth daily., Disp: , Rfl:    Magnesium Oxide 400 (240 MG) MG TABS, One tab once per day (Patient taking differently: Take 400 mg by mouth daily. One tab once per day), Disp: 30 tablet, Rfl: 0   metoprolol succinate (TOPROL-XL) 50 MG 24 hr tablet, Take 50 mg by mouth daily. Take with or immediately following a meal., Disp: , Rfl:    potassium chloride (K-DUR,KLOR-CON) 10 MEQ tablet, Take 10 mEq by mouth daily., Disp: , Rfl:    potassium chloride (MICRO-K) 10 MEQ CR capsule, TAKE ONE CAPSULE BY MOUTH ONCE DAILY, Disp: 30 capsule, Rfl: 3   pravastatin (PRAVACHOL) 10 MG tablet, TAKE ONE TABLET BY MOUTH ONCE DAILY, Disp:  30 tablet, Rfl: 3   SUMAtriptan (IMITREX) 50 MG tablet, Take 50 mg by mouth every 2 (two) hours as needed for migraine. May repeat in 2 hours if headache persists or recurs., Disp: , Rfl:    zolpidem (AMBIEN) 5 MG tablet, Take 1 tablet (5 mg total) by mouth at bedtime., Disp: 30 tablet, Rfl: 0   Allergies  Allergen Reactions   Lopid [Gemfibrozil]    Statins Other (See Comments)    Pain and cramps    ROS Review of Systems  Eyes: Negative.   Respiratory: Negative.   Cardiovascular: Negative.       Objective:    Physical Exam Cardiovascular:     Rate and Rhythm: Normal rate and regular rhythm.     Pulses: Normal pulses.  Pulmonary:     Effort: Pulmonary effort is normal.  Abdominal:     General: Abdomen is flat.     There were no vitals taken for this visit. Wt Readings from Last 3 Encounters:  11/08/20 175 lb (79.4 kg)  11/10/18 170 lb (77.1 kg)  02/24/17 168 lb (76.2 kg)     Health Maintenance Due  Topic Date Due   Hepatitis C Screening  Never done   COVID-19 Vaccine (1) Never done   INFLUENZA VACCINE  Never done    There are no preventive care reminders to display for this patient.  No results found for: TSH Lab Results  Component Value Date   WBC 8.0 11/11/2018   HGB 16.2 11/11/2018   HCT 45.0 11/11/2018   MCV 91.1 11/11/2018   PLT 153 11/11/2018   Lab Results  Component Value Date   NA 142 11/08/2020   K 3.5 11/08/2020   CO2 26 11/08/2020   GLUCOSE 100 (H) 11/08/2020   BUN 10 11/08/2020   CREATININE 0.79 11/08/2020   BILITOT 1.1 11/08/2020   ALKPHOS 70 11/10/2018   AST 43 (H) 11/08/2020   ALT 62 (H) 11/08/2020   PROT 6.9 11/08/2020   ALBUMIN 4.4 11/10/2018   CALCIUM 10.0 11/08/2020   ANIONGAP 7 11/11/2018   Lab Results  Component Value Date   CHOL 204 (H) 11/08/2020   Lab Results  Component Value Date   HDL 36 (L) 11/08/2020   Lab Results  Component Value Date   [facility]  11/08/2020     Comment:     . LDL cholesterol not  calculated. Triglyceride levels greater than 400 mg/dL invalidate calculated LDL results. . Reference range: <100 . Desirable range <100 mg/dL for primary prevention;   <70 mg/dL for patients with CHD or diabetic patients  with > or = 2 CHD risk factors. Marland Kitchen LDL-C is now calculated using the Martin-Hopkins  calculation, which is  a validated novel method providing  better accuracy than the Friedewald equation in the  estimation of LDL-C.  Cresenciano Genre et al. Annamaria Helling. 8832;549(82): 2061-2068  (http://education.[employer].com/faq/FAQ164)    Lab Results  Component Value Date   TRIG 499 (H) 11/08/2020   Lab Results  Component Value Date   CHOLHDL 5.7 (H) 11/08/2020   No results found for: HGBA1C    Assessment & Plan:   Problem List Items Addressed This Visit      Cardiovascular and Mediastinum   Coronary artery disease of native artery of native heart with stable angina pectoris ([place])    Patient was advised to continue using his statin and stop smoking completely.        Other   HLD (hyperlipidemia)    - The patient's hyperlipidemia is stable on ststin. - The patient will continue the current treatment regimen.  - I encouraged the patient to eat more vegetables and whole wheat, and to avoid fatty foods like whole milk, hard cheese, egg yolks, margarine, baked sweets, and fried foods.  - I encouraged the patient to live an active lifestyle and complete activities for 40 minutes at least three times per week.  - I instructed the patient to go to the ER if they begin having chest pain.        Bilateral carotid bruits - Primary    Patient was found to 45% plaque in the right carotid artery left carotid is okay with some intimal thickening.  Patient was advised statin, also advised to use baby aspirin 1 a day.  He was also advised to stop smoking          [facility] [place], [place]: 2.22/22  CAROTID DOPPLER INTERPRETATION:  Bilateral  Carotid Ultrsasound and Color Doppler Examination was performed. The RIGHT CCA shows mildplaque in the vessel. The LEFT CCA shows no plaque in the vessel  There was  intimal thickening noted in the RIGHT carotid artery. There was minimal intimal thickening in the LEFT carotid artery.     Impression:    The RIGHT CAROTID shows mild plaque in the middle of the right carotid artery. The LEFT CAROTID shows no plaque.. Consider a repeat Carotid doppler if clinical situation and symptoms warrant in 12 months. Patient should be encouraged to change lifestyles such as smoking cessation, regular exercise and dietary modification. Use of statins in the right clinical setting and ASA is encouraged.     No orders of the defined types were placed in this encounter.   Follow-up: No follow-ups on file.    Cletis Athens, MD

## 2020-11-12 NOTE — Assessment & Plan Note (Signed)
Patient was found to 45% plaque in the right carotid artery left carotid is okay with some intimal thickening.  Patient was advised statin, also advised to use baby aspirin 1 a day.  He was also advised to stop smoking

## 2021-01-13 ENCOUNTER — Other Ambulatory Visit: Payer: Self-pay | Admitting: Internal Medicine

## 2021-02-10 ENCOUNTER — Other Ambulatory Visit: Payer: Self-pay | Admitting: Internal Medicine

## 2021-05-06 ENCOUNTER — Other Ambulatory Visit: Payer: Self-pay | Admitting: Internal Medicine

## 2021-06-06 ENCOUNTER — Other Ambulatory Visit: Payer: Self-pay | Admitting: Internal Medicine

## 2021-07-01 ENCOUNTER — Other Ambulatory Visit: Payer: Self-pay | Admitting: Internal Medicine

## 2021-08-01 ENCOUNTER — Other Ambulatory Visit: Payer: Self-pay | Admitting: Internal Medicine

## 2021-08-27 ENCOUNTER — Other Ambulatory Visit: Payer: Self-pay | Admitting: Internal Medicine

## 2021-10-13 ENCOUNTER — Other Ambulatory Visit: Payer: Self-pay | Admitting: Internal Medicine

## 2021-11-03 ENCOUNTER — Other Ambulatory Visit: Payer: Self-pay | Admitting: Internal Medicine

## 2021-11-03 DIAGNOSIS — B350 Tinea barbae and tinea capitis: Secondary | ICD-10-CM

## 2022-01-10 ENCOUNTER — Other Ambulatory Visit: Payer: Self-pay | Admitting: Internal Medicine

## 2022-02-09 ENCOUNTER — Other Ambulatory Visit: Payer: Self-pay | Admitting: Internal Medicine

## 2022-03-14 ENCOUNTER — Other Ambulatory Visit: Payer: Self-pay | Admitting: Internal Medicine

## 2022-04-11 ENCOUNTER — Other Ambulatory Visit: Payer: Self-pay | Admitting: Internal Medicine

## 2022-05-09 ENCOUNTER — Other Ambulatory Visit: Payer: Self-pay | Admitting: Internal Medicine

## 2022-05-19 ENCOUNTER — Encounter: Payer: Self-pay | Admitting: Internal Medicine

## 2022-05-19 ENCOUNTER — Ambulatory Visit (INDEPENDENT_AMBULATORY_CARE_PROVIDER_SITE_OTHER): Payer: No Typology Code available for payment source | Admitting: Internal Medicine

## 2022-05-19 DIAGNOSIS — I25118 Atherosclerotic heart disease of native coronary artery with other forms of angina pectoris: Secondary | ICD-10-CM | POA: Diagnosis not present

## 2022-05-19 DIAGNOSIS — E782 Mixed hyperlipidemia: Secondary | ICD-10-CM

## 2022-05-19 DIAGNOSIS — I209 Angina pectoris, unspecified: Secondary | ICD-10-CM

## 2022-05-19 DIAGNOSIS — F419 Anxiety disorder, unspecified: Secondary | ICD-10-CM | POA: Diagnosis not present

## 2022-05-19 DIAGNOSIS — R0989 Other specified symptoms and signs involving the circulatory and respiratory systems: Secondary | ICD-10-CM

## 2022-05-19 DIAGNOSIS — I10 Essential (primary) hypertension: Secondary | ICD-10-CM | POA: Diagnosis not present

## 2022-05-19 MED ORDER — LOSARTAN POTASSIUM-HCTZ 100-25 MG PO TABS: 1.0000 | ORAL_TABLET | Freq: Every day | ORAL | 3 refills | Status: DC

## 2022-05-19 MED ORDER — CLOPIDOGREL BISULFATE 75 MG PO TABS: 75.0000 mg | ORAL_TABLET | Freq: Every day | ORAL | 1 refills | Status: AC

## 2022-05-19 NOTE — Assessment & Plan Note (Signed)
Hypercholesterolemia  I advised the patient to follow Mediterranean diet This diet is rich in fruits vegetables and whole grain, and This diet is also rich in fish and lean meat Patient should also eat a handful of almonds or walnuts daily Recent heart study indicated that average follow-up on this kind of diet reduces the cardiovascular mortality by 50 to 70%== 

## 2022-05-19 NOTE — Assessment & Plan Note (Addendum)
?-   Encouraged patient to engage in relaxing activities like yoga, meditation, journaling, going for a walk, or participating in a hobby.  ?- Encouraged patient to reach out to trusted friends or family members about recent struggles, ?Patient was advised to read A book, how to stop worrying and start living, it is good book to read to control  the stress ? ?

## 2022-05-19 NOTE — Assessment & Plan Note (Signed)
Stable at the present time. 

## 2022-05-19 NOTE — Addendum Note (Signed)
Addended by: Cletis Athens on: 05/19/2022 04:05 PM   Modules accepted: Orders

## 2022-05-19 NOTE — Progress Notes (Signed)
Established Patient Office Visit  Subjective:  Patient ID: [name], male    DOB: 08-25-1966  Age: 56 y.o. MRN: [id number]  CC:  Chief Complaint  Patient presents with   Medication Consultation    Potassium- pt is concerned as he is having some nausea, notes potassium can cause dark stools didn't know if the two may be related     HPI  [name] presents for check up  Past Medical History:  Diagnosis Date   Anxiety    CAD (coronary artery disease)    HLD (hyperlipidemia)    Hypertension    Thrombocytopenia ([place])     Past Surgical History:  Procedure Laterality Date   COLONOSCOPY WITH PROPOFOL N/A 12/12/2015   Procedure: COLONOSCOPY WITH PROPOFOL;  Surgeon: Manya Silvas, MD;  Location: [facility];  Service: Endoscopy;  Laterality: N/A;   CORONARY STENT INTERVENTION N/A 02/24/2017   Procedure: Coronary Stent Intervention;  Surgeon: Isaias Cowman, MD;  Location: [facility];  Service: Cardiovascular;  Laterality: N/A;   CORONARY STENT INTERVENTION N/A 11/11/2018   Procedure: CORONARY STENT INTERVENTION;  Surgeon: Yolonda Kida, MD;  Location: [facility];  Service: Cardiovascular;  Laterality: N/A;   LEFT HEART CATH AND CORONARY ANGIOGRAPHY Left 02/24/2017   Procedure: Left Heart Cath and Coronary Angiography;  Surgeon: Isaias Cowman, MD;  Location: [facility];  Service: Cardiovascular;  Laterality: Left;   LEFT HEART CATH AND CORONARY ANGIOGRAPHY N/A 11/11/2018   Procedure: LEFT HEART CATH AND CORONARY ANGIOGRAPHY with possible pci and stent;  Surgeon: Yolonda Kida, MD;  Location: [facility];  Service: Cardiovascular;  Laterality: N/A;   stents      Family History  Problem Relation Age of Onset   CAD Mother    Hypertension Mother    Cancer Father    CAD Father     Social History   Socioeconomic History   Marital status: Married    Spouse name: Not on file   Number of children: Not on file    Years of education: Not on file   Highest education level: Not on file  Occupational History   Not on file  Tobacco Use   Smoking status: Every Day    Packs/day: 0.25    Years: 25.00    Total pack years: 6.25    Types: Cigarettes   Smokeless tobacco: Never  Substance and Sexual Activity   Alcohol use: Yes    Alcohol/week: 8.0 standard drinks of alcohol    Types: 8 Cans of beer per week   Drug use: No   Sexual activity: Not on file  Other Topics Concern   Not on file  Social History Narrative   Not on file   Social Determinants of Health   Financial Resource Strain: Not on file  Food Insecurity: Not on file  Transportation Needs: Not on file  Physical Activity: Not on file  Stress: Not on file  Social Connections: Not on file  Intimate Partner Violence: Not on file     Current Outpatient Medications:    amLODipine (NORVASC) 10 MG tablet, Take 1 tablet by mouth daily., Disp: , Rfl:    aspirin 81 MG tablet, Take 81 mg by mouth daily. , Disp: , Rfl:    metoprolol succinate (TOPROL-XL) 50 MG 24 hr tablet, Take 50 mg by mouth daily. Take with or immediately following a meal., Disp: , Rfl:    potassium chloride (MICRO-K) 10 MEQ  CR capsule, TAKE (1) CAPSULE DAILY., Disp: 30 capsule, Rfl: 0   pravastatin (PRAVACHOL) 10 MG tablet, TAKE 1 TABLET DAILY., Disp: 30 tablet, Rfl: 0   clopidogrel (PLAVIX) 75 MG tablet, Take 1 tablet (75 mg total) by mouth daily., Disp: 90 tablet, Rfl: 1   fluticasone (FLONASE) 50 MCG/ACT nasal spray, Place 1 spray into both nostrils daily as needed for allergies or rhinitis. (Patient not taking: Reported on 05/19/2022), Disp: , Rfl:    ketoconazole (NIZORAL) 2 % shampoo, APPLY TOPICALLY ONCE FOR 1 DOSE. LEAVE ON FOR 5 MINUTES, THEN RINSE (Patient not taking: Reported on 05/19/2022), Disp: 120 mL, Rfl: 2   loratadine (CLARITIN) 10 MG tablet, TAKE ONE TABLET BY MOUTH ONCE DAILY (Patient not taking: Reported on 05/19/2022), Disp: 30 tablet, Rfl: 6   Magnesium  Oxide 400 (240 MG) MG TABS, One tab once per day (Patient not taking: Reported on 05/19/2022), Disp: 30 tablet, Rfl: 0   Allergies  Allergen Reactions   Lopid [Gemfibrozil]    Statins Other (See Comments)    Pain and cramps    ROS Review of Systems  Constitutional: Negative.   HENT: Negative.    Eyes: Negative.   Respiratory: Negative.    Cardiovascular: Negative.   Gastrointestinal: Negative.   Endocrine: Negative.   Genitourinary: Negative.   Musculoskeletal: Negative.   Skin: Negative.   Allergic/Immunologic: Negative.   Neurological: Negative.   Hematological: Negative.   Psychiatric/Behavioral: Negative.    All other systems reviewed and are negative.     Objective:    Physical Exam Vitals reviewed.  Constitutional:      Appearance: Normal appearance.  HENT:     Mouth/Throat:     Mouth: Mucous membranes are moist.  Eyes:     Pupils: Pupils are equal, round, and reactive to light.  Neck:     Vascular: Carotid bruit (bilateral carotid bruit) present.  Cardiovascular:     Rate and Rhythm: Normal rate and regular rhythm.     Pulses: Normal pulses.     Heart sounds: Normal heart sounds.  Pulmonary:     Effort: Pulmonary effort is normal.     Breath sounds: Normal breath sounds.  Abdominal:     General: Bowel sounds are normal.     Palpations: Abdomen is soft. There is no hepatomegaly, splenomegaly or mass.     Tenderness: There is no abdominal tenderness.     Hernia: No hernia is present.  Musculoskeletal:       Arms:     Cervical back: Neck supple.     Right lower leg: No edema.     Left lower leg: No edema.  Skin:    General: Skin is warm.     Findings: No rash.  Neurological:     Mental Status: He is alert and oriented to person, place, and time.     Motor: No weakness.  Psychiatric:        Mood and Affect: Mood normal.        Behavior: Behavior normal.     There were no vitals taken for this visit. Wt Readings from Last 3 Encounters:   11/08/20 175 lb (79.4 kg)  11/10/18 170 lb (77.1 kg)  02/24/17 168 lb (76.2 kg)     There are no preventive care reminders to display for this patient.  There are no preventive care reminders to display for this patient.  No results found for: "TSH" Lab Results  Component Value Date   WBC 8.0 11/11/2018  HGB 16.2 11/11/2018   HCT 45.0 11/11/2018   MCV 91.1 11/11/2018   PLT 153 11/11/2018   Lab Results  Component Value Date   NA 142 11/08/2020   K 3.5 11/08/2020   CO2 26 11/08/2020   GLUCOSE 100 (H) 11/08/2020   BUN 10 11/08/2020   CREATININE 0.79 11/08/2020   BILITOT 1.1 11/08/2020   ALKPHOS 70 11/10/2018   AST 43 (H) 11/08/2020   ALT 62 (H) 11/08/2020   PROT 6.9 11/08/2020   ALBUMIN 4.4 11/10/2018   CALCIUM 10.0 11/08/2020   ANIONGAP 7 11/11/2018   Lab Results  Component Value Date   CHOL 204 (H) 11/08/2020   Lab Results  Component Value Date   HDL 36 (L) 11/08/2020   Lab Results  Component Value Date   [facility]  11/08/2020     Comment:     . LDL cholesterol not calculated. Triglyceride levels greater than 400 mg/dL invalidate calculated LDL results. . Reference range: <100 . Desirable range <100 mg/dL for primary prevention;   <70 mg/dL for patients with CHD or diabetic patients  with > or = 2 CHD risk factors. Marland Kitchen LDL-C is now calculated using the Martin-Hopkins  calculation, which is a validated novel method providing  better accuracy than the Friedewald equation in the  estimation of LDL-C.  Cresenciano Genre et al. Annamaria Helling. 3299;242(68): 2061-2068  (http://education.[employer].com/faq/FAQ164)    Lab Results  Component Value Date   TRIG 499 (H) 11/08/2020   Lab Results  Component Value Date   CHOLHDL 5.7 (H) 11/08/2020   No results found for: "HGBA1C"    Assessment & Plan:   Problem List Items Addressed This Visit       Cardiovascular and Mediastinum   Coronary artery disease of native artery of native heart with stable angina  pectoris ([place])    Hypercholesterolemia  I advised the patient to follow Mediterranean diet This diet is rich in fruits vegetables and whole grain, and This diet is also rich in fish and lean meat Patient should also eat a handful of almonds or walnuts daily Recent heart study indicated that average follow-up on this kind of diet reduces the cardiovascular mortality by 50 to 70%==      Relevant Medications   amLODipine (NORVASC) 10 MG tablet   Primary hypertension     Patient denies any chest pain or shortness of breath there is no history of palpitation or paroxysmal nocturnal dyspnea   patient was advised to follow low-salt low-cholesterol diet    ideally I want to keep systolic blood pressure below 130 mmHg, patient was asked to check blood pressure one times a week and give me a report on that.  Patient will be follow-up in 3 months  or earlier as needed, patient will call me back for any change in the cardiovascular symptoms Patient was advised to buy a book from local bookstore concerning blood pressure and read several chapters  every day.  This will be supplemented by some of the material we will give him from the office.  Patient should also utilize other resources like YouTube and Internet to learn more about the blood pressure and the diet.      Relevant Medications   amLODipine (NORVASC) 10 MG tablet     Other   Ischemic chest pain (HCC)    Stable at the present time      Anxiety    - - Encouraged patient to engage in relaxing activities like yoga, meditation, journaling, going for  a walk, or participating in a hobby.  - Encouraged patient to reach out to trusted friends or family members about recent struggles, Patient was advised to read A book, how to stop worrying and start living, it is good book to read to control  the stress       Bilateral carotid bruits - Primary    Stable at the present time      Relevant Medications   clopidogrel (PLAVIX) 75 MG tablet    Moderate mixed hyperlipidemia not requiring statin therapy    Hypercholesterolemia  I advised the patient to follow Mediterranean diet This diet is rich in fruits vegetables and whole grain, and This diet is also rich in fish and lean meat Patient should also eat a handful of almonds or walnuts daily Recent heart study indicated that average follow-up on this kind of diet reduces the cardiovascular mortality by 50 to 70%==      Relevant Medications   amLODipine (NORVASC) 10 MG tablet   Other Relevant Orders   Comprehensive metabolic panel   Lipid panel   Hemoglobin A1c    Meds ordered this encounter  Medications   DISCONTD: losartan-hydrochlorothiazide (HYZAAR) 100-25 MG tablet    Sig: Take 1 tablet by mouth daily.    Dispense:  90 tablet    Refill:  3   clopidogrel (PLAVIX) 75 MG tablet    Sig: Take 1 tablet (75 mg total) by mouth daily.    Dispense:  90 tablet    Refill:  1    Follow-up: No follow-ups on file.    Cletis Athens, MD

## 2022-05-19 NOTE — Progress Notes (Deleted)
Established Patient Office Visit  Subjective:  Patient ID: [name], male    DOB: May 03, 1966  Age: 56 y.o. MRN: [id number]  CC:  Chief Complaint  Patient presents with   Medication Consultation    Potassium- pt is concerned as he is having some nausea, notes potassium can cause dark stools didn't know if the two may be related     HPI  [name] presents for check up  Past Medical History:  Diagnosis Date   Anxiety    CAD (coronary artery disease)    HLD (hyperlipidemia)    Hypertension    Thrombocytopenia ([place])     Past Surgical History:  Procedure Laterality Date   COLONOSCOPY WITH PROPOFOL N/A 12/12/2015   Procedure: COLONOSCOPY WITH PROPOFOL;  Surgeon: Manya Silvas, MD;  Location: [facility];  Service: Endoscopy;  Laterality: N/A;   CORONARY STENT INTERVENTION N/A 02/24/2017   Procedure: Coronary Stent Intervention;  Surgeon: Isaias Cowman, MD;  Location: [facility];  Service: Cardiovascular;  Laterality: N/A;   CORONARY STENT INTERVENTION N/A 11/11/2018   Procedure: CORONARY STENT INTERVENTION;  Surgeon: Yolonda Kida, MD;  Location: [facility];  Service: Cardiovascular;  Laterality: N/A;   LEFT HEART CATH AND CORONARY ANGIOGRAPHY Left 02/24/2017   Procedure: Left Heart Cath and Coronary Angiography;  Surgeon: Isaias Cowman, MD;  Location: [facility];  Service: Cardiovascular;  Laterality: Left;   LEFT HEART CATH AND CORONARY ANGIOGRAPHY N/A 11/11/2018   Procedure: LEFT HEART CATH AND CORONARY ANGIOGRAPHY with possible pci and stent;  Surgeon: Yolonda Kida, MD;  Location: [facility];  Service: Cardiovascular;  Laterality: N/A;   stents      Family History  Problem Relation Age of Onset   CAD Mother    Hypertension Mother    Cancer Father    CAD Father     Social History   Socioeconomic History   Marital status: Married    Spouse name: Not on file   Number of children: Not on file    Years of education: Not on file   Highest education level: Not on file  Occupational History   Not on file  Tobacco Use   Smoking status: Every Day    Packs/day: 0.25    Years: 25.00    Total pack years: 6.25    Types: Cigarettes   Smokeless tobacco: Never  Substance and Sexual Activity   Alcohol use: Yes    Alcohol/week: 8.0 standard drinks of alcohol    Types: 8 Cans of beer per week   Drug use: No   Sexual activity: Not on file  Other Topics Concern   Not on file  Social History Narrative   Not on file   Social Determinants of Health   Financial Resource Strain: Not on file  Food Insecurity: Not on file  Transportation Needs: Not on file  Physical Activity: Not on file  Stress: Not on file  Social Connections: Not on file  Intimate Partner Violence: Not on file     Current Outpatient Medications:    amLODipine (NORVASC) 10 MG tablet, Take 1 tablet by mouth daily., Disp: , Rfl:    aspirin 81 MG tablet, Take 81 mg by mouth daily. , Disp: , Rfl:    clopidogrel (PLAVIX) 75 MG tablet, TAKE 1 TABLET DAILY., Disp: 30 tablet, Rfl: 0   metoprolol succinate (TOPROL-XL) 50 MG 24 hr tablet, Take 50 mg by mouth daily. Take with  or immediately following a meal., Disp: , Rfl:    potassium chloride (MICRO-K) 10 MEQ CR capsule, TAKE (1) CAPSULE DAILY., Disp: 30 capsule, Rfl: 0   pravastatin (PRAVACHOL) 10 MG tablet, TAKE 1 TABLET DAILY., Disp: 30 tablet, Rfl: 0   fluticasone (FLONASE) 50 MCG/ACT nasal spray, Place 1 spray into both nostrils daily as needed for allergies or rhinitis. (Patient not taking: Reported on 05/19/2022), Disp: , Rfl:    ketoconazole (NIZORAL) 2 % shampoo, APPLY TOPICALLY ONCE FOR 1 DOSE. LEAVE ON FOR 5 MINUTES, THEN RINSE (Patient not taking: Reported on 05/19/2022), Disp: 120 mL, Rfl: 2   loratadine (CLARITIN) 10 MG tablet, TAKE ONE TABLET BY MOUTH ONCE DAILY (Patient not taking: Reported on 05/19/2022), Disp: 30 tablet, Rfl: 6   losartan-hydrochlorothiazide  (HYZAAR) 100-25 MG tablet, TAKE ONE TABLET BY MOUTH ONCE DAILY (Patient not taking: Reported on 05/19/2022), Disp: 90 tablet, Rfl: 4   Magnesium Oxide 400 (240 MG) MG TABS, One tab once per day (Patient not taking: Reported on 05/19/2022), Disp: 30 tablet, Rfl: 0   potassium chloride (K-DUR,KLOR-CON) 10 MEQ tablet, Take 10 mEq by mouth daily. (Patient not taking: Reported on 05/19/2022), Disp: , Rfl:    SUMAtriptan (IMITREX) 50 MG tablet, Take 50 mg by mouth every 2 (two) hours as needed for migraine. May repeat in 2 hours if headache persists or recurs. (Patient not taking: Reported on 05/19/2022), Disp: , Rfl:    Allergies  Allergen Reactions   Lopid [Gemfibrozil]    Statins Other (See Comments)    Pain and cramps    ROS Review of Systems  Constitutional: Negative.   HENT: Negative.    Eyes: Negative.   Respiratory: Negative.    Cardiovascular: Negative.   Gastrointestinal: Negative.   Endocrine: Negative.   Genitourinary: Negative.   Musculoskeletal: Negative.   Skin: Negative.   Allergic/Immunologic: Negative.   Neurological: Negative.   Hematological: Negative.   Psychiatric/Behavioral: Negative.    All other systems reviewed and are negative.     Objective:    Physical Exam Vitals reviewed.  Constitutional:      Appearance: Normal appearance.  HENT:     Mouth/Throat:     Mouth: Mucous membranes are moist.  Eyes:     Pupils: Pupils are equal, round, and reactive to light.  Neck:     Vascular: No carotid bruit.  Cardiovascular:     Rate and Rhythm: Normal rate and regular rhythm.     Pulses: Normal pulses.     Heart sounds: Normal heart sounds.  Pulmonary:     Effort: Pulmonary effort is normal.     Breath sounds: Normal breath sounds.  Abdominal:     General: Bowel sounds are normal.     Palpations: Abdomen is soft. There is no hepatomegaly, splenomegaly or mass.     Tenderness: There is no abdominal tenderness.     Hernia: No hernia is present.   Musculoskeletal:     Cervical back: Neck supple.     Right lower leg: No edema.     Left lower leg: No edema.  Skin:    Findings: No rash.  Neurological:     Mental Status: He is alert and oriented to person, place, and time.     Motor: No weakness.  Psychiatric:        Mood and Affect: Mood normal.        Behavior: Behavior normal.     There were no vitals taken for this visit. Wt Readings  from Last 3 Encounters:  11/08/20 175 lb (79.4 kg)  11/10/18 170 lb (77.1 kg)  02/24/17 168 lb (76.2 kg)     There are no preventive care reminders to display for this patient.  There are no preventive care reminders to display for this patient.  No results found for: "TSH" Lab Results  Component Value Date   WBC 8.0 11/11/2018   HGB 16.2 11/11/2018   HCT 45.0 11/11/2018   MCV 91.1 11/11/2018   PLT 153 11/11/2018   Lab Results  Component Value Date   NA 142 11/08/2020   K 3.5 11/08/2020   CO2 26 11/08/2020   GLUCOSE 100 (H) 11/08/2020   BUN 10 11/08/2020   CREATININE 0.79 11/08/2020   BILITOT 1.1 11/08/2020   ALKPHOS 70 11/10/2018   AST 43 (H) 11/08/2020   ALT 62 (H) 11/08/2020   PROT 6.9 11/08/2020   ALBUMIN 4.4 11/10/2018   CALCIUM 10.0 11/08/2020   ANIONGAP 7 11/11/2018   Lab Results  Component Value Date   CHOL 204 (H) 11/08/2020   Lab Results  Component Value Date   HDL 36 (L) 11/08/2020   Lab Results  Component Value Date   [facility]  11/08/2020     Comment:     . LDL cholesterol not calculated. Triglyceride levels greater than 400 mg/dL invalidate calculated LDL results. . Reference range: <100 . Desirable range <100 mg/dL for primary prevention;   <70 mg/dL for patients with CHD or diabetic patients  with > or = 2 CHD risk factors. Marland Kitchen LDL-C is now calculated using the Martin-Hopkins  calculation, which is a validated novel method providing  better accuracy than the Friedewald equation in the  estimation of LDL-C.  Cresenciano Genre et al. Annamaria Helling.  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 pectoris ([place])    Hypercholesterolemia  I advised the patient to follow Mediterranean diet This diet is rich in fruits vegetables and whole grain, and This diet is also rich in fish and lean meat Patient should also eat a handful of almonds or walnuts daily Recent heart study indicated that average follow-up on this kind of diet reduces the cardiovascular mortality by 50 to 70%==      Relevant Medications   amLODipine (NORVASC) 10 MG tablet   Primary hypertension     Patient denies any chest pain or shortness of breath there is no history of palpitation or paroxysmal nocturnal dyspnea   patient was advised to follow low-salt low-cholesterol diet    ideally I want to keep systolic blood pressure below 130 mmHg, patient was asked to check blood pressure one times a week and give me a report on that.  Patient will be follow-up in 3 months  or earlier as needed, patient will call me back for any change in the cardiovascular symptoms Patient was advised to buy a book from local bookstore concerning blood pressure and read several chapters  every day.  This will be supplemented by some of the material we will give him from the office.  Patient should also utilize other resources like YouTube and  Internet to learn more about the blood pressure and the diet.      Relevant Medications   amLODipine (NORVASC) 10 MG tablet     Other   Anxiety    - Patient experiencing high levels of anxiety.  - Encouraged patient to engage in relaxing activities like yoga, meditation,  journaling, going for a walk, or participating in a hobby.  - Encouraged patient to reach out to trusted friends or family members about recent struggles, Patient was advised to read A book, how to stop worrying and start living, it is good book to read to control  the stress       Bilateral carotid bruits - Primary    Stable at the present time      Moderate mixed hyperlipidemia not requiring statin therapy    Hypercholesterolemia  I advised the patient to follow Mediterranean diet This diet is rich in fruits vegetables and whole grain, and This diet is also rich in fish and lean meat Patient should also eat a handful of almonds or walnuts daily Recent heart study indicated that average follow-up on this kind of diet reduces the cardiovascular mortality by 50 to 70%==      Relevant Medications   amLODipine (NORVASC) 10 MG tablet    No orders of the defined types were placed in this encounter.   Follow-up: No follow-ups on file.    Cletis Athens, MD

## 2022-05-19 NOTE — Assessment & Plan Note (Signed)

## 2022-05-19 NOTE — Addendum Note (Signed)
Addended by: Patrcia Dolly on: 05/19/2022 04:32 PM   Modules accepted: Orders

## 2022-05-27 ENCOUNTER — Ambulatory Visit (INDEPENDENT_AMBULATORY_CARE_PROVIDER_SITE_OTHER): Payer: No Typology Code available for payment source | Admitting: *Deleted

## 2022-05-27 DIAGNOSIS — I10 Essential (primary) hypertension: Secondary | ICD-10-CM | POA: Diagnosis not present

## 2022-05-27 DIAGNOSIS — I209 Angina pectoris, unspecified: Secondary | ICD-10-CM

## 2022-05-27 DIAGNOSIS — Z0000 Encounter for general adult medical examination without abnormal findings: Secondary | ICD-10-CM

## 2022-05-27 DIAGNOSIS — E782 Mixed hyperlipidemia: Secondary | ICD-10-CM

## 2022-05-28 LAB — COMPLETE METABOLIC PANEL WITH GFR
AG Ratio: 1.7 (calc) (ref 1.0–2.5)
ALT: 56 U/L — ABNORMAL HIGH (ref 9–46)
AST: 32 U/L (ref 10–35)
Albumin: 4.2 g/dL (ref 3.6–5.1)
Alkaline phosphatase (APISO): 91 U/L (ref 35–144)
BUN: 7 mg/dL (ref 7–25)
CO2: 21 mmol/L (ref 20–32)
Calcium: 9.2 mg/dL (ref 8.6–10.3)
Chloride: 105 mmol/L (ref 98–110)
Creat: 0.87 mg/dL (ref 0.70–1.30)
Globulin: 2.5 g/dL (calc) (ref 1.9–3.7)
Glucose, Bld: 79 mg/dL (ref 65–99)
Potassium: 3.7 mmol/L (ref 3.5–5.3)
Sodium: 141 mmol/L (ref 135–146)
Total Bilirubin: 2.2 mg/dL — ABNORMAL HIGH (ref 0.2–1.2)
Total Protein: 6.7 g/dL (ref 6.1–8.1)
eGFR: 100 mL/min/{1.73_m2} (ref >=60)

## 2022-05-28 LAB — LIPID PANEL
Cholesterol: 183 mg/dL (ref <200)
HDL: 43 mg/dL (ref >=40)
LDL Cholesterol (Calc): 113 mg/dL (calc) — ABNORMAL HIGH
Non-HDL Cholesterol (Calc): 140 mg/dL (calc) — ABNORMAL HIGH (ref <130)
Total CHOL/HDL Ratio: 4.3 (calc) (ref <5.0)
Triglycerides: 155 mg/dL — ABNORMAL HIGH (ref <150)

## 2022-05-28 LAB — CBC WITH DIFFERENTIAL/PLATELET
Absolute Monocytes: 672 cells/uL (ref 200–950)
Basophils Absolute: 32 cells/uL (ref 0–200)
Basophils Relative: 0.4 %
Eosinophils Absolute: 40 cells/uL (ref 15–500)
Eosinophils Relative: 0.5 %
HCT: 48.4 % (ref 38.5–50.0)
Hemoglobin: 16.7 g/dL (ref 13.2–17.1)
Lymphs Abs: 1833 cells/uL (ref 850–3900)
MCH: 32.3 pg (ref 27.0–33.0)
MCHC: 34.5 g/dL (ref 32.0–36.0)
MCV: 93.6 fL (ref 80.0–100.0)
MPV: 10.8 fL (ref 7.5–12.5)
Monocytes Relative: 8.5 %
Neutro Abs: 5325 cells/uL (ref 1500–7800)
Neutrophils Relative %: 67.4 %
Platelets: 183 10*3/uL (ref 140–400)
RBC: 5.17 10*6/uL (ref 4.20–5.80)
RDW: 12.4 % (ref 11.0–15.0)
Total Lymphocyte: 23.2 %
WBC: 7.9 10*3/uL (ref 3.8–10.8)

## 2022-05-28 LAB — PSA: PSA: 0.54 ng/mL (ref <=4.00)

## 2022-05-28 LAB — TSH: TSH: 0.8 mIU/L (ref 0.40–4.50)

## 2022-06-10 ENCOUNTER — Encounter: Payer: Self-pay | Admitting: Internal Medicine

## 2022-06-10 ENCOUNTER — Ambulatory Visit: Payer: No Typology Code available for payment source | Admitting: Internal Medicine

## 2022-06-10 VITALS — BP 138/75 | HR 61 | Ht 70.0 in | Wt 145.2 lb

## 2022-06-10 DIAGNOSIS — I10 Essential (primary) hypertension: Secondary | ICD-10-CM

## 2022-06-10 DIAGNOSIS — F419 Anxiety disorder, unspecified: Secondary | ICD-10-CM

## 2022-06-10 DIAGNOSIS — R634 Abnormal weight loss: Secondary | ICD-10-CM | POA: Diagnosis not present

## 2022-06-10 DIAGNOSIS — E782 Mixed hyperlipidemia: Secondary | ICD-10-CM

## 2022-06-10 DIAGNOSIS — I25118 Atherosclerotic heart disease of native coronary artery with other forms of angina pectoris: Secondary | ICD-10-CM

## 2022-06-10 MED ORDER — LORATADINE 10 MG PO TABS: 10.0000 mg | ORAL_TABLET | Freq: Every day | ORAL | 1 refills | Status: AC

## 2022-06-10 NOTE — Assessment & Plan Note (Signed)

## 2022-06-10 NOTE — Assessment & Plan Note (Signed)
Stable at the present time. 

## 2022-06-10 NOTE — Progress Notes (Signed)
Established Patient Office Visit  Subjective:  Patient ID: [name], male    DOB: Jan 26, 1966  Age: 56 y.o. MRN: [id number]  CC:  Chief Complaint  Patient presents with   Follow-up    HPI  [name] presents for check up  Past Medical History:  Diagnosis Date   Anxiety    CAD (coronary artery disease)    HLD (hyperlipidemia)    Hypertension    Thrombocytopenia ([place])     Past Surgical History:  Procedure Laterality Date   COLONOSCOPY WITH PROPOFOL N/A 12/12/2015   Procedure: COLONOSCOPY WITH PROPOFOL;  Surgeon: Manya Silvas, MD;  Location: [facility];  Service: Endoscopy;  Laterality: N/A;   CORONARY STENT INTERVENTION N/A 02/24/2017   Procedure: Coronary Stent Intervention;  Surgeon: Isaias Cowman, MD;  Location: [facility];  Service: Cardiovascular;  Laterality: N/A;   CORONARY STENT INTERVENTION N/A 11/11/2018   Procedure: CORONARY STENT INTERVENTION;  Surgeon: Yolonda Kida, MD;  Location: [facility];  Service: Cardiovascular;  Laterality: N/A;   LEFT HEART CATH AND CORONARY ANGIOGRAPHY Left 02/24/2017   Procedure: Left Heart Cath and Coronary Angiography;  Surgeon: Isaias Cowman, MD;  Location: [facility];  Service: Cardiovascular;  Laterality: Left;   LEFT HEART CATH AND CORONARY ANGIOGRAPHY N/A 11/11/2018   Procedure: LEFT HEART CATH AND CORONARY ANGIOGRAPHY with possible pci and stent;  Surgeon: Yolonda Kida, MD;  Location: [facility];  Service: Cardiovascular;  Laterality: N/A;   stents      Family History  Problem Relation Age of Onset   CAD Mother    Hypertension Mother    Cancer Father    CAD Father     Social History   Socioeconomic History   Marital status: Married    Spouse name: Not on file   Number of children: Not on file   Years of education: Not on file   Highest education level: Not on file  Occupational History   Not on file  Tobacco Use   Smoking status: Every  Day    Packs/day: 0.25    Years: 25.00    Total pack years: 6.25    Types: Cigarettes   Smokeless tobacco: Never  Substance and Sexual Activity   Alcohol use: Yes    Alcohol/week: 8.0 standard drinks of alcohol    Types: 8 Cans of beer per week   Drug use: No   Sexual activity: Not on file  Other Topics Concern   Not on file  Social History Narrative   Not on file   Social Determinants of Health   Financial Resource Strain: Not on file  Food Insecurity: Not on file  Transportation Needs: Not on file  Physical Activity: Not on file  Stress: Not on file  Social Connections: Not on file  Intimate Partner Violence: Not on file     Current Outpatient Medications:    amLODipine (NORVASC) 10 MG tablet, Take 1 tablet by mouth daily., Disp: , Rfl:    aspirin 81 MG tablet, Take 81 mg by mouth daily. , Disp: , Rfl:    clopidogrel (PLAVIX) 75 MG tablet, Take 1 tablet (75 mg total) by mouth daily., Disp: 90 tablet, Rfl: 1   loratadine (CLARITIN) 10 MG tablet, Take 1 tablet (10 mg total) by mouth daily., Disp: 90 tablet, Rfl: 1   metoprolol succinate (TOPROL-XL) 50 MG 24 hr tablet, Take 50 mg by mouth daily. Take with or immediately following  a meal., Disp: , Rfl:    potassium chloride (MICRO-K) 10 MEQ CR capsule, TAKE (1) CAPSULE DAILY., Disp: 30 capsule, Rfl: 0   pravastatin (PRAVACHOL) 10 MG tablet, TAKE 1 TABLET DAILY., Disp: 30 tablet, Rfl: 0   Allergies  Allergen Reactions   Lopid [Gemfibrozil]    Statins Other (See Comments)    Pain and cramps    ROS Review of Systems  Constitutional: Negative.  Negative for fatigue.  HENT: Negative.    Eyes: Negative.   Respiratory: Negative.    Cardiovascular: Negative.   Gastrointestinal: Negative.  Negative for abdominal pain and blood in stool.       Loss of appetite , elevated  bilirubin ,elevated liver enzymes  Endocrine: Negative.   Genitourinary: Negative.   Musculoskeletal: Negative.   Skin: Negative.    Allergic/Immunologic: Negative.   Neurological: Negative.   Hematological: Negative.   Psychiatric/Behavioral: Negative.    All other systems reviewed and are negative.     Objective:    Physical Exam Vitals reviewed.  Constitutional:      Appearance: Normal appearance.  HENT:     Mouth/Throat:     Mouth: Mucous membranes are moist.  Eyes:     Pupils: Pupils are equal, round, and reactive to light.  Neck:     Vascular: No carotid bruit.  Cardiovascular:     Rate and Rhythm: Normal rate and regular rhythm.     Pulses: Normal pulses.     Heart sounds: Normal heart sounds.  Pulmonary:     Effort: Pulmonary effort is normal.     Breath sounds: Normal breath sounds.  Abdominal:     General: Bowel sounds are normal.     Palpations: Abdomen is soft. There is no hepatomegaly, splenomegaly or mass.     Tenderness: There is no abdominal tenderness.     Hernia: No hernia is present.  Musculoskeletal:     Cervical back: Neck supple.     Right lower leg: No edema.     Left lower leg: No edema.  Skin:    Findings: No rash.  Neurological:     Mental Status: He is alert and oriented to person, place, and time.     Motor: No weakness.  Psychiatric:        Mood and Affect: Mood normal.        Behavior: Behavior normal.     BP 138/75   Pulse 61   Ht 5\' 10"  (1.778 m)   Wt 145 lb 3.2 oz (65.9 kg)   BMI 20.83 kg/m  Wt Readings from Last 3 Encounters:  06/10/22 145 lb 3.2 oz (65.9 kg)  11/08/20 175 lb (79.4 kg)  11/10/18 170 lb (77.1 kg)     Health Maintenance Due  Topic Date Due   COVID-19 Vaccine (1) Never done    There are no preventive care reminders to display for this patient.  Lab Results  Component Value Date   TSH 0.80 05/27/2022   Lab Results  Component Value Date   WBC 7.9 05/27/2022   HGB 16.7 05/27/2022   HCT 48.4 05/27/2022   MCV 93.6 05/27/2022   PLT 183 05/27/2022   Lab Results  Component Value Date   NA 141 05/27/2022   K 3.7 05/27/2022    CO2 21 05/27/2022   GLUCOSE 79 05/27/2022   BUN 7 05/27/2022   CREATININE 0.87 05/27/2022   BILITOT 2.2 (H) 05/27/2022   ALKPHOS 70 11/10/2018   AST 32 05/27/2022  ALT 56 (H) 05/27/2022   PROT 6.7 05/27/2022   ALBUMIN 4.4 11/10/2018   CALCIUM 9.2 05/27/2022   ANIONGAP 7 11/11/2018   EGFR 100 05/27/2022   Lab Results  Component Value Date   CHOL 183 05/27/2022   Lab Results  Component Value Date   HDL 43 05/27/2022   Lab Results  Component Value Date   LDLCALC 113 (H) 05/27/2022   Lab Results  Component Value Date   TRIG 155 (H) 05/27/2022   Lab Results  Component Value Date   CHOLHDL 4.3 05/27/2022   No results found for: "HGBA1C"    Assessment & Plan:   Problem List Items Addressed This Visit       Cardiovascular and Mediastinum   Coronary artery disease of native artery of native heart with stable angina pectoris ([place]) - Primary    Stable at the present time      Primary hypertension     Patient denies any chest pain or shortness of breath there is no history of palpitation or paroxysmal nocturnal dyspnea   patient was advised to follow low-salt low-cholesterol diet    ideally I want to keep systolic blood pressure below 130 mmHg, patient was asked to check blood pressure one times a week and give me a report on that.  Patient will be follow-up in 3 months  or earlier as needed, patient will call me back for any change in the cardiovascular symptoms Patient was advised to buy a book from local bookstore concerning blood pressure and read several chapters  every day.  This will be supplemented by some of the material we will give him from the office.  Patient should also utilize other resources like YouTube and Internet to learn more about the blood pressure and the diet.        Other   Anxiety    - Patient experiencing high levels of anxiety.  - Encouraged patient to engage in relaxing activities like yoga, meditation, journaling, going for a walk, or  participating in a hobby.  - Encouraged patient to reach out to trusted friends or family members about recent struggles, Patient was advised to read A book, how to stop worrying and start living, it is good book to read to control  the stress       Moderate mixed hyperlipidemia not requiring statin therapy    Hypercholesterolemia  I advised the patient to follow Mediterranean diet This diet is rich in fruits vegetables and whole grain, and This diet is also rich in fish and lean meat Patient should also eat a handful of almonds or walnuts daily Recent heart study indicated that average follow-up on this kind of diet reduces the cardiovascular mortality by 50 to 70%==      Other Visit Diagnoses     Unexplained weight loss       Relevant Orders   Ambulatory referral to Gastroenterology       Meds ordered this encounter  Medications   loratadine (CLARITIN) 10 MG tablet    Sig: Take 1 tablet (10 mg total) by mouth daily.    Dispense:  90 tablet    Refill:  1    Follow-up: No follow-ups on file.    Cletis Athens, MD

## 2022-06-10 NOTE — Assessment & Plan Note (Signed)
Hypercholesterolemia  I advised the patient to follow Mediterranean diet This diet is rich in fruits vegetables and whole grain, and This diet is also rich in fish and lean meat Patient should also eat a handful of almonds or walnuts daily Recent heart study indicated that average follow-up on this kind of diet reduces the cardiovascular mortality by 50 to 70%== 

## 2022-06-10 NOTE — Assessment & Plan Note (Signed)
-   Patient experiencing high levels of anxiety.  - Encouraged patient to engage in relaxing activities like yoga, meditation, journaling, going for a walk, or participating in a hobby.  - Encouraged patient to reach out to trusted friends or family members about recent struggles, Patient was advised to read A book, how to stop worrying and start living, it is good book to read to control  the stress  

## 2022-06-13 ENCOUNTER — Other Ambulatory Visit: Payer: Self-pay | Admitting: Internal Medicine

## 2022-06-18 DIAGNOSIS — R630 Anorexia: Secondary | ICD-10-CM | POA: Insufficient documentation

## 2022-06-18 DIAGNOSIS — R634 Abnormal weight loss: Secondary | ICD-10-CM | POA: Insufficient documentation

## 2022-06-22 ENCOUNTER — Encounter: Payer: Self-pay | Admitting: *Deleted

## 2022-06-23 ENCOUNTER — Ambulatory Visit
Admission: RE | Admit: 2022-06-23 | Discharge: 2022-06-23 | Disposition: A | Discharge status: Home / Self Care | Payer: No Typology Code available for payment source | Source: Ambulatory Visit | Attending: Gastroenterology | Admitting: Gastroenterology

## 2022-06-23 ENCOUNTER — Encounter: Payer: Self-pay | Admitting: *Deleted

## 2022-06-23 ENCOUNTER — Encounter
Admission: RE | Disposition: A | Discharge status: Home / Self Care | Payer: Self-pay | Source: Ambulatory Visit | Attending: Gastroenterology

## 2022-06-23 ENCOUNTER — Ambulatory Visit: Payer: No Typology Code available for payment source | Admitting: Anesthesiology

## 2022-06-23 DIAGNOSIS — I2510 Atherosclerotic heart disease of native coronary artery without angina pectoris: Secondary | ICD-10-CM | POA: Insufficient documentation

## 2022-06-23 DIAGNOSIS — R194 Change in bowel habit: Secondary | ICD-10-CM | POA: Diagnosis not present

## 2022-06-23 DIAGNOSIS — E785 Hyperlipidemia, unspecified: Secondary | ICD-10-CM | POA: Insufficient documentation

## 2022-06-23 DIAGNOSIS — I10 Essential (primary) hypertension: Secondary | ICD-10-CM | POA: Insufficient documentation

## 2022-06-23 DIAGNOSIS — D123 Benign neoplasm of transverse colon: Secondary | ICD-10-CM | POA: Insufficient documentation

## 2022-06-23 DIAGNOSIS — R634 Abnormal weight loss: Secondary | ICD-10-CM | POA: Insufficient documentation

## 2022-06-23 DIAGNOSIS — Z87891 Personal history of nicotine dependence: Secondary | ICD-10-CM | POA: Insufficient documentation

## 2022-06-23 DIAGNOSIS — K6289 Other specified diseases of anus and rectum: Secondary | ICD-10-CM | POA: Diagnosis not present

## 2022-06-23 DIAGNOSIS — Z9049 Acquired absence of other specified parts of digestive tract: Secondary | ICD-10-CM | POA: Insufficient documentation

## 2022-06-23 DIAGNOSIS — Z7902 Long term (current) use of antithrombotics/antiplatelets: Secondary | ICD-10-CM | POA: Diagnosis not present

## 2022-06-23 DIAGNOSIS — R1013 Epigastric pain: Secondary | ICD-10-CM | POA: Diagnosis present

## 2022-06-23 DIAGNOSIS — K640 First degree hemorrhoids: Secondary | ICD-10-CM | POA: Diagnosis not present

## 2022-06-23 DIAGNOSIS — R1310 Dysphagia, unspecified: Secondary | ICD-10-CM | POA: Insufficient documentation

## 2022-06-23 DIAGNOSIS — Z6820 Body mass index (BMI) 20.0-20.9, adult: Secondary | ICD-10-CM | POA: Diagnosis not present

## 2022-06-23 DIAGNOSIS — D122 Benign neoplasm of ascending colon: Secondary | ICD-10-CM | POA: Insufficient documentation

## 2022-06-23 HISTORY — PX: COLONOSCOPY WITH PROPOFOL: SHX5780

## 2022-06-23 HISTORY — PX: ESOPHAGOGASTRODUODENOSCOPY (EGD) WITH PROPOFOL: SHX5813

## 2022-06-23 SURGERY — COLONOSCOPY WITH PROPOFOL
Anesthesia: General

## 2022-06-23 MED ORDER — SODIUM CHLORIDE 0.9 % IV SOLN: INTRAVENOUS | Status: DC

## 2022-06-23 MED ORDER — STERILE WATER FOR IRRIGATION IR SOLN
Status: DC | PRN
  Administered 2022-06-23 (×2): 60 mL

## 2022-06-23 MED ORDER — PROPOFOL 10 MG/ML IV BOLUS
INTRAVENOUS | Status: DC | PRN
  Administered 2022-06-23 (×2): 20 mg via INTRAVENOUS
  Administered 2022-06-23: 100 mg via INTRAVENOUS
  Administered 2022-06-23: 10 mg via INTRAVENOUS
  Administered 2022-06-23 (×2): 20 mg via INTRAVENOUS

## 2022-06-23 MED ORDER — DEXMEDETOMIDINE HCL IN NACL 200 MCG/50ML IV SOLN
INTRAVENOUS | Status: DC | PRN
  Administered 2022-06-23 (×2): 4 ug via INTRAVENOUS

## 2022-06-23 MED ORDER — PROPOFOL 500 MG/50ML IV EMUL
INTRAVENOUS | Status: DC | PRN
  Administered 2022-06-23: 150 ug/kg/min via INTRAVENOUS

## 2022-06-23 MED ORDER — PHENYLEPHRINE 80 MCG/ML (10ML) SYRINGE FOR IV PUSH (FOR BLOOD PRESSURE SUPPORT)
PREFILLED_SYRINGE | INTRAVENOUS | Status: DC | PRN
  Administered 2022-06-23 (×3): 80 ug via INTRAVENOUS

## 2022-06-23 MED ORDER — GLYCOPYRROLATE 0.2 MG/ML IJ SOLN
INTRAMUSCULAR | Status: DC | PRN
  Administered 2022-06-23: .2 mg via INTRAVENOUS

## 2022-06-23 MED ORDER — LIDOCAINE HCL (CARDIAC) PF 100 MG/5ML IV SOSY
PREFILLED_SYRINGE | INTRAVENOUS | Status: DC | PRN
  Administered 2022-06-23: 40 mg via INTRAVENOUS

## 2022-06-23 MED ORDER — LIDOCAINE HCL (PF) 2 % IJ SOLN
INTRAMUSCULAR | Status: AC
  Filled 2022-06-23: qty 5

## 2022-06-23 MED ORDER — PROPOFOL 1000 MG/100ML IV EMUL
INTRAVENOUS | Status: AC
  Filled 2022-06-23: qty 100

## 2022-06-23 NOTE — Anesthesia Postprocedure Evaluation (Signed)
Anesthesia Post Note  Patient: [name]  Procedure(s) Performed: COLONOSCOPY WITH PROPOFOL ESOPHAGOGASTRODUODENOSCOPY (EGD) WITH PROPOFOL  Patient location during evaluation: Endoscopy Anesthesia Type: General Level of consciousness: awake and alert Pain management: pain level controlled Vital Signs Assessment: post-procedure vital signs reviewed and stable Respiratory status: spontaneous breathing, nonlabored ventilation, respiratory function stable and patient connected to nasal cannula oxygen Cardiovascular status: blood pressure returned to baseline and stable Postop Assessment: no apparent nausea or vomiting Anesthetic complications: no   No notable events documented.   Last Vitals:  Vitals:   06/23/22 1000 06/23/22 1002  BP: 132/87 132/87  Pulse:    Resp: 12 19  Temp:    SpO2:      Last Pain:  Vitals:   06/23/22 0941  TempSrc: Temporal  PainSc:                  Precious Haws Arletha Marschke

## 2022-06-23 NOTE — Anesthesia Procedure Notes (Signed)
Procedure Name: MAC Date/Time: 06/23/2022 8:32 AM  Performed by: Tollie Eth, CRNAPre-anesthesia Checklist: Patient identified, Emergency Drugs available, Suction available and Patient being monitored Patient Re-evaluated:Patient Re-evaluated prior to induction Oxygen Delivery Method: Nasal cannula Induction Type: IV induction Placement Confirmation: positive ETCO2

## 2022-06-23 NOTE — H&P (Signed)
Outpatient short stay form Pre-procedure 06/23/2022  Lesly Rubenstein, MD  Primary Physician: Cletis Athens, MD  Reason for visit:  Dysphagia/Weight loss  History of present illness:    56 y/o gentleman with history of CAD, hypertension, and alcohol use here for EGD/Colonoscopy for 5 month history of solid food dysphagia and 35 pound weight loss. Last colonoscopy in 2017 with 2 < 1 cm polyps. Last dose of plavix was 5 days ago. History of appendectomy. He thinks his father had stomach cancer.    Current Facility-Administered Medications:    0.9 %  sodium chloride infusion, , Intravenous, Continuous, Synia Douglass, Hilton Cork, MD, Last Rate: 20 mL/hr at 06/23/22 6948, Continued from Pre-op at 06/23/22 0832  Medications Prior to Admission  Medication Sig Dispense Refill Last Dose   amLODipine (NORVASC) 10 MG tablet Take 1 tablet by mouth daily.   06/22/2022   metoprolol succinate (TOPROL-XL) 50 MG 24 hr tablet Take 50 mg by mouth daily. Take with or immediately following a meal.   06/22/2022   aspirin 81 MG tablet Take 81 mg by mouth daily.       clopidogrel (PLAVIX) 75 MG tablet Take 1 tablet (75 mg total) by mouth daily. 90 tablet 1 06/18/2022   loratadine (CLARITIN) 10 MG tablet Take 1 tablet (10 mg total) by mouth daily. 90 tablet 1    potassium chloride (MICRO-K) 10 MEQ CR capsule TAKE (1) CAPSULE DAILY. (Patient not taking: Reported on 06/23/2022) 30 capsule 0 Not Taking   pravastatin (PRAVACHOL) 10 MG tablet TAKE 1 TABLET DAILY. (Patient not taking: Reported on 06/23/2022) 30 tablet 0 Not Taking     Allergies  Allergen Reactions   Lopid [Gemfibrozil]    Statins Other (See Comments)    Pain and cramps     Past Medical History:  Diagnosis Date   Anxiety    CAD (coronary artery disease)    HLD (hyperlipidemia)    Hypertension    Thrombocytopenia ([place])     Review of systems:  Otherwise negative.    Physical Exam  Gen: Alert, oriented. Appears stated age.  HEENT:  PERRLA. Lungs: No respiratory distress CV: RRR Abd: soft, benign, no masses Ext: No edema    Planned procedures: Proceed with EGD/colonoscopy. The patient understands the nature of the planned procedure, indications, risks, alternatives and potential complications including but not limited to bleeding, infection, perforation, damage to internal organs and possible oversedation/side effects from anesthesia. The patient agrees and gives consent to proceed.  Please refer to procedure notes for findings, recommendations and patient disposition/instructions.     Lesly Rubenstein, MD [facility]

## 2022-06-23 NOTE — Anesthesia Preprocedure Evaluation (Signed)
Anesthesia Evaluation  Patient identified by MRN, date of birth, ID band Patient awake    Reviewed: Allergy & Precautions, NPO status , Patient's Chart, lab work & pertinent test results  History of Anesthesia Complications Negative for: history of anesthetic complications  Airway Mallampati: III  TM Distance: <3 FB Neck ROM: full    Dental  (+) Chipped   Pulmonary neg pulmonary ROS, neg shortness of breath, Patient abstained from smoking., former smoker,    Pulmonary exam normal        Cardiovascular Exercise Tolerance: Good hypertension, (-) angina+ CAD and + Cardiac Stents  Normal cardiovascular exam     Neuro/Psych PSYCHIATRIC DISORDERS negative neurological ROS     GI/Hepatic negative GI ROS, Neg liver ROS, neg GERD  ,  Endo/Other  negative endocrine ROS  Renal/GU negative Renal ROS  negative genitourinary   Musculoskeletal   Abdominal   Peds  Hematology negative hematology ROS (+)   Anesthesia Other Findings Past Medical History: No date: Anxiety No date: CAD (coronary artery disease) No date: HLD (hyperlipidemia) No date: Hypertension No date: Thrombocytopenia ([place])  Past Surgical History: 12/12/2015: COLONOSCOPY WITH PROPOFOL; N/A     Comment:  Procedure: COLONOSCOPY WITH PROPOFOL;  Surgeon: Manya Silvas, MD;  Location: [facility];  Service:               Endoscopy;  Laterality: N/A; 02/24/2017: CORONARY STENT INTERVENTION; N/A     Comment:  Procedure: Coronary Stent Intervention;  Surgeon:               Isaias Cowman, MD;  Location: [facility] [facility];  Service: Cardiovascular;  Laterality: N/A; 11/11/2018: CORONARY STENT INTERVENTION; N/A     Comment:  Procedure: CORONARY STENT INTERVENTION;  Surgeon:               Yolonda Kida, MD;  Location: [place] [facility];               Service: Cardiovascular;  Laterality: N/A; 02/24/2017: LEFT HEART CATH  AND CORONARY ANGIOGRAPHY; Left     Comment:  Procedure: Left Heart Cath and Coronary Angiography;                Surgeon: Isaias Cowman, MD;  Location: [facility];  Service: Cardiovascular;  Laterality:               Left; 11/11/2018: LEFT HEART CATH AND CORONARY ANGIOGRAPHY; N/A     Comment:  Procedure: LEFT HEART CATH AND CORONARY ANGIOGRAPHY with              possible pci and stent;  Surgeon: Yolonda Kida, MD;              Location: [facility];  Service: Cardiovascular;              Laterality: N/A; No date: stents     Reproductive/Obstetrics negative OB ROS                             Anesthesia Physical Anesthesia Plan  ASA: 3  Anesthesia Plan: General   Post-op Pain Management:    Induction: Intravenous  PONV Risk Score and Plan: Propofol  infusion and TIVA  Airway Management Planned: Natural Airway and Nasal Cannula  Additional Equipment:   Intra-op Plan:   Post-operative Plan:   Informed Consent: I have reviewed the patients History and Physical, chart, labs and discussed the procedure including the risks, benefits and alternatives for the proposed anesthesia with the patient or authorized representative who has indicated his/her understanding and acceptance.     Dental Advisory Given  Plan Discussed with: Anesthesiologist, CRNA and Surgeon  Anesthesia Plan Comments: (Patient consented for risks of anesthesia including but not limited to:  - adverse reactions to medications - risk of airway placement if required - damage to eyes, teeth, lips or other oral mucosa - nerve damage due to positioning  - sore throat or hoarseness - Damage to heart, brain, nerves, lungs, other parts of body or loss of life  Patient voiced understanding.)        Anesthesia Quick Evaluation

## 2022-06-23 NOTE — Interval H&P Note (Signed)
History and Physical Interval Note:  06/23/2022 8:46 AM  [name]  has presented today for surgery, with the diagnosis of Dysphagia weight loss change in bowel habits H/O TA Polyps.  The various methods of treatment have been discussed with the patient and family. After consideration of risks, benefits and other options for treatment, the patient has consented to  Procedure(s): COLONOSCOPY WITH PROPOFOL (N/A) ESOPHAGOGASTRODUODENOSCOPY (EGD) WITH PROPOFOL (N/A) as a surgical intervention.  The patient's history has been reviewed, patient examined, no change in status, stable for surgery.  I have reviewed the patient's chart and labs.  Questions were answered to the patient's satisfaction.     Lesly Rubenstein  Ok to proceed with EGD/Colonoscopy

## 2022-06-23 NOTE — Op Note (Signed)
[facility] Patient Name: [name] Procedure Date: 06/23/2022 8:33 AM MRN: [id number] Account #: [id number] Date of Birth: [birth year] Admit Type: Outpatient Age: 56 Room: [facility] ENDO ROOM 1 Gender: Male Note Status: Finalized Instrument Name: Jasper Riling [id number] Procedure:             Colonoscopy Indications:           Change in bowel habits, Weight loss Providers:             Andrey Farmer MD, MD Referring MD:          Cletis Athens, MD (Referring MD) Medicines:             Monitored Anesthesia Care Complications:         No immediate complications. Estimated blood loss:                         Minimal. Procedure:             Pre-Anesthesia Assessment:                        - Prior to the procedure, a History and Physical was                         performed, and patient medications and allergies were                         reviewed. The patient is competent. The risks and                         benefits of the procedure and the sedation options and                         risks were discussed with the patient. All questions                         were answered and informed consent was obtained.                         Patient identification and proposed procedure were                         verified by the physician, the nurse, the                         anesthesiologist, the anesthetist and the technician                         in the endoscopy suite. Mental Status Examination:                         alert and oriented. Airway Examination: normal                         oropharyngeal airway and neck mobility. Respiratory                         Examination: clear to auscultation. CV Examination:  normal. Prophylactic Antibiotics: The patient does not                         require prophylactic antibiotics. Prior                         Anticoagulants: The patient has taken Plavix                          (clopidogrel), last dose was 5 days prior to                         procedure. ASA Grade Assessment: III - A patient with                         severe systemic disease. After reviewing the risks and                         benefits, the patient was deemed in satisfactory                         condition to undergo the procedure. The anesthesia                         plan was to use monitored anesthesia care (MAC).                         Immediately prior to administration of medications,                         the patient was re-assessed for adequacy to receive                         sedatives. The heart rate, respiratory rate, oxygen                         saturations, blood pressure, adequacy of pulmonary                         ventilation, and response to care were monitored                         throughout the procedure. The physical status of the                         patient was re-assessed after the procedure.                        After obtaining informed consent, the colonoscope was                         passed under direct vision. Throughout the procedure,                         the patient's blood pressure, pulse, and oxygen                         saturations were monitored continuously. The  Colonoscope was introduced through the anus and                         advanced to the 1 cm into the ileum. The colonoscopy                         was performed without difficulty. The patient                         tolerated the procedure well. The quality of the bowel                         preparation was good. Findings:      The perianal and digital rectal examinations were normal.      The terminal ileum appeared normal. Although could only drive a short       distance into the terminal ileum      A 11 mm polyp was found in the ascending colon. The polyp was sessile.       The polyp was removed with a hot snare. Resection and retrieval were        complete. The edges of the polypectomy site were coaged with the tip of       the snare. To prevent bleeding post-intervention, two hemostatic clips       were successfully placed. There was no bleeding during, or at the end,       of the procedure.      A 7 mm polyp was found in the proximal transverse colon. The polyp was       flat. The polyp was removed with a saline injection-lift technique using       a hot snare. Resection and retrieval were complete. Estimated blood       loss: none.      Anal papilla(e) were hypertrophied. Biopsies were taken with a cold       forceps for histology. Estimated blood loss was minimal.      Internal hemorrhoids were found during retroflexion. The hemorrhoids       were Grade I (internal hemorrhoids that do not prolapse).      The exam was otherwise without abnormality on direct and retroflexion       views. Impression:            - The examined portion of the ileum was normal.                        - One 11 mm polyp in the ascending colon, removed with                         a hot snare. Resected and retrieved. Clips were placed.                        - One 7 mm polyp in the proximal transverse colon,                         removed using injection-lift and a hot snare. Resected                         and retrieved.                        -  Anal papilla(e) were hypertrophied. Biopsied.                        - Internal hemorrhoids.                        - The examination was otherwise normal on direct and                         retroflexion views. Recommendation:        - Discharge patient to home.                        - Resume previous diet.                        - Resume Plavix (clopidogrel) at prior dose in 2 days.                        - Await pathology results.                        - Repeat colonoscopy for surveillance based on                         pathology results.                        - Return to referring physician  as previously                         scheduled. Procedure Code(s):     --- Professional ---                        [phone], Colonoscopy, flexible; with removal of                         tumor(s), polyp(s), or other lesion(s) by snare                         technique                        45381, Colonoscopy, flexible; with directed submucosal                         injection(s), any substance                        30160, 30, Colonoscopy, flexible; with biopsy, single                         or multiple Diagnosis Code(s):     --- Professional ---                        K64.0, First degree hemorrhoids                        K63.5, Polyp of colon                        K62.89, Other specified diseases of anus and  rectum                        R19.4, Change in bowel habit                        R63.4, Abnormal weight loss CPT copyright 2019 American Medical Association. All rights reserved. The codes documented in this report are preliminary and upon coder review may  be revised to meet current compliance requirements. Andrey Farmer MD, MD 06/23/2022 9:46:20 AM Number of Addenda: 0 Note Initiated On: 06/23/2022 8:33 AM Scope Withdrawal Time: 0 hours 28 minutes 5 seconds  Total Procedure Duration: 0 hours 31 minutes 46 seconds  Estimated Blood Loss:  Estimated blood loss was minimal.      [facility]

## 2022-06-23 NOTE — Transfer of Care (Signed)
  Immediate Anesthesia Transfer of Care Note  Patient: [name]  Procedure(s) Performed: Procedure(s): COLONOSCOPY WITH PROPOFOL (N/A) ESOPHAGOGASTRODUODENOSCOPY (EGD) WITH PROPOFOL (N/A)  Patient Location: PACU and Endoscopy Unit  Anesthesia Type:General  Level of Consciousness: sedated  Airway & Oxygen Therapy: Patient Spontanous Breathing and Patient connected to nasal cannula oxygen  Post-op Assessment: Report given to RN and Post -op Vital signs reviewed and stable  Post vital signs: Reviewed and stable  Last Vitals:  Vitals:   06/23/22 0803 06/23/22 0941  BP: (!) 198/90 117/84  Pulse: 77 79  Resp: 18 13  Temp: (!) 35.9 C   SpO2: 722% 57%    Complications: No apparent anesthesia complications

## 2022-06-23 NOTE — Op Note (Signed)
[facility] Patient Name: [name] Procedure Date: 06/23/2022 8:33 AM MRN: [id number] Account #: [id number] Date of Birth: 1965-10-29 Admit Type: Outpatient Age: 56 Room: [facility] ENDO ROOM 1 Gender: Male Note Status: Finalized Instrument Name: Michaelle Birks [id number] Procedure:             Upper GI endoscopy Indications:           Dyspepsia, Dysphagia Providers:             Andrey Farmer MD, MD Referring MD:          Cletis Athens, MD (Referring MD) Medicines:             Monitored Anesthesia Care Complications:         No immediate complications. Estimated blood loss:                         Minimal. Procedure:             Pre-Anesthesia Assessment:                        - Prior to the procedure, a History and Physical was                         performed, and patient medications and allergies were                         reviewed. The patient is competent. The risks and                         benefits of the procedure and the sedation options and                         risks were discussed with the patient. All questions                         were answered and informed consent was obtained.                         Patient identification and proposed procedure were                         verified by the physician, the nurse, the                         anesthesiologist, the anesthetist and the technician                         in the endoscopy suite. Mental Status Examination:                         alert and oriented. Airway Examination: normal                         oropharyngeal airway and neck mobility. Respiratory                         Examination: clear to auscultation. CV Examination:  normal. Prophylactic Antibiotics: The patient does not                         require prophylactic antibiotics. Prior                         Anticoagulants: The patient has taken Plavix                          (clopidogrel), last dose was 5 days prior to                         procedure. ASA Grade Assessment: III - A patient with                         severe systemic disease. After reviewing the risks and                         benefits, the patient was deemed in satisfactory                         condition to undergo the procedure. The anesthesia                         plan was to use monitored anesthesia care (MAC).                         Immediately prior to administration of medications,                         the patient was re-assessed for adequacy to receive                         sedatives. The heart rate, respiratory rate, oxygen                         saturations, blood pressure, adequacy of pulmonary                         ventilation, and response to care were monitored                         throughout the procedure. The physical status of the                         patient was re-assessed after the procedure.                        After obtaining informed consent, the endoscope was                         passed under direct vision. Throughout the procedure,                         the patient's blood pressure, pulse, and oxygen                         saturations were monitored continuously. The Endoscope  was introduced through the mouth, and advanced to the                         second part of duodenum. The upper GI endoscopy was                         accomplished without difficulty. The patient tolerated                         the procedure well. Findings:      The examined esophagus was normal. Biopsies were taken with a cold       forceps for histology at the GE junction. Estimated blood loss was       minimal.      The entire examined stomach was normal. Biopsies were taken with a cold       forceps for Helicobacter pylori testing. Estimated blood loss was       minimal.      The examined duodenum was normal. Impression:             - Normal esophagus. Biopsied.                        - Normal stomach. Biopsied.                        - Normal examined duodenum. Recommendation:        - Discharge patient to home.                        - Resume previous diet.                        - Continue present medications.                        - Await pathology results.                        - Return to referring physician as previously                         scheduled. Procedure Code(s):     --- Professional ---                        [phone], Esophagogastroduodenoscopy, flexible,                         transoral; with biopsy, single or multiple Diagnosis Code(s):     --- Professional ---                        R10.13, Epigastric pain                        R13.10, Dysphagia, unspecified CPT copyright 2019 American Medical Association. All rights reserved. The codes documented in this report are preliminary and upon coder review may  be revised to meet current compliance requirements. Andrey Farmer MD, MD 06/23/2022 9:40:46 AM Number of Addenda: 0 Note Initiated On: 06/23/2022 8:33 AM Estimated Blood Loss:  Estimated blood loss was minimal.      [facility]

## 2022-06-24 ENCOUNTER — Other Ambulatory Visit: Payer: Self-pay | Admitting: Gastroenterology

## 2022-06-24 ENCOUNTER — Encounter: Payer: Self-pay | Admitting: Gastroenterology

## 2022-06-24 DIAGNOSIS — R634 Abnormal weight loss: Secondary | ICD-10-CM

## 2022-06-24 DIAGNOSIS — R1319 Other dysphagia: Secondary | ICD-10-CM

## 2022-06-24 DIAGNOSIS — R630 Anorexia: Secondary | ICD-10-CM

## 2022-06-24 DIAGNOSIS — R053 Chronic cough: Secondary | ICD-10-CM

## 2022-06-24 DIAGNOSIS — R198 Other specified symptoms and signs involving the digestive system and abdomen: Secondary | ICD-10-CM

## 2022-06-24 LAB — SURGICAL PATHOLOGY

## 2022-06-29 ENCOUNTER — Ambulatory Visit
Admission: RE | Admit: 2022-06-29 | Discharge: 2022-06-29 | Disposition: A | Discharge status: Home / Self Care | Payer: No Typology Code available for payment source | Source: Ambulatory Visit | Attending: Gastroenterology | Admitting: Gastroenterology

## 2022-06-29 DIAGNOSIS — R053 Chronic cough: Secondary | ICD-10-CM | POA: Diagnosis present

## 2022-06-29 DIAGNOSIS — R1319 Other dysphagia: Secondary | ICD-10-CM | POA: Diagnosis not present

## 2022-06-29 DIAGNOSIS — R634 Abnormal weight loss: Secondary | ICD-10-CM | POA: Diagnosis present

## 2022-06-29 DIAGNOSIS — R630 Anorexia: Secondary | ICD-10-CM

## 2022-06-29 DIAGNOSIS — R198 Other specified symptoms and signs involving the digestive system and abdomen: Secondary | ICD-10-CM

## 2022-06-29 MED ORDER — IOHEXOL 300 MG/ML  SOLN
85.0000 mL | Freq: Once | INTRAMUSCULAR | Status: AC | PRN
  Administered 2022-06-29: 85 mL via INTRAVENOUS

## 2022-06-30 ENCOUNTER — Other Ambulatory Visit: Payer: Self-pay | Admitting: Gastroenterology

## 2022-06-30 DIAGNOSIS — R935 Abnormal findings on diagnostic imaging of other abdominal regions, including retroperitoneum: Secondary | ICD-10-CM

## 2022-06-30 DIAGNOSIS — K8689 Other specified diseases of pancreas: Secondary | ICD-10-CM

## 2022-07-03 ENCOUNTER — Other Ambulatory Visit: Payer: Self-pay | Admitting: Gastroenterology

## 2022-07-03 ENCOUNTER — Ambulatory Visit
Admission: RE | Admit: 2022-07-03 | Discharge: 2022-07-03 | Disposition: A | Discharge status: Home / Self Care | Payer: No Typology Code available for payment source | Source: Ambulatory Visit | Attending: Gastroenterology | Admitting: Gastroenterology

## 2022-07-03 DIAGNOSIS — K8689 Other specified diseases of pancreas: Secondary | ICD-10-CM | POA: Insufficient documentation

## 2022-07-03 DIAGNOSIS — R935 Abnormal findings on diagnostic imaging of other abdominal regions, including retroperitoneum: Secondary | ICD-10-CM | POA: Insufficient documentation

## 2022-07-03 MED ORDER — GADOBUTROL 1 MMOL/ML IV SOLN
6.0000 mL | Freq: Once | INTRAVENOUS | Status: AC | PRN
  Administered 2022-07-03: 6 mL via INTRAVENOUS

## 2022-07-20 ENCOUNTER — Other Ambulatory Visit: Payer: Self-pay | Admitting: Internal Medicine

## 2022-07-28 ENCOUNTER — Other Ambulatory Visit: Payer: Self-pay | Admitting: Family Medicine

## 2022-07-28 DIAGNOSIS — Z87891 Personal history of nicotine dependence: Secondary | ICD-10-CM

## 2022-07-28 DIAGNOSIS — R634 Abnormal weight loss: Secondary | ICD-10-CM

## 2022-07-29 ENCOUNTER — Ambulatory Visit
Admission: RE | Admit: 2022-07-29 | Discharge: 2022-07-29 | Disposition: A | Discharge status: Home / Self Care | Payer: No Typology Code available for payment source | Source: Ambulatory Visit | Attending: Family Medicine | Admitting: Family Medicine

## 2022-07-29 DIAGNOSIS — R634 Abnormal weight loss: Secondary | ICD-10-CM

## 2022-07-29 DIAGNOSIS — Z87891 Personal history of nicotine dependence: Secondary | ICD-10-CM

## 2022-08-17 ENCOUNTER — Other Ambulatory Visit: Payer: Self-pay | Admitting: Internal Medicine

## 2022-09-07 ENCOUNTER — Ambulatory Visit: Payer: No Typology Code available for payment source | Admitting: Internal Medicine

## 2022-10-19 ENCOUNTER — Telehealth: Payer: Self-pay

## 2022-10-19 ENCOUNTER — Other Ambulatory Visit: Payer: Self-pay

## 2022-10-19 ENCOUNTER — Ambulatory Visit: Payer: No Typology Code available for payment source | Admitting: Gastroenterology

## 2022-10-19 NOTE — Telephone Encounter (Signed)
Called patient to let him know that he is a current [facility] patient and that we are not allowed to see him. Therefore, I let him know via voicemail that his appointment with [place] had to be cancelled.

## 2023-02-09 ENCOUNTER — Other Ambulatory Visit (INDEPENDENT_AMBULATORY_CARE_PROVIDER_SITE_OTHER): Payer: Self-pay | Admitting: Nurse Practitioner

## 2023-02-09 DIAGNOSIS — I739 Peripheral vascular disease, unspecified: Secondary | ICD-10-CM

## 2023-02-12 ENCOUNTER — Ambulatory Visit (INDEPENDENT_AMBULATORY_CARE_PROVIDER_SITE_OTHER): Payer: PRIVATE HEALTH INSURANCE

## 2023-02-12 ENCOUNTER — Ambulatory Visit (INDEPENDENT_AMBULATORY_CARE_PROVIDER_SITE_OTHER): Payer: PRIVATE HEALTH INSURANCE | Admitting: Nurse Practitioner

## 2023-02-12 VITALS — BP 144/83 | HR 72 | Resp 17 | Ht 68.0 in | Wt 150.6 lb

## 2023-02-12 DIAGNOSIS — E782 Mixed hyperlipidemia: Secondary | ICD-10-CM | POA: Diagnosis not present

## 2023-02-12 DIAGNOSIS — I739 Peripheral vascular disease, unspecified: Secondary | ICD-10-CM

## 2023-02-12 DIAGNOSIS — M7989 Other specified soft tissue disorders: Secondary | ICD-10-CM | POA: Diagnosis not present

## 2023-02-12 DIAGNOSIS — I10 Essential (primary) hypertension: Secondary | ICD-10-CM

## 2023-02-15 ENCOUNTER — Encounter (INDEPENDENT_AMBULATORY_CARE_PROVIDER_SITE_OTHER): Payer: Self-pay | Admitting: Nurse Practitioner

## 2023-02-15 NOTE — Progress Notes (Signed)
Subjective:    Patient ID: [name], male    DOB: 20-May-1966, 57 y.o.   MRN: [id number] Chief Complaint  Patient presents with   Establish Care    The patient is a 57 year old male who presents today for evaluation of possible peripheral vascular disease.  He had ABIs done at an outside office which are not available to [place] but based on notes indicates that there was evidence of abnormal results.  The patient denies any claudication-like symptoms.,  He denies any rest pain or ulceration.  He notes that blood began this was actually swelling in his left lower extremity.  He notes that it has been ongoing for about a year or so and that it comes and goes.  He notes that the swelling is worse in the evening after working all day.  He notes that it has improved by morning.  Today he has an ABI of 0.98 on the right and 1.15 on the left.  He has normal TBI's bilaterally.  He has strong triphasic tibial artery waveforms bilaterally with normal toe waveforms bilaterally.    Review of Systems  Cardiovascular:  Positive for leg swelling.  All other systems reviewed and are negative.      Objective:   Physical Exam Vitals reviewed.  HENT:     Head: Normocephalic.  Cardiovascular:     Rate and Rhythm: Normal rate.     Pulses:          Dorsalis pedis pulses are 1+ on the right side and 1+ on the left side.       Posterior tibial pulses are detected w/ Doppler on the right side and detected w/ Doppler on the left side.  Pulmonary:     Effort: Pulmonary effort is normal.  Skin:    General: Skin is warm and dry.  Neurological:     Mental Status: He is alert and oriented to person, place, and time.  Psychiatric:        Mood and Affect: Mood normal.        Behavior: Behavior normal.        Thought Content: Thought content normal.        Judgment: Judgment normal.     BP (!) 144/83 (BP Location: Left Arm)   Pulse 72   Resp 17   Ht 5\' 8"  (1.727 m)   Wt 150 lb 9.6 oz (68.3 kg)   BMI  22.90 kg/m   Past Medical History:  Diagnosis Date   Anxiety    CAD (coronary artery disease)    HLD (hyperlipidemia)    Hypertension    Thrombocytopenia (HCC)     Social History   Socioeconomic History   Marital status: Married    Spouse name: Not on file   Number of children: Not on file   Years of education: Not on file   Highest education level: Not on file  Occupational History   Not on file  Tobacco Use   Smoking status: Former    Packs/day: 0.25    Years: 25.00    Additional pack years: 0.00    Total pack years: 6.25    Types: Cigarettes    Quit date: 09/2017    Years since quitting: 5.4   Smokeless tobacco: Never  Vaping Use   Vaping Use: Never used  Substance and Sexual Activity   Alcohol use: Yes    Alcohol/week: 8.0 standard drinks of alcohol    Types: 8 Cans of beer per  week   Drug use: No   Sexual activity: Not on file  Other Topics Concern   Not on file  Social History Narrative   Not on file   Social Determinants of Health   Financial Resource Strain: Not on file  Food Insecurity: Not on file  Transportation Needs: Not on file  Physical Activity: Not on file  Stress: Not on file  Social Connections: Not on file  Intimate Partner Violence: Not on file    Past Surgical History:  Procedure Laterality Date   COLONOSCOPY WITH PROPOFOL N/A 12/12/2015   Procedure: COLONOSCOPY WITH PROPOFOL;  Surgeon: Scot Jun, MD;  Location: [facility] ENDOSCOPY;  Service: Endoscopy;  Laterality: N/A;   COLONOSCOPY WITH PROPOFOL N/A 06/23/2022   Procedure: COLONOSCOPY WITH PROPOFOL;  Surgeon: Regis Bill, MD;  Location: [facility] ENDOSCOPY;  Service: Endoscopy;  Laterality: N/A;   CORONARY STENT INTERVENTION N/A 02/24/2017   Procedure: Coronary Stent Intervention;  Surgeon: Marcina Millard, MD;  Location: [facility] INVASIVE CV LAB;  Service: Cardiovascular;  Laterality: N/A;   CORONARY STENT INTERVENTION N/A 11/11/2018   Procedure: CORONARY STENT INTERVENTION;   Surgeon: Alwyn Pea, MD;  Location: [facility] INVASIVE CV LAB;  Service: Cardiovascular;  Laterality: N/A;   ESOPHAGOGASTRODUODENOSCOPY (EGD) WITH PROPOFOL N/A 06/23/2022   Procedure: ESOPHAGOGASTRODUODENOSCOPY (EGD) WITH PROPOFOL;  Surgeon: Regis Bill, MD;  Location: [facility] ENDOSCOPY;  Service: Endoscopy;  Laterality: N/A;   LEFT HEART CATH AND CORONARY ANGIOGRAPHY Left 02/24/2017   Procedure: Left Heart Cath and Coronary Angiography;  Surgeon: Marcina Millard, MD;  Location: [facility] INVASIVE CV LAB;  Service: Cardiovascular;  Laterality: Left;   LEFT HEART CATH AND CORONARY ANGIOGRAPHY N/A 11/11/2018   Procedure: LEFT HEART CATH AND CORONARY ANGIOGRAPHY with possible pci and stent;  Surgeon: Alwyn Pea, MD;  Location: [facility] INVASIVE CV LAB;  Service: Cardiovascular;  Laterality: N/A;   stents      Family History  Problem Relation Age of Onset   CAD Mother    Hypertension Mother    Cancer Father    CAD Father     Allergies  Allergen Reactions   Lopid [Gemfibrozil]    Statins Other (See Comments)    Pain and cramps       Latest Ref Rng & Units 05/27/2022   10:05 AM 11/11/2018    4:13 AM 11/10/2018    8:28 PM  CBC  WBC 3.8 - 10.8 Thousand/uL 7.9  8.0  8.6   Hemoglobin 13.2 - 17.1 g/dL 16.1  09.6  04.5   Hematocrit 38.5 - 50.0 % 48.4  45.0  47.9   Platelets 140 - 400 Thousand/uL 183  153  158       CMP     Component Value Date/Time   NA 141 05/27/2022 1005   K 3.7 05/27/2022 1005   CL 105 05/27/2022 1005   CO2 21 05/27/2022 1005   GLUCOSE 79 05/27/2022 1005   BUN 7 05/27/2022 1005   CREATININE 0.87 05/27/2022 1005   CALCIUM 9.2 05/27/2022 1005   PROT 6.7 05/27/2022 1005   ALBUMIN 4.4 11/10/2018 2028   AST 32 05/27/2022 1005   ALT 56 (H) 05/27/2022 1005   ALKPHOS 70 11/10/2018 2028   BILITOT 2.2 (H) 05/27/2022 1005   GFRNONAA 100 11/08/2020 1122   GFRAA 116 11/08/2020 1122     No results found.     Assessment & Plan:   1. Leg swelling Based  on the patient's description of symptoms I suspect he may  have some venous insufficiency.  We discussed the treatment for venous insufficiency he, which conservatively is use of medical grade compression elevation and activity.  We discussed that in order to diagnose the patient will need a venous reflux ultrasound.  The patient noted that he will contact us for further evaluation if the swelling continued.  Patient will follow-up as needed  2. Moderate mixed hyperlipidemia not requiring statin therapy Continue statin as ordered and reviewed, no changes at this time  3. Primary hypertension Continue antihypertensive medications as already ordered, these medications have been reviewed and there are no changes at this time.  4. PVD (peripheral vascular disease) (HCC) Today the patient has no evidence of arterial insufficiency in the lower extremities.  However I suspect that the lower extremity edema is related to some venous insufficiency as noted above.  No additional intervention recommended at this time for arterial disease.  Patient is medically optimized.   Current Outpatient Medications on File Prior to Visit  Medication Sig Dispense Refill   amLODipine (NORVASC) 10 MG tablet Take 1 tablet by mouth daily.     aspirin 81 MG tablet Take 81 mg by mouth daily.      clopidogrel (PLAVIX) 75 MG tablet Take 1 tablet (75 mg total) by mouth daily. 90 tablet 1   Coenzyme Q10 10 MG capsule Take 10 mg by mouth daily.     loratadine (CLARITIN) 10 MG tablet Take 1 tablet (10 mg total) by mouth daily. 90 tablet 1   metoprolol succinate (TOPROL-XL) 50 MG 24 hr tablet Take 50 mg by mouth daily. Take with or immediately following a meal.     pravastatin (PRAVACHOL) 40 MG tablet Take 1 tablet by mouth at bedtime.     No current facility-administered medications on file prior to visit.    There are no Patient Instructions on file for this visit. No follow-ups on file.   Georgiana Spinner, NP

## 2023-02-17 LAB — VAS US ABI WITH/WO TBI
Left ABI: 1.15
Right ABI: 0.98

## 2023-10-26 ENCOUNTER — Other Ambulatory Visit
Admission: RE | Admit: 2023-10-26 | Discharge: 2023-10-26 | Disposition: A | Discharge status: Home / Self Care | Payer: 59 | Source: Ambulatory Visit | Attending: Physician Assistant | Admitting: Physician Assistant

## 2023-10-26 DIAGNOSIS — R079 Chest pain, unspecified: Secondary | ICD-10-CM | POA: Diagnosis present

## 2023-10-26 LAB — TROPONIN I (HIGH SENSITIVITY): Troponin I (High Sensitivity): 28 ng/L — ABNORMAL HIGH (ref <18)

## 2023-10-27 ENCOUNTER — Ambulatory Visit
Admission: RE | Admit: 2023-10-27 | Discharge: 2023-10-27 | Disposition: A | Discharge status: Home / Self Care | Payer: 59 | Attending: Cardiology | Admitting: Cardiology

## 2023-10-27 ENCOUNTER — Encounter: Payer: Self-pay | Admitting: Cardiology

## 2023-10-27 ENCOUNTER — Ambulatory Visit
Admission: RE | Admit: 2023-10-27 | Discharge: 2023-10-27 | Disposition: A | Discharge status: Home / Self Care | Payer: 59 | Source: Home / Self Care | Attending: Physician Assistant | Admitting: Physician Assistant

## 2023-10-27 ENCOUNTER — Other Ambulatory Visit: Payer: Self-pay

## 2023-10-27 ENCOUNTER — Encounter
Admission: RE | Disposition: A | Discharge status: Home / Self Care | Payer: Self-pay | Source: Home / Self Care | Attending: Cardiology

## 2023-10-27 DIAGNOSIS — E876 Hypokalemia: Secondary | ICD-10-CM

## 2023-10-27 DIAGNOSIS — Z955 Presence of coronary angioplasty implant and graft: Secondary | ICD-10-CM | POA: Insufficient documentation

## 2023-10-27 DIAGNOSIS — I351 Nonrheumatic aortic (valve) insufficiency: Secondary | ICD-10-CM | POA: Insufficient documentation

## 2023-10-27 DIAGNOSIS — I2582 Chronic total occlusion of coronary artery: Secondary | ICD-10-CM | POA: Diagnosis not present

## 2023-10-27 DIAGNOSIS — R079 Chest pain, unspecified: Secondary | ICD-10-CM | POA: Diagnosis present

## 2023-10-27 DIAGNOSIS — Z01818 Encounter for other preprocedural examination: Secondary | ICD-10-CM

## 2023-10-27 DIAGNOSIS — I2510 Atherosclerotic heart disease of native coronary artery without angina pectoris: Secondary | ICD-10-CM | POA: Diagnosis not present

## 2023-10-27 HISTORY — PX: LEFT HEART CATH AND CORONARY ANGIOGRAPHY: CATH118249

## 2023-10-27 LAB — BASIC METABOLIC PANEL
Anion gap: 12 (ref 5–15)
BUN: 9 mg/dL (ref 6–20)
CO2: 24 mmol/L (ref 22–32)
Calcium: 9.2 mg/dL (ref 8.9–10.3)
Chloride: 101 mmol/L (ref 98–111)
Creatinine, Ser: 0.79 mg/dL (ref 0.61–1.24)
GFR, Estimated: >60 mL/min (ref >60)
Glucose, Bld: 115 mg/dL — ABNORMAL HIGH (ref 70–99)
Potassium: 2.7 mmol/L — CL (ref 3.5–5.1)
Sodium: 137 mmol/L (ref 135–145)

## 2023-10-27 LAB — ECHOCARDIOGRAM COMPLETE
AR max vel: 2.29 cm2
AV Area VTI: 2.79 cm2
AV Area mean vel: 2.49 cm2
AV Mean grad: 3 mm[Hg]
AV Peak grad: 6.2 mm[Hg]
Ao pk vel: 1.24 m/s
Area-P 1/2: 3.72 cm2
Height: 70 in
P 1/2 time: 607 ms
S' Lateral: 3.4 cm
Weight: 2432 [oz_av]

## 2023-10-27 SURGERY — LEFT HEART CATH AND CORONARY ANGIOGRAPHY
Anesthesia: Moderate Sedation | Laterality: Left

## 2023-10-27 MED ORDER — ONDANSETRON HCL 4 MG/2ML IJ SOLN: 4.0000 mg | Freq: Four times a day (QID) | INTRAMUSCULAR | Status: DC | PRN

## 2023-10-27 MED ORDER — POTASSIUM CHLORIDE 10 MEQ/100ML IV SOLN
INTRAVENOUS | Status: DC | PRN
  Administered 2023-10-27: 10 meq via INTRAVENOUS

## 2023-10-27 MED ORDER — POTASSIUM CHLORIDE 10 MEQ/100ML IV SOLN
10.0000 meq | INTRAVENOUS | Status: AC
  Administered 2023-10-27: 10 meq via INTRAVENOUS
  Filled 2023-10-27 (×2): qty 100

## 2023-10-27 MED ORDER — MAGNESIUM OXIDE -MG SUPPLEMENT 400 (240 MG) MG PO TABS
400.0000 mg | ORAL_TABLET | Freq: Once | ORAL | Status: AC
  Administered 2023-10-27: 400 mg via ORAL
  Filled 2023-10-27: qty 1

## 2023-10-27 MED ORDER — MIDAZOLAM HCL 2 MG/2ML IJ SOLN
INTRAMUSCULAR | Status: AC
Start: 2023-10-27 — End: ?
  Filled 2023-10-27: qty 2

## 2023-10-27 MED ORDER — IOHEXOL 300 MG/ML  SOLN
INTRAMUSCULAR | Status: DC | PRN
  Administered 2023-10-27: 90 mg

## 2023-10-27 MED ORDER — HYDRALAZINE HCL 20 MG/ML IJ SOLN: 10.0000 mg | INTRAMUSCULAR | Status: DC | PRN

## 2023-10-27 MED ORDER — FENTANYL CITRATE (PF) 100 MCG/2ML IJ SOLN
INTRAMUSCULAR | Status: AC
  Filled 2023-10-27: qty 2

## 2023-10-27 MED ORDER — SODIUM CHLORIDE 0.9 % IV SOLN: 250.0000 mL | INTRAVENOUS | Status: DC | PRN

## 2023-10-27 MED ORDER — HEPARIN (PORCINE) IN NACL 1000-0.9 UT/500ML-% IV SOLN
INTRAVENOUS | Status: AC
  Filled 2023-10-27: qty 1000

## 2023-10-27 MED ORDER — MAGNESIUM OXIDE -MG SUPPLEMENT 400 (240 MG) MG PO TABS
ORAL_TABLET | ORAL | Status: DC | PRN
  Administered 2023-10-27: 400 mg via ORAL

## 2023-10-27 MED ORDER — LIDOCAINE HCL 1 % IJ SOLN
INTRAMUSCULAR | Status: AC
  Filled 2023-10-27: qty 20

## 2023-10-27 MED ORDER — POTASSIUM CHLORIDE CRYS ER 10 MEQ PO TBCR: EXTENDED_RELEASE_TABLET | ORAL | Status: DC | PRN

## 2023-10-27 MED ORDER — HEPARIN SODIUM (PORCINE) 1000 UNIT/ML IJ SOLN
INTRAMUSCULAR | Status: DC | PRN
  Administered 2023-10-27: 3500 [IU] via INTRAVENOUS

## 2023-10-27 MED ORDER — ASPIRIN 81 MG PO CHEW: 81.0000 mg | CHEWABLE_TABLET | ORAL | Status: DC

## 2023-10-27 MED ORDER — SODIUM CHLORIDE 0.9 % WEIGHT BASED INFUSION
3.0000 mL/kg/h | INTRAVENOUS | Status: AC
  Administered 2023-10-27: 3 mL/kg/h via INTRAVENOUS

## 2023-10-27 MED ORDER — SODIUM CHLORIDE 0.9% FLUSH
3.0000 mL | Freq: Two times a day (BID) | INTRAVENOUS | Status: DC
Start: 2023-10-27 — End: 2023-10-27

## 2023-10-27 MED ORDER — POTASSIUM CHLORIDE CRYS ER 20 MEQ PO TBCR: 20.0000 meq | EXTENDED_RELEASE_TABLET | Freq: Two times a day (BID) | ORAL | Status: DC

## 2023-10-27 MED ORDER — SODIUM CHLORIDE 0.9% FLUSH: 3.0000 mL | INTRAVENOUS | Status: DC | PRN

## 2023-10-27 MED ORDER — ACETAMINOPHEN 325 MG PO TABS: 650.0000 mg | ORAL_TABLET | ORAL | Status: DC | PRN

## 2023-10-27 MED ORDER — LIDOCAINE HCL (PF) 1 % IJ SOLN
INTRAMUSCULAR | Status: DC | PRN
  Administered 2023-10-27: 2 mL

## 2023-10-27 MED ORDER — POTASSIUM CHLORIDE CRYS ER 20 MEQ PO TBCR
EXTENDED_RELEASE_TABLET | ORAL | Status: AC
  Administered 2023-10-27: 20 meq via ORAL
  Filled 2023-10-27: qty 1

## 2023-10-27 MED ORDER — VERAPAMIL HCL 2.5 MG/ML IV SOLN
INTRAVENOUS | Status: DC | PRN
  Administered 2023-10-27: 2.5 mg via INTRAVENOUS

## 2023-10-27 MED ORDER — SODIUM CHLORIDE 0.9% FLUSH: 3.0000 mL | Freq: Two times a day (BID) | INTRAVENOUS | Status: DC

## 2023-10-27 MED ORDER — FENTANYL CITRATE (PF) 100 MCG/2ML IJ SOLN
INTRAMUSCULAR | Status: DC | PRN
  Administered 2023-10-27: 25 ug via INTRAVENOUS

## 2023-10-27 MED ORDER — LABETALOL HCL 5 MG/ML IV SOLN: 10.0000 mg | INTRAVENOUS | Status: DC | PRN

## 2023-10-27 MED ORDER — HEPARIN (PORCINE) IN NACL 2000-0.9 UNIT/L-% IV SOLN
INTRAVENOUS | Status: DC | PRN
  Administered 2023-10-27: 1000 mL

## 2023-10-27 MED ORDER — HEPARIN SODIUM (PORCINE) 1000 UNIT/ML IJ SOLN
INTRAMUSCULAR | Status: AC
  Filled 2023-10-27: qty 10

## 2023-10-27 MED ORDER — SODIUM CHLORIDE 0.9 % WEIGHT BASED INFUSION: 1.0000 mL/kg/h | INTRAVENOUS | Status: DC

## 2023-10-27 MED ORDER — VERAPAMIL HCL 2.5 MG/ML IV SOLN
INTRAVENOUS | Status: AC
  Filled 2023-10-27: qty 2

## 2023-10-27 MED ORDER — MIDAZOLAM HCL 2 MG/2ML IJ SOLN
INTRAMUSCULAR | Status: DC | PRN
  Administered 2023-10-27: 1 mg via INTRAVENOUS

## 2023-10-27 SURGICAL SUPPLY — 12 items
CATH 5FR JL3.5 JR4 ANG PIG MP (CATHETERS) IMPLANT
CATH INFINITI 5FR JL4 (CATHETERS) IMPLANT
DEVICE RAD TR BAND REGULAR (VASCULAR PRODUCTS) IMPLANT
DRAPE BRACHIAL (DRAPES) IMPLANT
GLIDESHEATH SLEND SS 6F .021 (SHEATH) IMPLANT
GUIDEWIRE INQWIRE 1.5J.035X260 (WIRE) IMPLANT
INQWIRE 1.5J .035X260CM (WIRE) ×1 IMPLANT
KIT SYRINGE INJ CVI SPIKEX1 (MISCELLANEOUS) IMPLANT
PACK CARDIAC CATH (CUSTOM PROCEDURE TRAY) ×1 IMPLANT
PROTECTION STATION PRESSURIZED (MISCELLANEOUS) ×1 IMPLANT
SET ATX-X65L (MISCELLANEOUS) IMPLANT
STATION PROTECTION PRESSURIZED (MISCELLANEOUS) IMPLANT

## 2023-10-27 NOTE — Progress Notes (Signed)
 Dr. Parks Bollman in at bedside, speaking with pt. And his brother [name] re: cath results and need for CABG. Pt. And brother verbalized understanding of conversation.

## 2023-10-27 NOTE — Progress Notes (Signed)
*  PRELIMINARY RESULTS* Echocardiogram 2D Echocardiogram has been performed.  [name] 10/27/2023, 10:56 AM

## 2023-10-28 NOTE — Progress Notes (Signed)
 [address]       [place] [address]             [phone]        PAULMICHAEL SCHRECK [facility] Medical Record #[id number] Date of Birth: 1966/06/24  Referring: Ammon Blunt, MD Primary Care: Whitaker, Selinda Moose, PA-C Primary Cardiologist:None  Chief Complaint:    Chief Complaint  Patient presents with   Coronary Artery Disease    History of Present Illness:     [name] 58 y.o. 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  He started experiencing anginal symptoms in Aug of last year.  He had a + stress test, and of late has been having some rest pain 1/week.   Aug sept last year Worst 3-4 weeks ago + stre  Past Medical and Surgical History: Previous Chest Surgery: no Previous Chest Radiation: no Diabetes Mellitus: no.  HbA1C pending Creatinine:  Lab Results  Component Value Date   CREATININE 0.79 10/27/2023   CREATININE 0.87 05/27/2022   CREATININE 0.79 11/08/2020     Past Medical History:  Diagnosis Date   Anxiety    CAD (coronary artery disease)    HLD (hyperlipidemia)    Hypertension    Thrombocytopenia (HCC)     Past Surgical History:  Procedure Laterality Date   COLONOSCOPY WITH PROPOFOL  N/A 12/12/2015   Procedure: COLONOSCOPY WITH PROPOFOL ;  Surgeon: Lamar ONEIDA Holmes, MD;  Location: [facility] ENDOSCOPY;  Service: Endoscopy;  Laterality: N/A;   COLONOSCOPY WITH PROPOFOL  N/A 06/23/2022   Procedure: COLONOSCOPY WITH PROPOFOL ;  Surgeon: Maryruth Ole ONEIDA, MD;  Location: [facility] ENDOSCOPY;  Service: Endoscopy;  Laterality: N/A;   CORONARY STENT INTERVENTION N/A 02/24/2017   Procedure: Coronary Stent Intervention;  Surgeon: Ammon Blunt, MD;  Location: [facility] INVASIVE CV LAB;  Service: Cardiovascular;  Laterality: N/A;   CORONARY STENT INTERVENTION N/A 11/11/2018   Procedure: CORONARY STENT INTERVENTION;  Surgeon: Florencio Cara BIRCH, MD;  Location: [facility] INVASIVE CV LAB;  Service: Cardiovascular;  Laterality: N/A;    ESOPHAGOGASTRODUODENOSCOPY (EGD) WITH PROPOFOL  N/A 06/23/2022   Procedure: ESOPHAGOGASTRODUODENOSCOPY (EGD) WITH PROPOFOL ;  Surgeon: Maryruth Ole ONEIDA, MD;  Location: [facility] ENDOSCOPY;  Service: Endoscopy;  Laterality: N/A;   LEFT HEART CATH AND CORONARY ANGIOGRAPHY Left 02/24/2017   Procedure: Left Heart Cath and Coronary Angiography;  Surgeon: Ammon Blunt, MD;  Location: [facility] INVASIVE CV LAB;  Service: Cardiovascular;  Laterality: Left;   LEFT HEART CATH AND CORONARY ANGIOGRAPHY N/A 11/11/2018   Procedure: LEFT HEART CATH AND CORONARY ANGIOGRAPHY with possible pci and stent;  Surgeon: Florencio Cara BIRCH, MD;  Location: [facility] INVASIVE CV LAB;  Service: Cardiovascular;  Laterality: N/A;   LEFT HEART CATH AND CORONARY ANGIOGRAPHY Left 10/27/2023   Procedure: LEFT HEART CATH AND CORONARY ANGIOGRAPHY;  Surgeon: Ammon Blunt, MD;  Location: [facility] INVASIVE CV LAB;  Service: Cardiovascular;  Laterality: Left;   stents      Social History:  Social History   Tobacco Use  Smoking Status Former   Current packs/day: 0.00   Average packs/day: 0.3 packs/day for 25.0 years (6.3 ttl pk-yrs)   Types: Cigarettes   Start date: 09/1992   Quit date: 09/2017   Years since quitting: 6.1  Smokeless Tobacco Never    Social History   Substance and Sexual Activity  Alcohol Use Yes   Alcohol/week: 8.0 standard drinks of alcohol   Types: 8 Cans of beer per week     Allergies  Allergen Reactions  Lopid [Gemfibrozil] Other (See Comments)    Legs hurt   Statins Other (See Comments)    Pain and cramps      Anti-Coagulation: plavix   Current Outpatient Medications  Medication Sig Dispense Refill   amLODipine  (NORVASC ) 10 MG tablet Take 10 mg by mouth daily.     aspirin  81 MG tablet Take 81 mg by mouth daily.      clopidogrel  (PLAVIX ) 75 MG tablet Take 1 tablet (75 mg total) by mouth daily. 90 tablet 1   Coenzyme Q10 100 MG TABS Take 100 mg by mouth daily.     loratadine  (CLARITIN ) 10 MG  tablet Take 1 tablet (10 mg total) by mouth daily. 90 tablet 1   metoprolol  succinate (TOPROL -XL) 50 MG 24 hr tablet Take 75 mg by mouth daily. Take with or immediately following a meal.     Omega-3 Fatty Acids (FISH OIL PO) Take 690 mg by mouth daily.     pravastatin  (PRAVACHOL ) 20 MG tablet Take 20 mg by mouth at bedtime.     No current facility-administered medications for this visit.    (Not in a hospital admission)   Family History  Problem Relation Age of Onset   CAD Mother    Hypertension Mother    Cancer Father    CAD Father      Review of Systems:   Review of Systems  Constitutional:  Positive for malaise/fatigue.  Respiratory:  Positive for shortness of breath.   Cardiovascular:  Positive for chest pain.  Neurological: Negative.       Physical Exam: BP (!) 145/84 (BP Location: Left Arm)   Pulse 76   Resp 18   Ht 5' 10 (1.778 m)   Wt 156 lb (70.8 kg)   SpO2 97% Comment: RA  BMI 22.38 kg/m  Physical Exam Constitutional:      General: He is not in acute distress.    Appearance: He is not ill-appearing.  HENT:     Head: Normocephalic and atraumatic.  Eyes:     Extraocular Movements: Extraocular movements intact.  Cardiovascular:     Rate and Rhythm: Normal rate.  Pulmonary:     Effort: Pulmonary effort is normal. No respiratory distress.  Abdominal:     General: Abdomen is flat. There is no distension.  Musculoskeletal:        General: Normal range of motion.  Skin:    General: Skin is warm and dry.  Neurological:     General: No focal deficit present.     Mental Status: He is alert and oriented to person, place, and time.       Diagnostic Studies & Laboratory data:    Left Heart Catherization:  Intervention Echo: IMPRESSIONS     1. Left ventricular ejection fraction, by estimation, is 55 to 60%. The  left ventricle has normal function. The left ventricle has no regional  wall motion abnormalities. There is moderate asymmetric left  ventricular  hypertrophy of the basal-septal and  septal segments. Left ventricular diastolic parameters are consistent with  Grade I diastolic dysfunction (impaired relaxation).   2. Right ventricular systolic function is normal. The right ventricular  size is normal.   3. The mitral valve is normal in structure. No evidence of mitral valve  regurgitation.   4. The aortic valve is normal in structure. Aortic valve regurgitation is  mild. Aortic valve sclerosis is present, with no evidence of aortic valve  stenosis.     EKG: Sinus  I have  independently reviewed the above radiologic studies and discussed with the patient   Recent Lab Findings: Lab Results  Component Value Date   WBC 7.9 05/27/2022   HGB 16.7 05/27/2022   HCT 48.4 05/27/2022   PLT 183 05/27/2022   GLUCOSE 115 (H) 10/27/2023   CHOL 183 05/27/2022   TRIG 155 (H) 05/27/2022   HDL 43 05/27/2022   LDLCALC 113 (H) 05/27/2022   ALT 56 (H) 05/27/2022   AST 32 05/27/2022   NA 137 10/27/2023   K 2.7 (LL) 10/27/2023   CL 101 10/27/2023   CREATININE 0.79 10/27/2023   BUN 9 10/27/2023   CO2 24 10/27/2023   TSH 0.80 05/27/2022   INR 0.97 11/10/2018      Assessment / Plan:   58yo male with 3V CAD.  Echocardiogram shows preserved biventricular function and no significant valvular disease.  The risks and benefits of CABG were discussed in detail.  The patient is agreeable to proceed.  His plavix  will need to be held for 5 days prior to surgery   LAD, PDA, OM1   I  spent 40 minutes counseling the patient face to face.   [name] 11/02/2023 7:50 AM

## 2023-10-28 NOTE — H&P (View-Only) (Signed)
 [address]       [place] [address]             [phone]        [name] [facility] Medical Record #[id number] Date of Birth: [birth year]  Referring: Marcina Millard, MD Primary Care: Whitaker, Jonnie Finner, PA-C Primary Cardiologist:None  Chief Complaint:    Chief Complaint  Patient presents with   Coronary Artery Disease    History of Present Illness:     [name] 58 y.o. 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  He started experiencing anginal symptoms in Aug of last year.  He had a + stress test, and of late has been having some rest pain 1/week.   Aug sept last year Worst 3-4 weeks ago + stre  Past Medical and Surgical History: Previous Chest Surgery: no Previous Chest Radiation: no Diabetes Mellitus: no.  HbA1C pending Creatinine:  Lab Results  Component Value Date   CREATININE 0.79 10/27/2023   CREATININE 0.87 05/27/2022   CREATININE 0.79 11/08/2020     Past Medical History:  Diagnosis Date   Anxiety    CAD (coronary artery disease)    HLD (hyperlipidemia)    Hypertension    Thrombocytopenia (HCC)     Past Surgical History:  Procedure Laterality Date   COLONOSCOPY WITH PROPOFOL N/A 12/12/2015   Procedure: COLONOSCOPY WITH PROPOFOL;  Surgeon: Scot Jun, MD;  Location: [facility] ENDOSCOPY;  Service: Endoscopy;  Laterality: N/A;   COLONOSCOPY WITH PROPOFOL N/A 06/23/2022   Procedure: COLONOSCOPY WITH PROPOFOL;  Surgeon: Regis Bill, MD;  Location: [facility] ENDOSCOPY;  Service: Endoscopy;  Laterality: N/A;   CORONARY STENT INTERVENTION N/A 02/24/2017   Procedure: Coronary Stent Intervention;  Surgeon: Marcina Millard, MD;  Location: [facility] INVASIVE CV LAB;  Service: Cardiovascular;  Laterality: N/A;   CORONARY STENT INTERVENTION N/A 11/11/2018   Procedure: CORONARY STENT INTERVENTION;  Surgeon: Alwyn Pea, MD;  Location: [facility] INVASIVE CV LAB;  Service: Cardiovascular;  Laterality: N/A;    ESOPHAGOGASTRODUODENOSCOPY (EGD) WITH PROPOFOL N/A 06/23/2022   Procedure: ESOPHAGOGASTRODUODENOSCOPY (EGD) WITH PROPOFOL;  Surgeon: Regis Bill, MD;  Location: [facility] ENDOSCOPY;  Service: Endoscopy;  Laterality: N/A;   LEFT HEART CATH AND CORONARY ANGIOGRAPHY Left 02/24/2017   Procedure: Left Heart Cath and Coronary Angiography;  Surgeon: Marcina Millard, MD;  Location: [facility] INVASIVE CV LAB;  Service: Cardiovascular;  Laterality: Left;   LEFT HEART CATH AND CORONARY ANGIOGRAPHY N/A 11/11/2018   Procedure: LEFT HEART CATH AND CORONARY ANGIOGRAPHY with possible pci and stent;  Surgeon: Alwyn Pea, MD;  Location: [facility] INVASIVE CV LAB;  Service: Cardiovascular;  Laterality: N/A;   LEFT HEART CATH AND CORONARY ANGIOGRAPHY Left 10/27/2023   Procedure: LEFT HEART CATH AND CORONARY ANGIOGRAPHY;  Surgeon: Marcina Millard, MD;  Location: [facility] INVASIVE CV LAB;  Service: Cardiovascular;  Laterality: Left;   stents      Social History:  Social History   Tobacco Use  Smoking Status Former   Current packs/day: 0.00   Average packs/day: 0.3 packs/day for 25.0 years (6.3 ttl pk-yrs)   Types: Cigarettes   Start date: 09/1992   Quit date: 09/2017   Years since quitting: 6.1  Smokeless Tobacco Never    Social History   Substance and Sexual Activity  Alcohol Use Yes   Alcohol/week: 8.0 standard drinks of alcohol   Types: 8 Cans of beer per week     Allergies  Allergen Reactions  Lopid [Gemfibrozil] Other (See Comments)    Legs hurt   Statins Other (See Comments)    Pain and cramps      Anti-Coagulation: plavix  Current Outpatient Medications  Medication Sig Dispense Refill   amLODipine (NORVASC) 10 MG tablet Take 10 mg by mouth daily.     aspirin 81 MG tablet Take 81 mg by mouth daily.      clopidogrel (PLAVIX) 75 MG tablet Take 1 tablet (75 mg total) by mouth daily. 90 tablet 1   Coenzyme Q10 100 MG TABS Take 100 mg by mouth daily.     loratadine (CLARITIN) 10 MG  tablet Take 1 tablet (10 mg total) by mouth daily. 90 tablet 1   metoprolol succinate (TOPROL-XL) 50 MG 24 hr tablet Take 75 mg by mouth daily. Take with or immediately following a meal.     Omega-3 Fatty Acids (FISH OIL PO) Take 690 mg by mouth daily.     pravastatin (PRAVACHOL) 20 MG tablet Take 20 mg by mouth at bedtime.     No current facility-administered medications for this visit.    (Not in a hospital admission)   Family History  Problem Relation Age of Onset   CAD Mother    Hypertension Mother    Cancer Father    CAD Father      Review of Systems:   Review of Systems  Constitutional:  Positive for malaise/fatigue.  Respiratory:  Positive for shortness of breath.   Cardiovascular:  Positive for chest pain.  Neurological: Negative.       Physical Exam: BP (!) 145/84 (BP Location: Left Arm)   Pulse 76   Resp 18   Ht 5\' 10"  (1.778 m)   Wt 156 lb (70.8 kg)   SpO2 97% Comment: RA  BMI 22.38 kg/m  Physical Exam Constitutional:      General: He is not in acute distress.    Appearance: He is not ill-appearing.  HENT:     Head: Normocephalic and atraumatic.  Eyes:     Extraocular Movements: Extraocular movements intact.  Cardiovascular:     Rate and Rhythm: Normal rate.  Pulmonary:     Effort: Pulmonary effort is normal. No respiratory distress.  Abdominal:     General: Abdomen is flat. There is no distension.  Musculoskeletal:        General: Normal range of motion.  Skin:    General: Skin is warm and dry.  Neurological:     General: No focal deficit present.     Mental Status: He is alert and oriented to person, place, and time.       Diagnostic Studies & Laboratory data:    Left Heart Catherization:  Intervention Echo: IMPRESSIONS     1. Left ventricular ejection fraction, by estimation, is 55 to 60%. The  left ventricle has normal function. The left ventricle has no regional  wall motion abnormalities. There is moderate asymmetric left  ventricular  hypertrophy of the basal-septal and  septal segments. Left ventricular diastolic parameters are consistent with  Grade I diastolic dysfunction (impaired relaxation).   2. Right ventricular systolic function is normal. The right ventricular  size is normal.   3. The mitral valve is normal in structure. No evidence of mitral valve  regurgitation.   4. The aortic valve is normal in structure. Aortic valve regurgitation is  mild. Aortic valve sclerosis is present, with no evidence of aortic valve  stenosis.     EKG: Sinus  I have  independently reviewed the above radiologic studies and discussed with the patient   Recent Lab Findings: Lab Results  Component Value Date   WBC 7.9 05/27/2022   HGB 16.7 05/27/2022   HCT 48.4 05/27/2022   PLT 183 05/27/2022   GLUCOSE 115 (H) 10/27/2023   CHOL 183 05/27/2022   TRIG 155 (H) 05/27/2022   HDL 43 05/27/2022   LDLCALC 113 (H) 05/27/2022   ALT 56 (H) 05/27/2022   AST 32 05/27/2022   NA 137 10/27/2023   K 2.7 (LL) 10/27/2023   CL 101 10/27/2023   CREATININE 0.79 10/27/2023   BUN 9 10/27/2023   CO2 24 10/27/2023   TSH 0.80 05/27/2022   INR 0.97 11/10/2018      Assessment / Plan:   58yo male with 3V CAD.  Echocardiogram shows preserved biventricular function and no significant valvular disease.  The risks and benefits of CABG were discussed in detail.  The patient is agreeable to proceed.  His plavix will need to be held for 5 days prior to surgery   LAD, PDA, OM1   I  spent 40 minutes counseling the patient face to face.   Corliss Skains 11/02/2023 7:50 AM

## 2023-10-29 ENCOUNTER — Encounter: Payer: Self-pay | Admitting: *Deleted

## 2023-10-29 ENCOUNTER — Institutional Professional Consult (permissible substitution): Payer: 59 | Admitting: Thoracic Surgery (Cardiothoracic Vascular Surgery)

## 2023-10-29 ENCOUNTER — Other Ambulatory Visit: Payer: Self-pay | Admitting: *Deleted

## 2023-10-29 VITALS — BP 145/84 | HR 76 | Resp 18 | Ht 70.0 in | Wt 156.0 lb

## 2023-10-29 DIAGNOSIS — I25118 Atherosclerotic heart disease of native coronary artery with other forms of angina pectoris: Secondary | ICD-10-CM

## 2023-11-03 NOTE — Progress Notes (Signed)
Surgical Instructions   Your procedure is scheduled on Monday, November 08, 2023. Report to [facility] Main Entrance "A" at 8:30 A.M., then check in with the Admitting office. Any questions or running late day of surgery: call [phone]  Questions prior to your surgery date: call [phone], Monday-Friday, 8am-4pm. If you experience any cold or flu symptoms such as cough, fever, chills, shortness of breath, etc. between now and your scheduled surgery, please notify us at the above number.     Remember:  Do not eat or drink after midnight the night before your surgery   Take these medicines the morning of surgery with A SIP OF WATER: amLODipine (NORVASC)  metoprolol succinate (TOPROL-XL)   May take these medicines IF NEEDED:  Do NOT take your Aspirin on the day of your surgery.  Your last dose of Aspirin should be on Sunday, 11-07-23.  Follow your cardiologist's instructions and stop taking your clopidogrel (PLAVIX) 5 days prior to your surgery.  Your last of clopidogrel (PLAVIX) should be on Tuesday, 11-02-23.    One week prior to surgery, STOP taking any Aleve, Naproxen, Ibuprofen, Motrin, Advil, Goody's, BC's, all herbal medications, fish oil, and non-prescription vitamins.                     Do NOT Smoke (Tobacco/Vaping) for 24 hours prior to your procedure.  If you use a CPAP at night, you may bring your mask/headgear for your overnight stay.   You will be asked to remove any contacts, glasses, piercing's, hearing aid's, dentures/partials prior to surgery. Please bring cases for these items if needed.    Patients discharged the day of surgery will not be allowed to drive home, and someone needs to stay with them for 24 hours.  SURGICAL WAITING ROOM VISITATION Patients may have no more than 2 support people in the waiting area - these visitors may rotate.   Pre-op nurse will coordinate an appropriate time for 1 ADULT support person, who may not rotate, to accompany  patient in pre-op.  Children under the age of 91 must have an adult with them who is not the patient and must remain in the main waiting area with an adult.  If the patient needs to stay at the hospital during part of their recovery, the visitor guidelines for inpatient rooms apply.  Please refer to the [facility] website for the visitor guidelines for any additional information.   If you received a COVID test during your pre-op visit  it is requested that you wear a mask when out in public, stay away from anyone that may not be feeling well and notify your surgeon if you develop symptoms. If you have been in contact with anyone that has tested positive in the last 10 days please notify you surgeon.      Pre-operative CHG Bathing Instructions   You can play a key role in reducing the risk of infection after surgery. Your skin needs to be as free of germs as possible. You can reduce the number of germs on your skin by washing with CHG (chlorhexidine gluconate) soap before surgery. CHG is an antiseptic soap that kills germs and continues to kill germs even after washing.   DO NOT use if you have an allergy to chlorhexidine/CHG or antibacterial soaps. If your skin becomes reddened or irritated, stop using the CHG and notify one of our RNs at [phone].  TAKE A SHOWER THE NIGHT BEFORE SURGERY AND THE DAY OF SURGERY    Please keep in mind the following:  DO NOT shave, including legs and underarms, 48 hours prior to surgery.   You may shave your face before/day of surgery.  Place clean sheets on your bed the night before surgery Use a clean washcloth (not used since being washed) for each shower. DO NOT sleep with pet's night before surgery.  CHG Shower Instructions:  Wash your face and private area with normal soap. If you choose to wash your hair, wash first with your normal shampoo.  After you use shampoo/soap, rinse your hair and body thoroughly to remove shampoo/soap  residue.  Turn the water OFF and apply half the bottle of CHG soap to a CLEAN washcloth.  Apply CHG soap ONLY FROM YOUR NECK DOWN TO YOUR TOES (washing for 3-5 minutes)  DO NOT use CHG soap on face, private areas, open wounds, or sores.  Pay special attention to the area where your surgery is being performed.  If you are having back surgery, having someone wash your back for you may be helpful. Wait 2 minutes after CHG soap is applied, then you may rinse off the CHG soap.  Pat dry with a clean towel  Put on clean pajamas    Additional instructions for the day of surgery: DO NOT APPLY any lotions, deodorants, cologne, or perfumes.   Do not wear jewelry or makeup Do not wear nail polish, gel polish, artificial nails, or any other type of covering on natural nails (fingers and toes) Do not bring valuables to the hospital. [facility] is not responsible for valuables/personal belongings. Put on clean/comfortable clothes.  Please brush your teeth.  Ask your nurse before applying any prescription medications to the skin.

## 2023-11-04 ENCOUNTER — Ambulatory Visit (HOSPITAL_COMMUNITY)
Admission: RE | Admit: 2023-11-04 | Discharge: 2023-11-04 | Disposition: A | Discharge status: Home / Self Care | Payer: 59 | Source: Ambulatory Visit | Attending: Thoracic Surgery (Cardiothoracic Vascular Surgery) | Admitting: Thoracic Surgery (Cardiothoracic Vascular Surgery)

## 2023-11-04 ENCOUNTER — Ambulatory Visit (HOSPITAL_COMMUNITY)
Admission: RE | Admit: 2023-11-04 | Discharge: 2023-11-04 | Disposition: A | Discharge status: Home / Self Care | Payer: 59 | Source: Ambulatory Visit | Attending: Anesthesiology

## 2023-11-04 ENCOUNTER — Other Ambulatory Visit: Payer: Self-pay

## 2023-11-04 ENCOUNTER — Encounter (HOSPITAL_COMMUNITY)
Admission: RE | Admit: 2023-11-04 | Discharge: 2023-11-04 | Disposition: A | Discharge status: Home / Self Care | Payer: 59 | Source: Ambulatory Visit | Attending: Thoracic Surgery (Cardiothoracic Vascular Surgery) | Admitting: Thoracic Surgery (Cardiothoracic Vascular Surgery)

## 2023-11-04 ENCOUNTER — Encounter (HOSPITAL_COMMUNITY): Payer: Self-pay

## 2023-11-04 VITALS — BP 145/70 | HR 61 | Temp 97.9°F | Resp 17 | Ht 70.0 in | Wt 155.8 lb

## 2023-11-04 DIAGNOSIS — I25118 Atherosclerotic heart disease of native coronary artery with other forms of angina pectoris: Secondary | ICD-10-CM | POA: Diagnosis present

## 2023-11-04 DIAGNOSIS — Z01818 Encounter for other preprocedural examination: Secondary | ICD-10-CM | POA: Diagnosis present

## 2023-11-04 HISTORY — DX: Ventricular premature depolarization: I49.3

## 2023-11-04 LAB — VAS US DOPPLER PRE CABG

## 2023-11-04 LAB — CBC
HCT: 46.8 % (ref 39.0–52.0)
Hemoglobin: 17 g/dL (ref 13.0–17.0)
MCH: 32.1 pg (ref 26.0–34.0)
MCHC: 36.3 g/dL — ABNORMAL HIGH (ref 30.0–36.0)
MCV: 88.5 fL (ref 80.0–100.0)
Platelets: 236 10*3/uL (ref 150–400)
RBC: 5.29 MIL/uL (ref 4.22–5.81)
RDW: 12.7 % (ref 11.5–15.5)
WBC: 10.7 10*3/uL — ABNORMAL HIGH (ref 4.0–10.5)
nRBC: 0 % (ref 0.0–0.2)

## 2023-11-04 LAB — COMPREHENSIVE METABOLIC PANEL
ALT: 20 U/L (ref 0–44)
AST: 20 U/L (ref 15–41)
Albumin: 4.2 g/dL (ref 3.5–5.0)
Alkaline Phosphatase: 96 U/L (ref 38–126)
Anion gap: 11 (ref 5–15)
BUN: 6 mg/dL (ref 6–20)
CO2: 27 mmol/L (ref 22–32)
Calcium: 9.6 mg/dL (ref 8.9–10.3)
Chloride: 102 mmol/L (ref 98–111)
Creatinine, Ser: 0.83 mg/dL (ref 0.61–1.24)
GFR, Estimated: >60 mL/min (ref >60)
Glucose, Bld: 109 mg/dL — ABNORMAL HIGH (ref 70–99)
Potassium: 3.7 mmol/L (ref 3.5–5.1)
Sodium: 140 mmol/L (ref 135–145)
Total Bilirubin: 1.8 mg/dL — ABNORMAL HIGH (ref 0.0–1.2)
Total Protein: 8.6 g/dL — ABNORMAL HIGH (ref 6.5–8.1)

## 2023-11-04 LAB — URINALYSIS, ROUTINE W REFLEX MICROSCOPIC
Bilirubin Urine: NEGATIVE
Glucose, UA: NEGATIVE mg/dL
Ketones, ur: NEGATIVE mg/dL
Leukocytes,Ua: NEGATIVE
Nitrite: NEGATIVE
Protein, ur: NEGATIVE mg/dL
Specific Gravity, Urine: 1.01 (ref 1.005–1.030)
pH: 6 (ref 5.0–8.0)

## 2023-11-04 LAB — TYPE AND SCREEN
ABO/RH(D): O POS
Antibody Screen: NEGATIVE

## 2023-11-04 LAB — SURGICAL PCR SCREEN
MRSA, PCR: NEGATIVE
Staphylococcus aureus: NEGATIVE

## 2023-11-04 LAB — HEMOGLOBIN A1C
Hgb A1c MFr Bld: 4.7 % — ABNORMAL LOW (ref 4.8–5.6)
Mean Plasma Glucose: 88.19 mg/dL

## 2023-11-04 LAB — PROTIME-INR
INR: 1 (ref 0.8–1.2)
Prothrombin Time: 13.5 s (ref 11.4–15.2)

## 2023-11-04 LAB — APTT: aPTT: 32 s (ref 24–36)

## 2023-11-04 NOTE — Progress Notes (Incomplete)
VASCULAR LAB    CABG Dopplers has been performed.  See CV proc for preliminary results.   Tonita Bills, RVT 11/04/2023, 11:19 AM

## 2023-11-04 NOTE — Progress Notes (Signed)
Incorrect arrival time was given to pt at pre-admission appointment. Per Darius Bump, RN with [facility], pts arrival time for surgery on Monday, February 17th is 7:30 AM. Pt was called and updated with this information and verbalized understanding.

## 2023-11-04 NOTE — Progress Notes (Signed)
PCP - Dr. Wilford Corner Cardiologist - Dr. Marcina Millard  PPM/ICD - Denies Device Orders - n/a Rep Notified - n/a  Chest x-ray - N/A EKG - 10-27-23 Stress Test - Denies ECHO - 10-27-23 Cardiac Cath - 10-27-23  Sleep Study - Denies CPAP - n/a  NON-Diabetic  Last dose of GLP1 agonist-  Denies GLP1 instructions: n/a  Blood Thinner Instructions: Plavix - last dose to be on 2/11 however patient took last dose on 2/9. Aspirin Instructions: instructed to take last dose on 2/16  ERAS Protcol - NPO PRE-SURGERY Ensure or G2- none  COVID TEST- No   Anesthesia review: Yes, HTN and CAD, had cath on 10/27/23.   Patient denies shortness of breath, fever, cough and chest pain at PAT appointment> patient denies any respiratory issues at this time.    All instructions explained to the patient, with a verbal understanding of the material. Patient agrees to go over the instructions while at home for a better understanding. Patient also instructed to self quarantine after being tested for COVID-19. The opportunity to ask questions was provided.

## 2023-11-05 ENCOUNTER — Encounter: Payer: 59 | Admitting: Thoracic Surgery (Cardiothoracic Vascular Surgery)

## 2023-11-05 MED ORDER — PLASMA-LYTE A IV SOLN
INTRAVENOUS | Status: DC
  Filled 2023-11-05: qty 2.5

## 2023-11-05 MED ORDER — TRANEXAMIC ACID 1000 MG/10ML IV SOLN
1.5000 mg/kg/h | INTRAVENOUS | Status: DC
  Filled 2023-11-05: qty 25

## 2023-11-05 MED ORDER — CEFAZOLIN SODIUM-DEXTROSE 2-4 GM/100ML-% IV SOLN
2.0000 g | INTRAVENOUS | Status: DC
  Filled 2023-11-05: qty 100

## 2023-11-05 MED ORDER — DEXMEDETOMIDINE HCL IN NACL 400 MCG/100ML IV SOLN
0.1000 ug/kg/h | INTRAVENOUS | Status: DC
  Filled 2023-11-05: qty 100

## 2023-11-05 MED ORDER — VANCOMYCIN HCL 1250 MG/250ML IV SOLN
1250.0000 mg | INTRAVENOUS | Status: AC
  Administered 2023-11-08: 1250 mg via INTRAVENOUS
  Filled 2023-11-05: qty 250

## 2023-11-05 MED ORDER — TRANEXAMIC ACID (OHS) BOLUS VIA INFUSION
15.0000 mg/kg | INTRAVENOUS | Status: DC
  Filled 2023-11-05: qty 1061

## 2023-11-05 MED ORDER — TRANEXAMIC ACID 1000 MG/10ML IV SOLN
1.5000 mg/kg/h | INTRAVENOUS | Status: AC
  Administered 2023-11-08: 1.5 mg/kg/h via INTRAVENOUS
  Filled 2023-11-05: qty 25

## 2023-11-05 MED ORDER — PHENYLEPHRINE HCL-NACL 20-0.9 MG/250ML-% IV SOLN
30.0000 ug/min | INTRAVENOUS | Status: AC
  Administered 2023-11-08: 30 ug/min via INTRAVENOUS
  Filled 2023-11-05: qty 250

## 2023-11-05 MED ORDER — NOREPINEPHRINE 4 MG/250ML-% IV SOLN
0.0000 ug/min | INTRAVENOUS | Status: DC
  Filled 2023-11-05: qty 250

## 2023-11-05 MED ORDER — HEPARIN 30,000 UNITS/1000 ML (OHS) CELLSAVER SOLUTION
Status: DC
  Filled 2023-11-05: qty 1000

## 2023-11-05 MED ORDER — EPINEPHRINE HCL 5 MG/250ML IV SOLN IN NS
0.0000 ug/min | INTRAVENOUS | Status: DC
  Filled 2023-11-05: qty 250

## 2023-11-05 MED ORDER — POTASSIUM CHLORIDE 2 MEQ/ML IV SOLN
80.0000 meq | INTRAVENOUS | Status: DC
  Filled 2023-11-05: qty 40

## 2023-11-05 MED ORDER — MANNITOL 20 % IV SOLN
INTRAVENOUS | Status: DC
  Filled 2023-11-05: qty 13

## 2023-11-05 MED ORDER — MILRINONE LACTATE IN DEXTROSE 20-5 MG/100ML-% IV SOLN
0.3000 ug/kg/min | INTRAVENOUS | Status: DC
  Filled 2023-11-05: qty 100

## 2023-11-05 MED ORDER — INSULIN REGULAR(HUMAN) IN NACL 100-0.9 UT/100ML-% IV SOLN
INTRAVENOUS | Status: DC
  Filled 2023-11-05: qty 100

## 2023-11-05 MED ORDER — TRANEXAMIC ACID (OHS) PUMP PRIME SOLUTION
2.0000 mg/kg | INTRAVENOUS | Status: DC
  Filled 2023-11-05: qty 1.41

## 2023-11-05 MED ORDER — NITROGLYCERIN IN D5W 200-5 MCG/ML-% IV SOLN
2.0000 ug/min | INTRAVENOUS | Status: DC
  Filled 2023-11-05: qty 250

## 2023-11-05 MED ORDER — DEXMEDETOMIDINE HCL IN NACL 400 MCG/100ML IV SOLN
0.1000 ug/kg/h | INTRAVENOUS | Status: AC
  Administered 2023-11-08: .4 ug/kg/h via INTRAVENOUS
  Filled 2023-11-05: qty 100

## 2023-11-05 MED ORDER — CEFAZOLIN SODIUM-DEXTROSE 2-4 GM/100ML-% IV SOLN
2.0000 g | INTRAVENOUS | Status: AC
Start: 2023-11-08 — End: 2023-11-09
  Administered 2023-11-08 (×2): 2 g via INTRAVENOUS
  Filled 2023-11-05: qty 100

## 2023-11-05 MED ORDER — TRANEXAMIC ACID (OHS) BOLUS VIA INFUSION
15.0000 mg/kg | INTRAVENOUS | Status: AC
  Administered 2023-11-08: 1060.5 mg via INTRAVENOUS
  Filled 2023-11-05: qty 1061

## 2023-11-05 MED ORDER — VANCOMYCIN HCL 1250 MG/250ML IV SOLN
1250.0000 mg | INTRAVENOUS | Status: DC
  Filled 2023-11-05: qty 250

## 2023-11-05 MED ORDER — INSULIN REGULAR(HUMAN) IN NACL 100-0.9 UT/100ML-% IV SOLN
INTRAVENOUS | Status: AC
  Administered 2023-11-08: .8 [IU]/h via INTRAVENOUS
  Filled 2023-11-05: qty 100

## 2023-11-05 MED ORDER — PHENYLEPHRINE HCL-NACL 20-0.9 MG/250ML-% IV SOLN
30.0000 ug/min | INTRAVENOUS | Status: DC
  Filled 2023-11-05: qty 250

## 2023-11-08 ENCOUNTER — Other Ambulatory Visit: Payer: Self-pay

## 2023-11-08 ENCOUNTER — Encounter (HOSPITAL_COMMUNITY): Payer: Self-pay | Admitting: Thoracic Surgery (Cardiothoracic Vascular Surgery)

## 2023-11-08 ENCOUNTER — Inpatient Hospital Stay (HOSPITAL_COMMUNITY): Payer: 59

## 2023-11-08 ENCOUNTER — Encounter (HOSPITAL_COMMUNITY)
Admission: RE | Disposition: A | Discharge status: Home / Self Care | Payer: Self-pay | Source: Home / Self Care | Attending: Thoracic Surgery (Cardiothoracic Vascular Surgery)

## 2023-11-08 ENCOUNTER — Inpatient Hospital Stay (HOSPITAL_COMMUNITY): Payer: 59 | Admitting: Physician Assistant

## 2023-11-08 ENCOUNTER — Inpatient Hospital Stay (HOSPITAL_COMMUNITY)
Admission: RE | Admit: 2023-11-08 | Discharge: 2023-11-12 | DRG: 236 | Disposition: A | Discharge status: Home / Self Care | Payer: 59 | Attending: Thoracic Surgery (Cardiothoracic Vascular Surgery) | Admitting: Thoracic Surgery (Cardiothoracic Vascular Surgery)

## 2023-11-08 DIAGNOSIS — E785 Hyperlipidemia, unspecified: Secondary | ICD-10-CM | POA: Diagnosis present

## 2023-11-08 DIAGNOSIS — R112 Nausea with vomiting, unspecified: Secondary | ICD-10-CM | POA: Diagnosis not present

## 2023-11-08 DIAGNOSIS — I2510 Atherosclerotic heart disease of native coronary artery without angina pectoris: Secondary | ICD-10-CM | POA: Diagnosis present

## 2023-11-08 DIAGNOSIS — Z87891 Personal history of nicotine dependence: Secondary | ICD-10-CM | POA: Diagnosis not present

## 2023-11-08 DIAGNOSIS — E1151 Type 2 diabetes mellitus with diabetic peripheral angiopathy without gangrene: Secondary | ICD-10-CM | POA: Diagnosis present

## 2023-11-08 DIAGNOSIS — D72829 Elevated white blood cell count, unspecified: Secondary | ICD-10-CM | POA: Diagnosis present

## 2023-11-08 DIAGNOSIS — I959 Hypotension, unspecified: Secondary | ICD-10-CM | POA: Diagnosis not present

## 2023-11-08 DIAGNOSIS — I10 Essential (primary) hypertension: Secondary | ICD-10-CM

## 2023-11-08 DIAGNOSIS — I4891 Unspecified atrial fibrillation: Secondary | ICD-10-CM | POA: Diagnosis not present

## 2023-11-08 DIAGNOSIS — R339 Retention of urine, unspecified: Secondary | ICD-10-CM | POA: Diagnosis not present

## 2023-11-08 DIAGNOSIS — Z79899 Other long term (current) drug therapy: Secondary | ICD-10-CM

## 2023-11-08 DIAGNOSIS — E1165 Type 2 diabetes mellitus with hyperglycemia: Secondary | ICD-10-CM | POA: Diagnosis present

## 2023-11-08 DIAGNOSIS — Z7982 Long term (current) use of aspirin: Secondary | ICD-10-CM

## 2023-11-08 DIAGNOSIS — I25118 Atherosclerotic heart disease of native coronary artery with other forms of angina pectoris: Secondary | ICD-10-CM | POA: Diagnosis not present

## 2023-11-08 DIAGNOSIS — J90 Pleural effusion, not elsewhere classified: Secondary | ICD-10-CM | POA: Diagnosis not present

## 2023-11-08 DIAGNOSIS — D62 Acute posthemorrhagic anemia: Secondary | ICD-10-CM | POA: Diagnosis not present

## 2023-11-08 DIAGNOSIS — R739 Hyperglycemia, unspecified: Secondary | ICD-10-CM | POA: Diagnosis not present

## 2023-11-08 DIAGNOSIS — I472 Ventricular tachycardia, unspecified: Secondary | ICD-10-CM | POA: Diagnosis not present

## 2023-11-08 DIAGNOSIS — Z8249 Family history of ischemic heart disease and other diseases of the circulatory system: Secondary | ICD-10-CM

## 2023-11-08 DIAGNOSIS — F419 Anxiety disorder, unspecified: Secondary | ICD-10-CM | POA: Diagnosis present

## 2023-11-08 DIAGNOSIS — I25110 Atherosclerotic heart disease of native coronary artery with unstable angina pectoris: Secondary | ICD-10-CM | POA: Diagnosis present

## 2023-11-08 DIAGNOSIS — Z951 Presence of aortocoronary bypass graft: Principal | ICD-10-CM

## 2023-11-08 DIAGNOSIS — Z7902 Long term (current) use of antithrombotics/antiplatelets: Secondary | ICD-10-CM | POA: Diagnosis not present

## 2023-11-08 DIAGNOSIS — I493 Ventricular premature depolarization: Secondary | ICD-10-CM | POA: Diagnosis not present

## 2023-11-08 DIAGNOSIS — Z955 Presence of coronary angioplasty implant and graft: Secondary | ICD-10-CM | POA: Diagnosis not present

## 2023-11-08 DIAGNOSIS — I25119 Atherosclerotic heart disease of native coronary artery with unspecified angina pectoris: Secondary | ICD-10-CM

## 2023-11-08 HISTORY — PX: CORONARY ARTERY BYPASS GRAFT: SHX141

## 2023-11-08 HISTORY — PX: TEE WITHOUT CARDIOVERSION: SHX5443

## 2023-11-08 LAB — POCT I-STAT 7, (LYTES, BLD GAS, ICA,H+H)
Acid-Base Excess: 0 mmol/L (ref 0.0–2.0)
Acid-base deficit: 2 mmol/L (ref 0.0–2.0)
Acid-base deficit: 4 mmol/L — ABNORMAL HIGH (ref 0.0–2.0)
Acid-base deficit: 1 mmol/L (ref 0.0–2.0)
Bicarbonate: 22.6 mmol/L (ref 20.0–28.0)
Bicarbonate: 21.6 mmol/L (ref 20.0–28.0)
Bicarbonate: 23.9 mmol/L (ref 20.0–28.0)
Bicarbonate: 25.7 mmol/L (ref 20.0–28.0)
Calcium, Ion: 1.13 mmol/L — ABNORMAL LOW (ref 1.15–1.40)
Calcium, Ion: 0.97 mmol/L — ABNORMAL LOW (ref 1.15–1.40)
Calcium, Ion: 1.08 mmol/L — ABNORMAL LOW (ref 1.15–1.40)
Calcium, Ion: 1.29 mmol/L (ref 1.15–1.40)
HCT: 35 % — ABNORMAL LOW (ref 39.0–52.0)
HCT: 27 % — ABNORMAL LOW (ref 39.0–52.0)
HCT: 29 % — ABNORMAL LOW (ref 39.0–52.0)
HCT: 30 % — ABNORMAL LOW (ref 39.0–52.0)
Hemoglobin: 11.9 g/dL — ABNORMAL LOW (ref 13.0–17.0)
Hemoglobin: 9.2 g/dL — ABNORMAL LOW (ref 13.0–17.0)
Hemoglobin: 10.2 g/dL — ABNORMAL LOW (ref 13.0–17.0)
Hemoglobin: 9.9 g/dL — ABNORMAL LOW (ref 13.0–17.0)
O2 Saturation: 100 %
O2 Saturation: 100 %
O2 Saturation: 100 %
O2 Saturation: 100 %
Potassium: 2.8 mmol/L — ABNORMAL LOW (ref 3.5–5.1)
Potassium: 3.8 mmol/L (ref 3.5–5.1)
Potassium: 3.6 mmol/L (ref 3.5–5.1)
Potassium: 4.2 mmol/L (ref 3.5–5.1)
Sodium: 142 mmol/L (ref 135–145)
Sodium: 140 mmol/L (ref 135–145)
Sodium: 140 mmol/L (ref 135–145)
Sodium: 141 mmol/L (ref 135–145)
TCO2: 24 mmol/L (ref 22–32)
TCO2: 23 mmol/L (ref 22–32)
TCO2: 25 mmol/L (ref 22–32)
TCO2: 27 mmol/L (ref 22–32)
pCO2 arterial: 36.4 mm[Hg] (ref 32–48)
pCO2 arterial: 38.3 mm[Hg] (ref 32–48)
pCO2 arterial: 39.9 mm[Hg] (ref 32–48)
pCO2 arterial: 44.8 mm[Hg] (ref 32–48)
pH, Arterial: 7.401 (ref 7.35–7.45)
pH, Arterial: 7.358 (ref 7.35–7.45)
pH, Arterial: 7.367 (ref 7.35–7.45)
pH, Arterial: 7.385 (ref 7.35–7.45)
pO2, Arterial: 250 mm[Hg] — ABNORMAL HIGH (ref 83–108)
pO2, Arterial: 250 mm[Hg] — ABNORMAL HIGH (ref 83–108)
pO2, Arterial: 180 mm[Hg] — ABNORMAL HIGH (ref 83–108)
pO2, Arterial: 215 mm[Hg] — ABNORMAL HIGH (ref 83–108)

## 2023-11-08 LAB — POCT I-STAT EG7
Acid-base deficit: 3 mmol/L — ABNORMAL HIGH (ref 0.0–2.0)
Bicarbonate: 22.1 mmol/L (ref 20.0–28.0)
Calcium, Ion: 1.02 mmol/L — ABNORMAL LOW (ref 1.15–1.40)
HCT: 33 % — ABNORMAL LOW (ref 39.0–52.0)
Hemoglobin: 11.2 g/dL — ABNORMAL LOW (ref 13.0–17.0)
O2 Saturation: 82 %
Potassium: 3.6 mmol/L (ref 3.5–5.1)
Sodium: 142 mmol/L (ref 135–145)
TCO2: 23 mmol/L (ref 22–32)
pCO2, Ven: 38.4 mm[Hg] — ABNORMAL LOW (ref 44–60)
pH, Ven: 7.367 (ref 7.25–7.43)
pO2, Ven: 47 mm[Hg] — ABNORMAL HIGH (ref 32–45)

## 2023-11-08 LAB — POCT I-STAT, CHEM 8
BUN: 7 mg/dL (ref 6–20)
BUN: 7 mg/dL (ref 6–20)
BUN: 6 mg/dL (ref 6–20)
BUN: 6 mg/dL (ref 6–20)
Calcium, Ion: 1.12 mmol/L — ABNORMAL LOW (ref 1.15–1.40)
Calcium, Ion: 1.15 mmol/L (ref 1.15–1.40)
Calcium, Ion: 1.05 mmol/L — ABNORMAL LOW (ref 1.15–1.40)
Calcium, Ion: 1.25 mmol/L (ref 1.15–1.40)
Chloride: 104 mmol/L (ref 98–111)
Chloride: 104 mmol/L (ref 98–111)
Chloride: 104 mmol/L (ref 98–111)
Chloride: 104 mmol/L (ref 98–111)
Creatinine, Ser: 0.6 mg/dL — ABNORMAL LOW (ref 0.61–1.24)
Creatinine, Ser: 0.6 mg/dL — ABNORMAL LOW (ref 0.61–1.24)
Creatinine, Ser: 0.7 mg/dL (ref 0.61–1.24)
Creatinine, Ser: 0.6 mg/dL — ABNORMAL LOW (ref 0.61–1.24)
Glucose, Bld: 101 mg/dL — ABNORMAL HIGH (ref 70–99)
Glucose, Bld: 110 mg/dL — ABNORMAL HIGH (ref 70–99)
Glucose, Bld: 103 mg/dL — ABNORMAL HIGH (ref 70–99)
Glucose, Bld: 142 mg/dL — ABNORMAL HIGH (ref 70–99)
HCT: 38 % — ABNORMAL LOW (ref 39.0–52.0)
HCT: 37 % — ABNORMAL LOW (ref 39.0–52.0)
HCT: 31 % — ABNORMAL LOW (ref 39.0–52.0)
HCT: 29 % — ABNORMAL LOW (ref 39.0–52.0)
Hemoglobin: 12.9 g/dL — ABNORMAL LOW (ref 13.0–17.0)
Hemoglobin: 12.6 g/dL — ABNORMAL LOW (ref 13.0–17.0)
Hemoglobin: 10.5 g/dL — ABNORMAL LOW (ref 13.0–17.0)
Hemoglobin: 9.9 g/dL — ABNORMAL LOW (ref 13.0–17.0)
Potassium: 2.8 mmol/L — ABNORMAL LOW (ref 3.5–5.1)
Potassium: 3.1 mmol/L — ABNORMAL LOW (ref 3.5–5.1)
Potassium: 3.9 mmol/L (ref 3.5–5.1)
Potassium: 3.6 mmol/L (ref 3.5–5.1)
Sodium: 141 mmol/L (ref 135–145)
Sodium: 139 mmol/L (ref 135–145)
Sodium: 140 mmol/L (ref 135–145)
Sodium: 140 mmol/L (ref 135–145)
TCO2: 22 mmol/L (ref 22–32)
TCO2: 24 mmol/L (ref 22–32)
TCO2: 25 mmol/L (ref 22–32)
TCO2: 24 mmol/L (ref 22–32)

## 2023-11-08 LAB — BASIC METABOLIC PANEL
Anion gap: 6 (ref 5–15)
BUN: 7 mg/dL (ref 6–20)
CO2: 22 mmol/L (ref 22–32)
Calcium: 7.8 mg/dL — ABNORMAL LOW (ref 8.9–10.3)
Chloride: 111 mmol/L (ref 98–111)
Creatinine, Ser: 0.92 mg/dL (ref 0.61–1.24)
GFR, Estimated: >60 mL/min (ref >60)
Glucose, Bld: 128 mg/dL — ABNORMAL HIGH (ref 70–99)
Potassium: 4.3 mmol/L (ref 3.5–5.1)
Sodium: 139 mmol/L (ref 135–145)

## 2023-11-08 LAB — GLUCOSE, CAPILLARY
Glucose-Capillary: 89 mg/dL (ref 70–99)
Glucose-Capillary: 126 mg/dL — ABNORMAL HIGH (ref 70–99)
Glucose-Capillary: 117 mg/dL — ABNORMAL HIGH (ref 70–99)
Glucose-Capillary: 122 mg/dL — ABNORMAL HIGH (ref 70–99)
Glucose-Capillary: 132 mg/dL — ABNORMAL HIGH (ref 70–99)
Glucose-Capillary: 118 mg/dL — ABNORMAL HIGH (ref 70–99)
Glucose-Capillary: 190 mg/dL — ABNORMAL HIGH (ref 70–99)

## 2023-11-08 LAB — PROTIME-INR
INR: 1.4 — ABNORMAL HIGH (ref 0.8–1.2)
Prothrombin Time: 17.1 s — ABNORMAL HIGH (ref 11.4–15.2)

## 2023-11-08 LAB — CBC
HCT: 32.8 % — ABNORMAL LOW (ref 39.0–52.0)
HCT: 30.3 % — ABNORMAL LOW (ref 39.0–52.0)
Hemoglobin: 11.9 g/dL — ABNORMAL LOW (ref 13.0–17.0)
Hemoglobin: 10.6 g/dL — ABNORMAL LOW (ref 13.0–17.0)
MCH: 32.2 pg (ref 26.0–34.0)
MCH: 31.5 pg (ref 26.0–34.0)
MCHC: 36.3 g/dL — ABNORMAL HIGH (ref 30.0–36.0)
MCHC: 35 g/dL (ref 30.0–36.0)
MCV: 88.9 fL (ref 80.0–100.0)
MCV: 90.2 fL (ref 80.0–100.0)
Platelets: 149 10*3/uL — ABNORMAL LOW (ref 150–400)
Platelets: 136 10*3/uL — ABNORMAL LOW (ref 150–400)
RBC: 3.69 MIL/uL — ABNORMAL LOW (ref 4.22–5.81)
RBC: 3.36 MIL/uL — ABNORMAL LOW (ref 4.22–5.81)
RDW: 12.6 % (ref 11.5–15.5)
RDW: 12.6 % (ref 11.5–15.5)
WBC: 9.8 10*3/uL (ref 4.0–10.5)
WBC: 8.5 10*3/uL (ref 4.0–10.5)
nRBC: 0 % (ref 0.0–0.2)
nRBC: 0 % (ref 0.0–0.2)

## 2023-11-08 LAB — PLATELET COUNT: Platelets: 195 10*3/uL (ref 150–400)

## 2023-11-08 LAB — HEMOGLOBIN AND HEMATOCRIT, BLOOD
HCT: 29.7 % — ABNORMAL LOW (ref 39.0–52.0)
Hemoglobin: 10.8 g/dL — ABNORMAL LOW (ref 13.0–17.0)

## 2023-11-08 LAB — ABO/RH: ABO/RH(D): O POS

## 2023-11-08 LAB — APTT: aPTT: 34 s (ref 24–36)

## 2023-11-08 LAB — MAGNESIUM: Magnesium: 2.6 mg/dL — ABNORMAL HIGH (ref 1.7–2.4)

## 2023-11-08 SURGERY — CORONARY ARTERY BYPASS GRAFTING (CABG)
Anesthesia: General | Site: Chest

## 2023-11-08 MED ORDER — 0.9 % SODIUM CHLORIDE (POUR BTL) OPTIME
TOPICAL | Status: DC | PRN
  Administered 2023-11-08: 5000 mL

## 2023-11-08 MED ORDER — MIDAZOLAM HCL (PF) 10 MG/2ML IJ SOLN
INTRAMUSCULAR | Status: AC
  Filled 2023-11-08: qty 2

## 2023-11-08 MED ORDER — PANTOPRAZOLE SODIUM 40 MG PO TBEC
40.0000 mg | DELAYED_RELEASE_TABLET | Freq: Every day | ORAL | Status: DC
  Administered 2023-11-10 – 2023-11-12 (×3): 40 mg via ORAL
  Filled 2023-11-08 (×3): qty 1

## 2023-11-08 MED ORDER — NITROGLYCERIN IN D5W 200-5 MCG/ML-% IV SOLN
0.0000 ug/min | INTRAVENOUS | Status: DC
  Administered 2023-11-08: 5 ug/min via INTRAVENOUS

## 2023-11-08 MED ORDER — LACTATED RINGERS IV SOLN: INTRAVENOUS | Status: DC

## 2023-11-08 MED ORDER — ONDANSETRON HCL 4 MG/2ML IJ SOLN
4.0000 mg | Freq: Four times a day (QID) | INTRAMUSCULAR | Status: DC | PRN
  Administered 2023-11-08 – 2023-11-10 (×5): 4 mg via INTRAVENOUS
  Filled 2023-11-08 (×4): qty 2

## 2023-11-08 MED ORDER — LIDOCAINE 2% (20 MG/ML) 5 ML SYRINGE
INTRAMUSCULAR | Status: AC
  Filled 2023-11-08: qty 5

## 2023-11-08 MED ORDER — LACTATED RINGERS IV SOLN: INTRAVENOUS | Status: DC | PRN

## 2023-11-08 MED ORDER — PANTOPRAZOLE SODIUM 40 MG IV SOLR
40.0000 mg | Freq: Every day | INTRAVENOUS | Status: AC
  Administered 2023-11-08 – 2023-11-09 (×2): 40 mg via INTRAVENOUS
  Filled 2023-11-08 (×2): qty 10

## 2023-11-08 MED ORDER — FENTANYL CITRATE (PF) 250 MCG/5ML IJ SOLN
INTRAMUSCULAR | Status: AC
Start: 2023-11-08 — End: ?
  Filled 2023-11-08: qty 5

## 2023-11-08 MED ORDER — SODIUM CHLORIDE 0.9% FLUSH: 3.0000 mL | INTRAVENOUS | Status: DC | PRN

## 2023-11-08 MED ORDER — SODIUM CHLORIDE 0.9 % IV SOLN: INTRAVENOUS | Status: DC | PRN

## 2023-11-08 MED ORDER — PROPOFOL 10 MG/ML IV BOLUS
INTRAVENOUS | Status: AC
  Filled 2023-11-08: qty 20

## 2023-11-08 MED ORDER — DEXMEDETOMIDINE HCL IN NACL 400 MCG/100ML IV SOLN: 0.0000 ug/kg/h | INTRAVENOUS | Status: DC

## 2023-11-08 MED ORDER — ASPIRIN 81 MG PO CHEW
324.0000 mg | CHEWABLE_TABLET | Freq: Once | ORAL | Status: AC
  Administered 2023-11-08: 324 mg via ORAL

## 2023-11-08 MED ORDER — NOREPINEPHRINE 4 MG/250ML-% IV SOLN: 0.0000 ug/min | INTRAVENOUS | Status: DC

## 2023-11-08 MED ORDER — DOCUSATE SODIUM 100 MG PO CAPS
200.0000 mg | ORAL_CAPSULE | Freq: Every day | ORAL | Status: DC
  Administered 2023-11-09 – 2023-11-12 (×4): 200 mg via ORAL
  Filled 2023-11-08 (×4): qty 2

## 2023-11-08 MED ORDER — ROCURONIUM BROMIDE 10 MG/ML (PF) SYRINGE
PREFILLED_SYRINGE | INTRAVENOUS | Status: AC
  Filled 2023-11-08: qty 10

## 2023-11-08 MED ORDER — ASPIRIN 81 MG PO CHEW
324.0000 mg | CHEWABLE_TABLET | Freq: Every day | ORAL | Status: DC
  Filled 2023-11-08: qty 4

## 2023-11-08 MED ORDER — PRAVASTATIN SODIUM 40 MG PO TABS
20.0000 mg | ORAL_TABLET | Freq: Every day | ORAL | Status: DC
Start: 2023-11-09 — End: 2023-11-12
  Administered 2023-11-09 – 2023-11-11 (×2): 20 mg via ORAL
  Filled 2023-11-08: qty 1
  Filled 2023-11-08 (×2): qty 2
  Filled 2023-11-08: qty 1

## 2023-11-08 MED ORDER — FENTANYL CITRATE (PF) 250 MCG/5ML IJ SOLN
INTRAMUSCULAR | Status: DC | PRN
Start: 2023-11-08 — End: 2023-11-08
  Administered 2023-11-08: 50 ug via INTRAVENOUS
  Administered 2023-11-08: 350 ug via INTRAVENOUS
  Administered 2023-11-08: 150 ug via INTRAVENOUS
  Administered 2023-11-08: 50 ug via INTRAVENOUS
  Administered 2023-11-08 (×2): 200 ug via INTRAVENOUS

## 2023-11-08 MED ORDER — MORPHINE SULFATE (PF) 2 MG/ML IV SOLN
1.0000 mg | INTRAVENOUS | Status: DC | PRN
  Administered 2023-11-08 (×2): 4 mg via INTRAVENOUS
  Administered 2023-11-08: 2 mg via INTRAVENOUS
  Administered 2023-11-09 (×2): 4 mg via INTRAVENOUS
  Administered 2023-11-09: 2 mg via INTRAVENOUS
  Filled 2023-11-08: qty 1
  Filled 2023-11-08: qty 2
  Filled 2023-11-08: qty 1
  Filled 2023-11-08 (×3): qty 2

## 2023-11-08 MED ORDER — CHLORHEXIDINE GLUCONATE 0.12 % MT SOLN: 15.0000 mL | OROMUCOSAL | Status: DC

## 2023-11-08 MED ORDER — PHENYLEPHRINE 80 MCG/ML (10ML) SYRINGE FOR IV PUSH (FOR BLOOD PRESSURE SUPPORT)
PREFILLED_SYRINGE | INTRAVENOUS | Status: DC | PRN
  Administered 2023-11-08: 160 ug via INTRAVENOUS
  Administered 2023-11-08: 80 ug via INTRAVENOUS

## 2023-11-08 MED ORDER — LORATADINE 10 MG PO TABS
10.0000 mg | ORAL_TABLET | Freq: Every day | ORAL | Status: DC
  Administered 2023-11-09 – 2023-11-12 (×4): 10 mg via ORAL
  Filled 2023-11-08 (×4): qty 1

## 2023-11-08 MED ORDER — CEFAZOLIN SODIUM-DEXTROSE 2-4 GM/100ML-% IV SOLN
2.0000 g | Freq: Three times a day (TID) | INTRAVENOUS | Status: AC
  Administered 2023-11-08 – 2023-11-10 (×6): 2 g via INTRAVENOUS
  Filled 2023-11-08 (×6): qty 100

## 2023-11-08 MED ORDER — METOPROLOL TARTRATE 25 MG/10 ML ORAL SUSPENSION: 12.5000 mg | Freq: Two times a day (BID) | ORAL | Status: DC

## 2023-11-08 MED ORDER — ALBUMIN HUMAN 5 % IV SOLN
250.0000 mL | INTRAVENOUS | Status: AC | PRN
  Administered 2023-11-08 (×5): 12.5 g via INTRAVENOUS
  Filled 2023-11-08 (×3): qty 250

## 2023-11-08 MED ORDER — DEXTROSE 50 % IV SOLN: 0.0000 mL | INTRAVENOUS | Status: DC | PRN

## 2023-11-08 MED ORDER — FENTANYL CITRATE (PF) 250 MCG/5ML IJ SOLN
INTRAMUSCULAR | Status: AC
  Filled 2023-11-08: qty 5

## 2023-11-08 MED ORDER — POTASSIUM CHLORIDE 10 MEQ/50ML IV SOLN
10.0000 meq | INTRAVENOUS | Status: AC
  Administered 2023-11-08 (×3): 10 meq via INTRAVENOUS
  Filled 2023-11-08: qty 50

## 2023-11-08 MED ORDER — MAGNESIUM SULFATE 4 GM/100ML IV SOLN
4.0000 g | Freq: Once | INTRAVENOUS | Status: AC
  Administered 2023-11-08: 4 g via INTRAVENOUS
  Filled 2023-11-08: qty 100

## 2023-11-08 MED ORDER — ROCURONIUM BROMIDE 10 MG/ML (PF) SYRINGE
PREFILLED_SYRINGE | INTRAVENOUS | Status: DC | PRN
Start: 2023-11-08 — End: 2023-11-08
  Administered 2023-11-08 (×2): 40 mg via INTRAVENOUS
  Administered 2023-11-08: 20 mg via INTRAVENOUS
  Administered 2023-11-08: 40 mg via INTRAVENOUS
  Administered 2023-11-08: 60 mg via INTRAVENOUS

## 2023-11-08 MED ORDER — ASPIRIN 325 MG PO TBEC
325.0000 mg | DELAYED_RELEASE_TABLET | Freq: Every day | ORAL | Status: DC
  Administered 2023-11-09: 325 mg via ORAL
  Filled 2023-11-08: qty 1

## 2023-11-08 MED ORDER — CHLORHEXIDINE GLUCONATE 0.12 % MT SOLN
15.0000 mL | Freq: Once | OROMUCOSAL | Status: AC
  Administered 2023-11-08: 15 mL via OROMUCOSAL
  Filled 2023-11-08: qty 15

## 2023-11-08 MED ORDER — MIDAZOLAM HCL 2 MG/2ML IJ SOLN
INTRAMUSCULAR | Status: DC
Start: 2023-11-08 — End: 2023-11-08
  Filled 2023-11-08: qty 2

## 2023-11-08 MED ORDER — ALBUMIN HUMAN 5 % IV SOLN: INTRAVENOUS | Status: DC | PRN

## 2023-11-08 MED ORDER — BISACODYL 5 MG PO TBEC
10.0000 mg | DELAYED_RELEASE_TABLET | Freq: Every day | ORAL | Status: DC
  Administered 2023-11-09 – 2023-11-12 (×4): 10 mg via ORAL
  Filled 2023-11-08 (×4): qty 2

## 2023-11-08 MED ORDER — METOPROLOL TARTRATE 12.5 MG HALF TABLET: 12.5000 mg | ORAL_TABLET | Freq: Once | ORAL | Status: DC

## 2023-11-08 MED ORDER — OXYCODONE HCL 5 MG PO TABS
5.0000 mg | ORAL_TABLET | ORAL | Status: DC | PRN
Start: 2023-11-08 — End: 2023-11-12
  Administered 2023-11-08 – 2023-11-09 (×6): 10 mg via ORAL
  Filled 2023-11-08 (×7): qty 2

## 2023-11-08 MED ORDER — LACTATED RINGERS IV SOLN: INTRAVENOUS | Status: AC

## 2023-11-08 MED ORDER — SODIUM CHLORIDE (PF) 0.9 % IJ SOLN: OROMUCOSAL | Status: DC | PRN

## 2023-11-08 MED ORDER — VANCOMYCIN HCL IN DEXTROSE 1-5 GM/200ML-% IV SOLN
1000.0000 mg | Freq: Once | INTRAVENOUS | Status: AC
  Administered 2023-11-08: 1000 mg via INTRAVENOUS
  Filled 2023-11-08: qty 200

## 2023-11-08 MED ORDER — MIDAZOLAM HCL 2 MG/2ML IJ SOLN: 2.0000 mg | INTRAMUSCULAR | Status: DC | PRN

## 2023-11-08 MED ORDER — TRAMADOL HCL 50 MG PO TABS
50.0000 mg | ORAL_TABLET | ORAL | Status: DC | PRN
  Administered 2023-11-09 (×4): 100 mg via ORAL
  Filled 2023-11-08 (×4): qty 2

## 2023-11-08 MED ORDER — METOCLOPRAMIDE HCL 5 MG/ML IJ SOLN
10.0000 mg | Freq: Four times a day (QID) | INTRAMUSCULAR | Status: AC
  Administered 2023-11-08 – 2023-11-09 (×5): 10 mg via INTRAVENOUS
  Filled 2023-11-08 (×5): qty 2

## 2023-11-08 MED ORDER — ACETAMINOPHEN 160 MG/5ML PO SOLN: 1000.0000 mg | Freq: Four times a day (QID) | ORAL | Status: DC

## 2023-11-08 MED ORDER — MIDAZOLAM HCL (PF) 5 MG/ML IJ SOLN
INTRAMUSCULAR | Status: DC | PRN
  Administered 2023-11-08: 1 mg via INTRAVENOUS
  Administered 2023-11-08: 2 mg via INTRAVENOUS
  Administered 2023-11-08: 2.5 mg via INTRAVENOUS
  Administered 2023-11-08: 2 mg via INTRAVENOUS
  Administered 2023-11-08: 2.5 mg via INTRAVENOUS

## 2023-11-08 MED ORDER — ONDANSETRON HCL 4 MG/2ML IJ SOLN
INTRAMUSCULAR | Status: AC
  Filled 2023-11-08: qty 2

## 2023-11-08 MED ORDER — SODIUM CHLORIDE 0.9% FLUSH
3.0000 mL | Freq: Two times a day (BID) | INTRAVENOUS | Status: DC
  Administered 2023-11-09 – 2023-11-12 (×7): 3 mL via INTRAVENOUS

## 2023-11-08 MED ORDER — PLASMA-LYTE A IV SOLN: INTRAVENOUS | Status: DC | PRN

## 2023-11-08 MED ORDER — POTASSIUM CHLORIDE 20 MEQ PO PACK
60.0000 meq | PACK | Freq: Once | ORAL | Status: AC
  Administered 2023-11-08: 60 meq
  Filled 2023-11-08: qty 3

## 2023-11-08 MED ORDER — ~~LOC~~ CARDIAC SURGERY, PATIENT & FAMILY EDUCATION
Freq: Once | Status: DC
Start: 2023-11-08 — End: 2023-11-08
  Filled 2023-11-08: qty 1

## 2023-11-08 MED ORDER — ORAL CARE MOUTH RINSE: 15.0000 mL | OROMUCOSAL | Status: DC | PRN

## 2023-11-08 MED ORDER — ACETAMINOPHEN 500 MG PO TABS
1000.0000 mg | ORAL_TABLET | Freq: Four times a day (QID) | ORAL | Status: DC
  Administered 2023-11-09 – 2023-11-12 (×10): 1000 mg via ORAL
  Filled 2023-11-08 (×11): qty 2

## 2023-11-08 MED ORDER — METOPROLOL TARTRATE 12.5 MG HALF TABLET
12.5000 mg | ORAL_TABLET | Freq: Two times a day (BID) | ORAL | Status: DC
  Administered 2023-11-09 (×2): 12.5 mg via ORAL
  Filled 2023-11-08 (×2): qty 1

## 2023-11-08 MED ORDER — NICARDIPINE HCL IN NACL 20-0.86 MG/200ML-% IV SOLN: 0.0000 mg/h | INTRAVENOUS | Status: DC

## 2023-11-08 MED ORDER — EPINEPHRINE HCL 5 MG/250ML IV SOLN IN NS: 0.0000 ug/min | INTRAVENOUS | Status: DC

## 2023-11-08 MED ORDER — SODIUM CHLORIDE 0.45 % IV SOLN: INTRAVENOUS | Status: AC | PRN

## 2023-11-08 MED ORDER — METOPROLOL TARTRATE 5 MG/5ML IV SOLN
2.5000 mg | INTRAVENOUS | Status: DC | PRN
  Administered 2023-11-09 – 2023-11-10 (×2): 2.5 mg via INTRAVENOUS
  Filled 2023-11-08 (×2): qty 5

## 2023-11-08 MED ORDER — PROPOFOL 10 MG/ML IV BOLUS
INTRAVENOUS | Status: DC | PRN
  Administered 2023-11-08: 80 mg via INTRAVENOUS
  Administered 2023-11-08: 50 mg via INTRAVENOUS

## 2023-11-08 MED ORDER — SODIUM CHLORIDE 0.9 % IV SOLN: INTRAVENOUS | Status: AC

## 2023-11-08 MED ORDER — HEPARIN SODIUM (PORCINE) 1000 UNIT/ML IJ SOLN
INTRAMUSCULAR | Status: DC | PRN
  Administered 2023-11-08: 25000 [IU] via INTRAVENOUS

## 2023-11-08 MED ORDER — CHLORHEXIDINE GLUCONATE 0.12 % MT SOLN
15.0000 mL | OROMUCOSAL | Status: AC
  Administered 2023-11-08: 15 mL via OROMUCOSAL
  Filled 2023-11-08: qty 15

## 2023-11-08 MED ORDER — PROTAMINE SULFATE 10 MG/ML IV SOLN
INTRAVENOUS | Status: DC | PRN
  Administered 2023-11-08: 105 mg via INTRAVENOUS
  Administered 2023-11-08: 20 mg via INTRAVENOUS

## 2023-11-08 MED ORDER — CHLORHEXIDINE GLUCONATE 4 % EX SOLN
30.0000 mL | CUTANEOUS | Status: DC
Start: 2023-11-08 — End: 2023-11-08

## 2023-11-08 MED ORDER — ACETAMINOPHEN 160 MG/5ML PO SOLN
650.0000 mg | Freq: Once | ORAL | Status: AC
  Administered 2023-11-08: 650 mg
  Filled 2023-11-08: qty 20.3

## 2023-11-08 MED ORDER — FENTANYL CITRATE (PF) 100 MCG/2ML IJ SOLN
INTRAMUSCULAR | Status: AC
  Filled 2023-11-08: qty 2

## 2023-11-08 MED ORDER — INSULIN REGULAR(HUMAN) IN NACL 100-0.9 UT/100ML-% IV SOLN: INTRAVENOUS | Status: DC

## 2023-11-08 MED ORDER — CHLORHEXIDINE GLUCONATE CLOTH 2 % EX PADS
6.0000 | MEDICATED_PAD | Freq: Every day | CUTANEOUS | Status: DC
  Administered 2023-11-09 – 2023-11-10 (×2): 6 via TOPICAL

## 2023-11-08 MED ORDER — SODIUM CHLORIDE 0.9 % IV SOLN: 250.0000 mL | INTRAVENOUS | Status: AC

## 2023-11-08 MED ORDER — BISACODYL 10 MG RE SUPP: 10.0000 mg | Freq: Every day | RECTAL | Status: DC

## 2023-11-08 SURGICAL SUPPLY — 80 items
BAG DECANTER FOR FLEXI CONT (MISCELLANEOUS) ×2 IMPLANT
BLADE CLIPPER SURG (BLADE) ×2 IMPLANT
BLADE STERNUM SYSTEM 6 (BLADE) ×2 IMPLANT
BNDG ELASTIC 4INX 5YD STR LF (GAUZE/BANDAGES/DRESSINGS) IMPLANT
BNDG ELASTIC 4X5.8 VLCR STR LF (GAUZE/BANDAGES/DRESSINGS) ×2 IMPLANT
BNDG ELASTIC 6INX 5YD STR LF (GAUZE/BANDAGES/DRESSINGS) IMPLANT
BNDG ELASTIC 6X5.8 VLCR STR LF (GAUZE/BANDAGES/DRESSINGS) ×2 IMPLANT
BNDG GAUZE DERMACEA FLUFF 4 (GAUZE/BANDAGES/DRESSINGS) ×2 IMPLANT
CABLE SURGICAL S-101-97-12 (CABLE) ×2 IMPLANT
CANISTER SUCT 3000ML PPV (MISCELLANEOUS) ×2 IMPLANT
CANNULA AORTIC ROOT 9FR (CANNULA) IMPLANT
CANNULA MC2 2 STG 29/37 NON-V (CANNULA) ×2 IMPLANT
CANNULA NON VENT 20FR 12 (CANNULA) ×2 IMPLANT
CANNULA VESSEL 3MM BLUNT TIP (CANNULA) IMPLANT
CATH ROBINSON RED A/P 18FR (CATHETERS) ×4 IMPLANT
CLIP RETRACTION 3.0MM CORONARY (MISCELLANEOUS) IMPLANT
CLIP TI MEDIUM 24 (CLIP) IMPLANT
CLIP TI WIDE RED SMALL 24 (CLIP) IMPLANT
CONN ST 1/2X1/2 BEN (MISCELLANEOUS) ×2 IMPLANT
CONNECTOR BLAKE 2:1 CARIO BLK (MISCELLANEOUS) ×2 IMPLANT
CONTAINER PROTECT SURGISLUSH (MISCELLANEOUS) ×4 IMPLANT
DERMABOND ADVANCED .7 DNX12 (GAUZE/BANDAGES/DRESSINGS) IMPLANT
DRAIN CHANNEL 19F RND (DRAIN) ×6 IMPLANT
DRAIN CONNECTOR BLAKE 1:1 (MISCELLANEOUS) ×2 IMPLANT
DRAPE INCISE IOBAN 66X45 STRL (DRAPES) IMPLANT
DRAPE SRG 135X102X78XABS (DRAPES) ×2 IMPLANT
DRAPE WARM FLUID 44X44 (DRAPES) ×2 IMPLANT
DRSG AQUACEL AG ADV 3.5X10 (GAUZE/BANDAGES/DRESSINGS) ×2 IMPLANT
ELECT BLADE 4.0 EZ CLEAN MEGAD (MISCELLANEOUS) ×2 IMPLANT
ELECT REM PT RETURN 9FT ADLT (ELECTROSURGICAL) ×4 IMPLANT
ELECTRODE BLDE 4.0 EZ CLN MEGD (MISCELLANEOUS) ×2 IMPLANT
ELECTRODE REM PT RTRN 9FT ADLT (ELECTROSURGICAL) ×4 IMPLANT
FELT TEFLON 1X6 (MISCELLANEOUS) ×4 IMPLANT
GAUZE 4X4 16PLY ~~LOC~~+RFID DBL (SPONGE) ×2 IMPLANT
GAUZE SPONGE 4X4 12PLY STRL (GAUZE/BANDAGES/DRESSINGS) ×4 IMPLANT
GAUZE SPONGE 4X4 12PLY STRL LF (GAUZE/BANDAGES/DRESSINGS) IMPLANT
GLOVE BIO SURGEON STRL SZ 6.5 (GLOVE) IMPLANT
GLOVE BIO SURGEON STRL SZ7 (GLOVE) ×4 IMPLANT
GLOVE BIOGEL M STRL SZ7.5 (GLOVE) ×4 IMPLANT
GLOVE BIOGEL PI IND STRL 6.5 (GLOVE) IMPLANT
GOWN STRL REUS W/ TWL LRG LVL3 (GOWN DISPOSABLE) ×8 IMPLANT
GOWN STRL REUS W/ TWL XL LVL3 (GOWN DISPOSABLE) ×4 IMPLANT
HEMOSTAT POWDER SURGIFOAM 1G (HEMOSTASIS) ×4 IMPLANT
INSERT SUTURE HOLDER (MISCELLANEOUS) ×2 IMPLANT
KIT BASIN OR (CUSTOM PROCEDURE TRAY) ×2 IMPLANT
KIT SUCTION CATH 14FR (SUCTIONS) ×2 IMPLANT
KIT TURNOVER KIT B (KITS) ×2 IMPLANT
KIT VASOVIEW HEMOPRO 2 VH 4000 (KITS) ×2 IMPLANT
LEAD PACING MYOCARDI (MISCELLANEOUS) ×2 IMPLANT
MARKER GRAFT CORONARY BYPASS (MISCELLANEOUS) ×6 IMPLANT
NS IRRIG 1000ML POUR BTL (IV SOLUTION) ×10 IMPLANT
PACK E OPEN HEART (SUTURE) ×2 IMPLANT
PACK OPEN HEART (CUSTOM PROCEDURE TRAY) ×2 IMPLANT
PAD ARMBOARD 7.5X6 YLW CONV (MISCELLANEOUS) ×4 IMPLANT
PAD ELECT DEFIB RADIOL ZOLL (MISCELLANEOUS) ×2 IMPLANT
PENCIL BUTTON HOLSTER BLD 10FT (ELECTRODE) ×2 IMPLANT
POSITIONER HEAD DONUT 9IN (MISCELLANEOUS) ×2 IMPLANT
PUNCH AORTIC ROTATE 4.0MM (MISCELLANEOUS) ×2 IMPLANT
SET MPS 3-ND DEL (MISCELLANEOUS) IMPLANT
SPONGE T-LAP 18X18 ~~LOC~~+RFID (SPONGE) ×8 IMPLANT
STOPCOCK 4 WAY LG BORE MALE ST (IV SETS) IMPLANT
SUPPORT HEART JANKE-BARRON (MISCELLANEOUS) ×2 IMPLANT
SUT BONE WAX W31G (SUTURE) ×2 IMPLANT
SUT ETHIBOND X763 2 0 SH 1 (SUTURE) ×4 IMPLANT
SUT MNCRL AB 3-0 PS2 18 (SUTURE) ×4 IMPLANT
SUT PDS AB 1 CTX 36 (SUTURE) ×4 IMPLANT
SUT PROLENE 4 0 SH DA (SUTURE) ×2 IMPLANT
SUT PROLENE 5 0 C 1 36 (SUTURE) ×6 IMPLANT
SUT PROLENE 7 0 BV 1 (SUTURE) IMPLANT
SUT PROLENE 7 0 BV1 MDA (SUTURE) ×2 IMPLANT
SUT STEEL 6MS V (SUTURE) ×4 IMPLANT
SYSTEM SAHARA CHEST DRAIN ATS (WOUND CARE) ×2 IMPLANT
TAPE CLOTH SURG 4X10 WHT LF (GAUZE/BANDAGES/DRESSINGS) IMPLANT
TAPE PAPER 2X10 WHT MICROPORE (GAUZE/BANDAGES/DRESSINGS) IMPLANT
TOWEL GREEN STERILE (TOWEL DISPOSABLE) ×2 IMPLANT
TOWEL GREEN STERILE FF (TOWEL DISPOSABLE) ×2 IMPLANT
TRAY FOLEY SLVR 16FR TEMP STAT (SET/KITS/TRAYS/PACK) ×2 IMPLANT
TUBING LAP HI FLOW INSUFFLATIO (TUBING) ×2 IMPLANT
UNDERPAD 30X36 HEAVY ABSORB (UNDERPADS AND DIAPERS) ×2 IMPLANT
WATER STERILE IRR 1000ML POUR (IV SOLUTION) ×4 IMPLANT

## 2023-11-08 NOTE — Consult Note (Signed)
 NAME:  [name], MRN:  [id number], DOB:  1965/12/18, LOS: 0 ADMISSION DATE:  11/08/2023, CONSULTATION DATE:  11/08/23 REFERRING MD:  Dr. Cliffton Asters CHIEF COMPLAINT:  CAD with unstable angina   History of Present Illness:  [name] is a 58 year old male who has been followed by [facility] cardiology for his CAD for some time (at least since 2018). He has had several caths with PCI to the prox and mid RCA in 2011, in-stent restenosis of the mid RCA in 2018, and prox left circ in 2020.   He was asymptomatic until about 12/2022 when be began experiencing intermittent chest pain. This progressed until 10/26/2023 when he was experiencing left sided exertional chest pain  that relieves with rest and rest pain about once per week. Cardiac cath 2/5 showed 3 vessel disease with 80% prox LAD, 80% OM1, and occluded RCA stents. Echo showed preserved biventricular function with no significant valvular disease. CT surgery was consulted and CABG was scheduled for 2/17.   Three vessel CABG was performed with LIMA to LAD, SVG to OM1, and SVG to PDA.   2L LR given intraop Pump Time: 83 min EBL:[id number]   Pertinent  Medical History  CAD s/p PCI to the proximal and mid RCA 2011, mid RCA 2018, and left circ 2020. Hypertension, hyperlipidemia, alcohol abuse, former smoker.   Significant Hospital Events: Including procedures, antibiotic start and stop dates in addition to other pertinent events   2/17 elective CABG x3  Interim History / Subjective:  Sedated and intubated post op   Objective   Blood pressure 133/76, pulse 62, temperature 98.9 F (37.2 C), temperature source Oral, resp. rate 15, height 5\' 10"  (1.778 m), weight 70.3 kg, SpO2 98%.        Intake/Output Summary (Last 24 hours) at 11/08/2023 1319 Last data filed at 11/08/2023 1151 Gross per 24 hour  Intake 1150 ml  Output --  Net 1150 ml   Filed Weights   11/08/23 0752  Weight: 70.3 kg    Examination: General: sedated and intubated, ill  appearing  HENT: ET tube present  Lungs: ventilated, clear bilateral, diminished in the right base  Cardiovascular: RRR, chest tubes present  Abdomen: soft Extremities: no edema  Neuro: sedated, unable to assess GU: clear yellow  Resolved Hospital Problem list     Assessment & Plan:  CAD s/p CABG x3 2/17 Plan Wean pressors and inotropes as directed by surgical team Close obs of chest tube output (10ml) present on initial arrival to ICU  Cont tele Multimodal pain control Glycemic control w/ goal glucose 140-180 Start post op ASA 2/18 Resume statin post extubation Post-op BB w/ hold parameters   Ventilator management  Remains intubated post op  Plan VAP bundle Wean sedation  Rapid CVTS weaning protocol  Cont pulse ox Am CXR Encourage IS post extubation   Hx hypertension  Plan See above  Best Practice (right click and "Reselect all SmartList Selections" daily)   Diet/type: NPO DVT prophylaxis SCD Pressure ulcer(s): N/A GI prophylaxis: PPI Lines: Central line Foley:  Yes, and it is still needed Code Status:  full code Last date of multidisciplinary goals of care discussion []   Labs   CBC: Recent Labs  Lab 11/04/23 1155 11/08/23 1038 11/08/23 1042 11/08/23 1159 11/08/23 1223 11/08/23 1237 11/08/23 1258  WBC 10.7*  --   --   --   --   --   --   HGB 17.0   < > 12.9* 12.6* 9.2*  11.2* 10.8*  HCT 46.8   < > 38.0* 37.0* 27.0* 33.0* 29.7*  MCV 88.5  --   --   --   --   --   --   PLT 236  --   --   --   --   --  195   < > = values in this interval not displayed.    Basic Metabolic Panel: Recent Labs  Lab 11/04/23 1155 11/08/23 1038 11/08/23 1042 11/08/23 1159 11/08/23 1223 11/08/23 1237  NA 140 142 141 139 140 142  K 3.7 2.8* 2.8* 3.1* 3.8 3.6  CL 102  --  104 104  --   --   CO2 27  --   --   --   --   --   GLUCOSE 109*  --  101* 110*  --   --   BUN 6  --  7 7  --   --   CREATININE 0.83  --  0.60* 0.60*  --   --   CALCIUM 9.6  --   --   --   --    --    GFR: Estimated Creatinine Clearance: 98.9 mL/min (A) (by C-G formula based on SCr of 0.6 mg/dL (L)). Recent Labs  Lab 11/04/23 1155  WBC 10.7*    Liver Function Tests: Recent Labs  Lab 11/04/23 1155  AST 20  ALT 20  ALKPHOS 96  BILITOT 1.8*  PROT 8.6*  ALBUMIN 4.2   No results for input(s): "LIPASE", "AMYLASE" in the last 168 hours. No results for input(s): "AMMONIA" in the last 168 hours.  ABG    Component Value Date/Time   PHART 7.358 11/08/2023 1223   PCO2ART 38.3 11/08/2023 1223   PO2ART 250 (H) 11/08/2023 1223   HCO3 22.1 11/08/2023 1237   TCO2 23 11/08/2023 1237   ACIDBASEDEF 3.0 (H) 11/08/2023 1237   O2SAT 82 11/08/2023 1237     Coagulation Profile: Recent Labs  Lab 11/04/23 1155  INR 1.0    Cardiac Enzymes: No results for input(s): "CKTOTAL", "CKMB", "CKMBINDEX", "TROPONINI" in the last 168 hours.  HbA1C: Hgb A1c MFr Bld  Date/Time Value Ref Range Status  11/04/2023 11:55 AM 4.7 (L) 4.8 - 5.6 % Final    Comment:    (NOTE) Pre diabetes:          5.7%-6.4%  Diabetes:              >6.4%  Glycemic control for   <7.0% adults with diabetes     CBG: No results for input(s): "GLUCAP" in the last 168 hours.  Review of Systems:   Unable to assess  Past Medical History:  He,  has a past medical history of Anxiety, CAD (coronary artery disease), HLD (hyperlipidemia), Hypertension, PVC (premature ventricular contraction), and Thrombocytopenia (HCC).   Surgical History:   Past Surgical History:  Procedure Laterality Date   COLONOSCOPY WITH PROPOFOL N/A 12/12/2015   Procedure: COLONOSCOPY WITH PROPOFOL;  Surgeon: Scot Jun, MD;  Location: [facility] ENDOSCOPY;  Service: Endoscopy;  Laterality: N/A;   COLONOSCOPY WITH PROPOFOL N/A 06/23/2022   Procedure: COLONOSCOPY WITH PROPOFOL;  Surgeon: Regis Bill, MD;  Location: [facility] ENDOSCOPY;  Service: Endoscopy;  Laterality: N/A;   CORONARY STENT INTERVENTION N/A 02/24/2017   Procedure:  Coronary Stent Intervention;  Surgeon: Marcina Millard, MD;  Location: [facility] INVASIVE CV LAB;  Service: Cardiovascular;  Laterality: N/A;   CORONARY STENT INTERVENTION N/A 11/11/2018   Procedure: CORONARY STENT INTERVENTION;  Surgeon:  Callwood, Dwayne D, MD;  Location: [facility] INVASIVE CV LAB;  Service: Cardiovascular;  Laterality: N/A;   ESOPHAGOGASTRODUODENOSCOPY (EGD) WITH PROPOFOL N/A 06/23/2022   Procedure: ESOPHAGOGASTRODUODENOSCOPY (EGD) WITH PROPOFOL;  Surgeon: Regis Bill, MD;  Location: [facility] ENDOSCOPY;  Service: Endoscopy;  Laterality: N/A;   LEFT HEART CATH AND CORONARY ANGIOGRAPHY Left 02/24/2017   Procedure: Left Heart Cath and Coronary Angiography;  Surgeon: Marcina Millard, MD;  Location: [facility] INVASIVE CV LAB;  Service: Cardiovascular;  Laterality: Left;   LEFT HEART CATH AND CORONARY ANGIOGRAPHY N/A 11/11/2018   Procedure: LEFT HEART CATH AND CORONARY ANGIOGRAPHY with possible pci and stent;  Surgeon: Alwyn Pea, MD;  Location: [facility] INVASIVE CV LAB;  Service: Cardiovascular;  Laterality: N/A;   LEFT HEART CATH AND CORONARY ANGIOGRAPHY Left 10/27/2023   Procedure: LEFT HEART CATH AND CORONARY ANGIOGRAPHY;  Surgeon: Marcina Millard, MD;  Location: [facility] INVASIVE CV LAB;  Service: Cardiovascular;  Laterality: Left;   stents       Social History:   reports that he quit smoking about 6 years ago. His smoking use included cigarettes. He started smoking about 31 years ago. He has a 6.3 pack-year smoking history. He has never used smokeless tobacco. He reports current alcohol use of about 8.0 standard drinks of alcohol per week. He reports that he does not use drugs.   Family History:  His family history includes CAD in his father and mother; Cancer in his father; Hypertension in his mother.   Allergies Allergies  Allergen Reactions   Lopid [Gemfibrozil] Other (See Comments)    Legs hurt   Statins Other (See Comments)    Pain and cramps      Home Medications   Prior to Admission medications   Medication Sig Start Date End Date Taking? Authorizing Provider  amLODipine (NORVASC) 10 MG tablet Take 10 mg by mouth daily. 05/11/22  Yes [provider]  aspirin 81 MG tablet Take 81 mg by mouth daily.    Yes [provider]  clopidogrel (PLAVIX) 75 MG tablet Take 1 tablet (75 mg total) by mouth daily. 05/19/22  Yes Masoud, Renda Rolls, MD  Coenzyme Q10 100 MG TABS Take 100 mg by mouth daily.   Yes [provider]  loratadine (CLARITIN) 10 MG tablet Take 1 tablet (10 mg total) by mouth daily. 06/10/22  Yes Masoud, Renda Rolls, MD  metoprolol succinate (TOPROL-XL) 50 MG 24 hr tablet Take 75 mg by mouth daily. Take with or immediately following a meal.   Yes [provider]  Omega-3 Fatty Acids (FISH OIL PO) Take 690 mg by mouth daily.   Yes [provider]  pravastatin (PRAVACHOL) 20 MG tablet Take 20 mg by mouth at bedtime. 08/05/22 10/27/23  [provider]     Critical care time:

## 2023-11-08 NOTE — Progress Notes (Signed)
 Extubated.  Looks comfortable Getting IVF bolus, got a little hypertensive while weaning, placed on NTG. BP low now that extubated (mid 90s) responding to IVFs Plan Cont post op care

## 2023-11-08 NOTE — Progress Notes (Signed)
 Patient ID: [name], male   DOB: 16-Jan-1966, 58 y.o.   MRN: [id number]   TCTS Evening Rounds:   Hemodynamically stable  CI = 4.2 by Flowtrack  Has started to wake up on vent. Weaning.  Urine output good  CT output low  CBC    Component Value Date/Time   WBC 9.8 11/08/2023 1459   RBC 3.69 (L) 11/08/2023 1459   HGB 11.9 (L) 11/08/2023 1459   HCT 32.8 (L) 11/08/2023 1459   PLT 149 (L) 11/08/2023 1459   MCV 88.9 11/08/2023 1459   MCH 32.2 11/08/2023 1459   MCHC 36.3 (H) 11/08/2023 1459   RDW 12.6 11/08/2023 1459   LYMPHSABS 1,833 05/27/2022 1005   MONOABS 0.9 11/10/2018 2028   EOSABS 40 05/27/2022 1005   BASOSABS 32 05/27/2022 1005     BMET    Component Value Date/Time   NA 140 11/08/2023 1405   K 3.6 11/08/2023 1405   CL 104 11/08/2023 1405   CO2 27 11/04/2023 1155   GLUCOSE 142 (H) 11/08/2023 1405   BUN 6 11/08/2023 1405   CREATININE 0.60 (L) 11/08/2023 1405   CREATININE 0.87 05/27/2022 1005   CALCIUM 9.6 11/04/2023 1155   EGFR 100 05/27/2022 1005   GFRNONAA >60 11/04/2023 1155   GFRNONAA 100 11/08/2020 1122     A/P:  Stable postop course. Continue current plans

## 2023-11-08 NOTE — Discharge Summary (Addendum)
 [address]       [place] [address]             [phone]    Physician Discharge Summary  Patient ID: [name] MRN: [id number] DOB/AGE: 1965-12-26 58 y.o.  Admit date: 11/08/2023 Discharge date: 11/12/2023  Admission Diagnoses:  Patient Active Problem List   Diagnosis Date Noted   New onset atrial fibrillation (HCC) 11/10/2023   ABLA (acute blood loss anemia) 11/10/2023   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   Coronary artery disease 11/08/2023   Loss of appetite for more than 2 weeks 06/18/2022   Weight loss 06/18/2022   Moderate mixed hyperlipidemia not requiring statin therapy 05/19/2022   Bilateral carotid bruits 11/08/2020   Ischemic chest pain (HCC) 11/10/2018   Primary hypertension 11/10/2018   HLD (hyperlipidemia) 11/10/2018   Anxiety 11/10/2018   Chest pain 11/10/2018   S/P drug eluting coronary stent placement 06/14/2017   Thrombocytopenia (HCC) 03/22/2017   Coronary artery disease of native artery of native heart with stable angina pectoris (HCC) 02/24/2017   Polycythemia 02/26/2016   Migraine headache with aura 01/01/2016   Primary insomnia 02/12/2015   PVC (premature ventricular contraction) 01/23/2015     Discharge Diagnoses:  Patient Active Problem List   Diagnosis Date Noted   New onset atrial fibrillation (HCC) 11/10/2023   ABLA (acute blood loss anemia) 11/10/2023   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   Coronary artery disease 11/08/2023   Loss of appetite for more than 2 weeks 06/18/2022   Weight loss 06/18/2022   Moderate mixed hyperlipidemia not requiring statin therapy 05/19/2022   Bilateral carotid bruits 11/08/2020   Ischemic chest pain (HCC) 11/10/2018   Primary hypertension 11/10/2018   HLD (hyperlipidemia) 11/10/2018   Anxiety 11/10/2018   Chest pain 11/10/2018   S/P drug eluting coronary stent placement 06/14/2017   Thrombocytopenia (HCC) 03/22/2017   Coronary artery disease of native artery of native heart with stable angina  pectoris (HCC) 02/24/2017   Polycythemia 02/26/2016   Migraine headache with aura 01/01/2016   Primary insomnia 02/12/2015   PVC (premature ventricular contraction) 01/23/2015     Discharged Condition: Stable  HPI: This is a 58 year old male with a past medical history of hyperlipidemia, hypertension, PAD, thrombocytopenia, remote tobacco abuse who was found to have multivessel coronary artery disease. Dr. Cliffton Asters saw him in the office on 10/29/2023. Dr. Cliffton Asters discussed the need for coronary artery disease. Potential risks, benefits, and complications of the surgery were discussed with the patient and he agreed to proceed with surgery. Pre operative carotid duplex US showed right ICA are consistent with a 80-99% stenosis and a left ICA are consistent with a 60-79% stenosis. He will need to follow up with vascular surgery as an outpatient.  Hospital Course: Patient underwent a CABG x 3. He was transported from the OR to [facility] ICU in stable condition. He was extubated early the evening of surgery. A line, chest tubes, and foley were removed early in his post operative course. He was started on Lopressor and this was titrated accordingly. He was transitioned off the Insulin drip. His pre op HGA1C was 4.7. Accu checks and SS PRN will be stopped upon transfer from the ICU. He was above pre op weight with LE edema so he was given Lasix. Of note, he had bilateral internal carotid artery stenoses on pre op work up. Patient will need another carotid duplex US and CTA of the neck and see  Dr. Karin Lieu from vascular surgery in about 2 months. Epicardial wires were removed on post op day 2 and he was restarted on Plavix. He has had nausea and vomiting. He was given Zofran PRN and scheduled Reglan. He developed urinary retention and was started on Flomax. He was stable for transfer from the ICU to 4E for further convalescence on 02/19. He developed postoperative atrial fibrillation with RVR, he was started on  Amiodarone gtt. He chemically converted to NSR, IV Amiodarone was transitioned to PO Amiodarone. He continued to maintain sinus rhythm. His nausea resolved with time, appetite improved and his bowels began moving appropriately. He was ambulating without difficulty and his incisions were healing well without sign of infection. He has been ambulating on room air. Toprol XL was increased to 100 mg daily for better HR and BP control. He was felt stable for discharge home.   Consults: pulmonary/intensive care  Significant Diagnostic Studies:   Narrative & Impression  CLINICAL DATA:  Post CABG   EXAM: PORTABLE CHEST - 1 VIEW   COMPARISON:  11/09/2023   FINDINGS: Stable right IJ central venous catheter and drains. No pneumothorax. Small left pleural effusion. Stable infrahilar consolidation/atelectasis left greater than right.   Heart size and mediastinal contours are within normal limits. CABG markers.   No pneumothorax.   Sternotomy wires.   IMPRESSION: Small left pleural effusion and infrahilar consolidation/atelectasis.     Electronically Signed   By: Corlis Leak M.D.   On: 11/10/2023 10:15    Treatments: surgery:  CABG X 3.  LIMA LAD, RSVG PDA, OM   Endoscopic greater saphenous vein harvest on the right by Dr. Cliffton Asters on 11/08/2023.  Discharge Exam: Blood pressure (!) 141/81, pulse 84, temperature 98.1 F (36.7 C), temperature source Oral, resp. rate (!) 24, height 5\' 10"  (1.778 m), weight 69.2 kg, SpO2 95%. Cardiovascular: RRR Pulmonary: Slightly diminished left base;otherwise, clear Abdomen: Soft, non tender, bowel sounds present. Extremities: Trace bilateral lower extremity edema. Mild ecchymosis right thigh Wounds: Clean and dry.  No erythema or signs of infection.   Discharge Medications:  The patient has been discharged on:   1.Beta Blocker:  Yes [  x ]                              No   [   ]                              If No, reason:  2.Ace  Inhibitor/ARB: Yes [   ]                                     No  [  x  ]                                     If No, reason: Will try to start as outpatient as BP allows  3.Statin:   Yes [  x ]                  No  [   ]                  If No, reason:  4.Ecasa:  Yes  [  x ]                  No   [   ]                  If No, reason:  Patient had ACS upon admission:No  Plavix/P2Y12 inhibitor: Yes [ x  ] He was on Plavix prior to surgery                                      No  [   ]     Discharge Instructions     Amb Referral to Cardiac Rehabilitation   Complete by: As directed    Diagnosis: CABG   CABG X ___: 3   After initial evaluation and assessments completed: Virtual Based Care may be provided alone or in conjunction with Phase 2 Cardiac Rehab based on patient barriers.: Yes   Intensive Cardiac Rehabilitation (ICR) [facility] location only OR Traditional Cardiac Rehabilitation (TCR) *If criteria for ICR are not met will enroll in TCR [facility] only): Yes      Allergies as of 11/12/2023       Reactions   Lopid [gemfibrozil] Other (See Comments)   Legs hurt   Statins Other (See Comments)   Pain and cramps        Medication List     TAKE these medications    amiodarone 200 MG tablet Commonly known as: PACERONE Take 2 tablets (400mg ) twice per day for 7 days, then take 1 tablet (200mg ) twice per day for 7 days, then take 1 tablet (200mg ) daily thereafter   amLODipine 10 MG tablet Commonly known as: NORVASC Take 10 mg by mouth daily.   aspirin 81 MG tablet Take 81 mg by mouth daily.   clopidogrel 75 MG tablet Commonly known as: PLAVIX Take 1 tablet (75 mg total) by mouth daily.   Coenzyme Q10 100 MG Tabs Take 100 mg by mouth daily.   FISH OIL PO Take 690 mg by mouth daily.   furosemide 40 MG tablet Commonly known as: LASIX Take 1 tablet (40 mg total) by mouth daily. For 4 days then stop   loratadine 10 MG tablet Commonly known as: CLARITIN Take 1 tablet  (10 mg total) by mouth daily.   metoprolol succinate 100 MG 24 hr tablet Commonly known as: TOPROL-XL Take 1 tablet (100 mg total) by mouth daily. Take with or immediately following a meal. What changed:  medication strength how much to take   oxyCODONE 5 MG immediate release tablet Commonly known as: Oxy IR/ROXICODONE Take 1 tablet (5 mg total) by mouth every 6 (six) hours as needed for severe pain (pain score 7-10).   potassium chloride SA 20 MEQ tablet Commonly known as: KLOR-CON M Take 1 tablet (20 mEq total) by mouth daily. For 4 days then stop   pravastatin 20 MG tablet Commonly known as: PRAVACHOL Take 1 tablet (20 mg total) by mouth at bedtime.   tamsulosin 0.4 MG Caps capsule Commonly known as: FLOMAX Take 1 capsule (0.4 mg total) by mouth daily.        Follow-up Information     Corliss Skains, MD Follow up on 11/19/2023.   Specialty: Cardiothoracic Surgery Why: Appointment is VIRTUAL, please do NOT go to the office. Dr. Cliffton Asters will call you on 02/28 at 3:30PM Contact information: [address] [place]  [address] [phone]         Marcina Millard, MD. Nyra Capes on 11/29/2023.   Specialty: Cardiology Why: Appointment time is at 2:00 pm Contact information: [address] [place] [facility] [place] [address] [phone]         Debbra Riding Hestle, PA-C Follow up.   Specialty: Family Medicine Why: please contact your PCP to schedule hospital follow up appt in 1-2 weeks Contact information: [address] [place] [address] [phone]                 Signed:  Ardelle Balls, PA-C 11/12/2023, 9:48 AM

## 2023-11-08 NOTE — Progress Notes (Signed)
  Echocardiogram Echocardiogram Transesophageal has been performed.  [name] 11/08/2023, 10:48 AM

## 2023-11-08 NOTE — Interval H&P Note (Signed)
 History and Physical Interval Note:  11/08/2023 8:16 AM  [name]  has presented today for surgery, with the diagnosis of CAD.  The various methods of treatment have been discussed with the patient and family. After consideration of risks, benefits and other options for treatment, the patient has consented to  Procedure(s): CORONARY ARTERY BYPASS GRAFTING (CABG) (N/A) TRANSESOPHAGEAL ECHOCARDIOGRAM (TEE) (N/A) as a surgical intervention.  The patient's history has been reviewed, patient examined, no change in status, stable for surgery.  I have reviewed the patient's chart and labs.  Questions were answered to the patient's satisfaction.     Maureen Duesing Keane Scrape

## 2023-11-08 NOTE — Procedures (Signed)
 Extubation Procedure Note  Patient Details:   Name: [name] DOB: [birth year] MRN: [id number]   Airway Documentation:    Vent end date: 11/08/23 Vent end time: 1818   Evaluation  O2 sats: stable throughout Complications: No apparent complications Patient did tolerate procedure well. Bilateral Breath Sounds: Clear   Yes  Pt was extubated per rapid wean protocol. Placed on Harrison @ 4L. No complications. NIF greater than -40, VC was .96 ml with great effort. No present cuff leak and stridor. Continue to monitor.  [name] 11/08/2023, 6:22 PM

## 2023-11-08 NOTE — Transfer of Care (Signed)
 Immediate Anesthesia Transfer of Care Note  Patient: [name]  Procedure(s) Performed: CORONARY ARTERY BYPASS GRAFTING (CABG) TIMES THREE USING LEFT INTERNAL MAMMARY ARTERY AND ENDOSCOPICALLY HARVESTED RIGHT GREATER SAPHENOUS VEIN (Chest) TRANSESOPHAGEAL ECHOCARDIOGRAM (TEE)  Patient Location: SICU  Anesthesia Type:General  Level of Consciousness: sedated and Patient remains intubated per anesthesia plan  Airway & Oxygen Therapy: Patient remains intubated per anesthesia plan and Patient placed on Ventilator (see vital sign flow sheet for setting)  Post-op Assessment: Report given to RN and Post -op Vital signs reviewed and stable  Post vital signs: Reviewed and stable  Last Vitals:  Vitals Value Taken Time  BP 123/80 11/08/23 1455  Temp    Pulse 74 11/08/23 1500  Resp 16 11/08/23 1500  SpO2 100 % 11/08/23 1500  Vitals shown include unfiled device data.  Last Pain:  Vitals:   11/08/23 0801  TempSrc:   PainSc: 0-No pain      Patients Stated Pain Goal: 0 (11/08/23 0801)  Complications: No notable events documented.

## 2023-11-08 NOTE — Anesthesia Preprocedure Evaluation (Signed)
 Anesthesia Evaluation  Patient identified by MRN, date of birth, ID band Patient awake    Reviewed: Allergy & Precautions, H&P , NPO status , Patient's Chart, lab work & pertinent test results  Airway Mallampati: II   Neck ROM: full    Dental   Pulmonary former smoker   breath sounds clear to auscultation       Cardiovascular hypertension, + angina  + CAD   Rhythm:regular Rate:Normal  TTE (10/27/23): EF 55-60%, mild AI.   Neuro/Psych    GI/Hepatic   Endo/Other    Renal/GU      Musculoskeletal   Abdominal   Peds  Hematology   Anesthesia Other Findings   Reproductive/Obstetrics                             Anesthesia Physical Anesthesia Plan  ASA: 3  Anesthesia Plan: General   Post-op Pain Management:    Induction: Intravenous  PONV Risk Score and Plan: 2 and Ondansetron, Dexamethasone, Midazolam and Treatment may vary due to age or medical condition  Airway Management Planned: Oral ETT  Additional Equipment: Arterial line, CVP, TEE and Ultrasound Guidance Line Placement  Intra-op Plan:   Post-operative Plan: Post-operative intubation/ventilation  Informed Consent: I have reviewed the patients History and Physical, chart, labs and discussed the procedure including the risks, benefits and alternatives for the proposed anesthesia with the patient or authorized representative who has indicated his/her understanding and acceptance.     Dental advisory given  Plan Discussed with: CRNA, Anesthesiologist and Surgeon  Anesthesia Plan Comments:        Anesthesia Quick Evaluation

## 2023-11-08 NOTE — Anesthesia Procedure Notes (Signed)
 Central Venous Catheter Insertion Performed by: Achille Rich, MD, anesthesiologist Start/End2/17/2025 8:56 AM, 11/08/2023 9:10 AM Patient location: Pre-op. Preanesthetic checklist: patient identified, IV checked, site marked, risks and benefits discussed, surgical consent, monitors and equipment checked, pre-op evaluation, timeout performed and anesthesia consent Lidocaine 1% used for infiltration and patient sedated Hand hygiene performed  and maximum sterile barriers used  Catheter size: 8.5 Fr Sheath introducer Procedure performed using ultrasound guided technique. Ultrasound Notes:anatomy identified, needle tip was noted to be adjacent to the nerve/plexus identified, no ultrasound evidence of intravascular and/or intraneural injection and image(s) printed for medical record Attempts: 1 Following insertion, line sutured and dressing applied. Post procedure assessment: blood return through all ports, free fluid flow and no air  Patient tolerated the procedure well with no immediate complications.

## 2023-11-08 NOTE — Anesthesia Procedure Notes (Signed)
 Arterial Line Insertion Start/End2/17/2025 9:00 AM, 11/08/2023 9:04 AM Performed by: Achille Rich, MD, Sudie Grumbling, RN, CRNA  Preanesthetic checklist: patient identified, IV checked, site marked, risks and benefits discussed, surgical consent, monitors and equipment checked, pre-op evaluation, timeout performed and anesthesia consent Lidocaine 1% used for infiltration and patient sedated Left, radial was placed Catheter size: 20 G Hand hygiene performed , maximum sterile barriers used  and Seldinger technique used  Attempts: 1 Procedure performed without using ultrasound guided technique. Following insertion, dressing applied and Biopatch. Post procedure assessment: normal

## 2023-11-08 NOTE — Brief Op Note (Addendum)
 11/08/2023  1:29 PM  PATIENT:  [name]  59 y.o. male  PRE-OPERATIVE DIAGNOSIS:  CORONARY ARTERY DISEASE  POST-OPERATIVE DIAGNOSIS:  CORONARY ARTERY DISEASE  PROCEDURE:  TRANSESOPHAGEAL ECHOCARDIOGRAM (TEE), CORONARY ARTERY BYPASS GRAFTING (CABG) TIMES x 3 (LIMA to LAD, SVG to OM1, SVG to PDA) USING LEFT INTERNAL MAMMARY ARTERY AND ENDOSCOPICALLY HARVESTED RIGHT GREATER SAPHENOUS VEIN  Findings: Left internal mammary artery was intramyocardial  Vein harvest time: 27 min Vein prep time: 14 min  SURGEON:  Surgeons and Role:    Corliss Skains, MD - Primary  PHYSICIAN ASSISTANT: Doree Fudge PA-C  ASSISTANTS: Kristie Cowman RNFA   ANESTHESIA:   general  EBL:  Per perfusion, anesthesia record  DRAINS:  Chest tubes placed in the mediastinal and pleural spaces    COUNTS CORRECT:  YES  DICTATION: .Dragon Dictation  PLAN OF CARE: Admit to inpatient   PATIENT DISPOSITION:  ICU - intubated and hemodynamically stable.   Delay start of Pharmacological VTE agent (>24hrs) due to surgical blood loss or risk of bleeding: no  BASELINE WEIGHT: 70.3 kg

## 2023-11-08 NOTE — Anesthesia Procedure Notes (Signed)
 Procedure Name: Intubation Date/Time: 11/08/2023 10:15 AM  Performed by: Sudie Grumbling, CRNAPre-anesthesia Checklist: Patient identified, Emergency Drugs available, Suction available and Patient being monitored Patient Re-evaluated:Patient Re-evaluated prior to induction Oxygen Delivery Method: Circle system utilized Preoxygenation: Pre-oxygenation with 100% oxygen Induction Type: IV induction Ventilation: Mask ventilation without difficulty Laryngoscope Size: Mac and 4 Grade View: Grade I Tube type: Oral Tube size: 8.0 mm Number of attempts: 1 Airway Equipment and Method: Stylet and Oral airway Placement Confirmation: ETT inserted through vocal cords under direct vision, positive ETCO2 and breath sounds checked- equal and bilateral Secured at: 22 cm Tube secured with: Tape Dental Injury: Teeth and Oropharynx as per pre-operative assessment

## 2023-11-08 NOTE — Hospital Course (Addendum)
 HPI: This is a 57 year old male with a past medical history of hyperlipidemia, hypertension, PAD, thrombocytopenia, remote tobacco abuse who was found to have multivessel coronary artery disease. Dr. Cliffton Asters saw him in the office on 10/29/2023. Dr. Cliffton Asters discussed the need for coronary artery disease. Potential risks, benefits, and complications of the surgery were discussed with the patient and he agreed to proceed with surgery. Pre operative carotid duplex US showed right ICA are consistent with a 80-99% stenosis and a left ICA are consistent with a 60-79% stenosis. He will need to follow up with vascular surgery as an outpatient.  Hospital Course: Patient underwent a CABG x 3. He was transported from the OR to [facility] ICU in stable condition. He was extubated early the evening of surgery. A line, chest tubes, and foley were removed early in his post operative course. He was started on Lopressor and this was titrated accordingly. He was transitioned off the Insulin drip. His pre op HGA1C was 4.7. Accu checks and SS PRN will be stopped upon transfer from the ICU. He was above pre op weight with LE edema so he was given Lasix. Of note, he had bilateral internal carotid artery stenoses on pre op work up. Patient will need another carotid duplex US and CTA of the neck and see Dr. Karin Lieu from vascular surgery in about 2 months. Epicardial wires were removed on post op day 2 and he was restarted on Plavix.

## 2023-11-08 NOTE — Op Note (Signed)
 [address]       [name] [address]             [phone]                                          11/08/2023 Patient:  [name] Pre-Op Dx: CAD PVD HTN HLP    Post-op Dx:  same Procedure: CABG X 3.  LIMA LAD, RSVG PDA, OM   Endoscopic greater saphenous vein harvest on the right   Surgeon and Role:      * Evaleen Sant, Eliezer Lofts, MD - Primary    * Jacques Earthly , PA-C - assisting An experienced assistant was required given the complexity of this surgery and the standard of surgical care. The assistant was needed for exposure, dissection, suctioning, retraction of delicate tissues and sutures, instrument exchange and for overall help during this procedure.    Anesthesia  general EBL:  Blood Administration: none Xclamp Time:  60 min Pump Time:   Drains: 19 F blake drain:  L, mediastinal  Wires: V Counts: correct   Indications: 58yo male with 3V CAD.  Echocardiogram shows preserved biventricular function and no significant valvular disease.  The risks and benefits of CABG were discussed in detail.  The patient is agreeable to proceed.  His plavix will need to be held for 5 days prior to surgery   Findings: Good vein, good LIMA.  Intramyocardial LAD.  Good PDA, and OM  Operative Technique: All invasive lines were placed in pre-op holding.  After the risks, benefits and alternatives were thoroughly discussed, the patient was brought to the operative theatre.  Anesthesia was induced, and the patient was prepped and draped in normal sterile fashion.  An appropriate surgical pause was performed, and pre-operative antibiotics were dosed accordingly.  We began with simultaneous incisions along the right leg for harvesting of the greater saphenous vein and the chest for the sternotomy.  In regards to the sternotomy, this was carried down with bovie cautery, and the sternum was divided with a reciprocating saw.  Meticulous hemostasis was  obtained.  The left internal thoracic artery was exposed and harvested in in pedicled fashion.  The patient was systemically heparinized, and the artery was divided distally, and placed in a papaverine sponge.    The sternal elevator was removed, and a retractor was placed.  The pericardium was divided in the midline and fashioned into a cradle with pericardial stitches.   After we confirmed an appropriate ACT, the ascending aorta was cannulated in standard fashion.  The right atrial appendage was used for venous cannulation site.  Cardiopulmonary bypass was initiated, and the heart retractor was placed. The cross clamp was applied, and a dose of anterograde cardioplegia was given with good arrest of the heart.  We moved to the posterior wall of the heart, and found a good target on the PDA.  An arteriotomy was made, and the vein graft was anastomosed to it in an end to side fashion.  Next we exposed the lateral wall, and found a good target on the OM.  An end to side anastomosis with the vein graft was then created.  Finally, we exposed a good target on the  LAD, and fashioned an end to side anastomosis between it and the LITA.  We began to re-warm, and a re-animation  dose of cardioplegia was given.  The heart was de-aired, and the cross clamp was removed.  Meticulous hemostasis was obtained.    A partial occludding clamp was then placed on the ascending aorta, and we created an end to side anastomosis between it and the proximal vein grafts.  Rings were placed on the proximal anastomosis.  Hemostasis was obtained, and we separated from cardiopulmonary bypass without event.  The heparin was reversed with protamine.  Chest tubes and wires were placed, and the sternum was re-approximated with sternal wires.  The soft tissue and skin were re-approximated wth absorbable suture.    The patient tolerated the procedure without any immediate complications, and was transferred to the ICU in guarded  condition.  [name]

## 2023-11-08 NOTE — Discharge Instructions (Signed)

## 2023-11-09 ENCOUNTER — Encounter (HOSPITAL_COMMUNITY): Payer: Self-pay | Admitting: Thoracic Surgery (Cardiothoracic Vascular Surgery)

## 2023-11-09 ENCOUNTER — Inpatient Hospital Stay (HOSPITAL_COMMUNITY): Payer: 59

## 2023-11-09 DIAGNOSIS — Z951 Presence of aortocoronary bypass graft: Secondary | ICD-10-CM

## 2023-11-09 DIAGNOSIS — R739 Hyperglycemia, unspecified: Secondary | ICD-10-CM | POA: Diagnosis not present

## 2023-11-09 DIAGNOSIS — I25118 Atherosclerotic heart disease of native coronary artery with other forms of angina pectoris: Secondary | ICD-10-CM

## 2023-11-09 DIAGNOSIS — D62 Acute posthemorrhagic anemia: Secondary | ICD-10-CM

## 2023-11-09 LAB — CBC
HCT: 32.1 % — ABNORMAL LOW (ref 39.0–52.0)
HCT: 34.1 % — ABNORMAL LOW (ref 39.0–52.0)
Hemoglobin: 10.9 g/dL — ABNORMAL LOW (ref 13.0–17.0)
Hemoglobin: 11.7 g/dL — ABNORMAL LOW (ref 13.0–17.0)
MCH: 31.3 pg (ref 26.0–34.0)
MCH: 32 pg (ref 26.0–34.0)
MCHC: 34 g/dL (ref 30.0–36.0)
MCHC: 34.3 g/dL (ref 30.0–36.0)
MCV: 92.2 fL (ref 80.0–100.0)
MCV: 93.2 fL (ref 80.0–100.0)
Platelets: 174 10*3/uL (ref 150–400)
Platelets: 195 10*3/uL (ref 150–400)
RBC: 3.48 MIL/uL — ABNORMAL LOW (ref 4.22–5.81)
RBC: 3.66 MIL/uL — ABNORMAL LOW (ref 4.22–5.81)
RDW: 13 % (ref 11.5–15.5)
RDW: 13.2 % (ref 11.5–15.5)
WBC: 12 10*3/uL — ABNORMAL HIGH (ref 4.0–10.5)
WBC: 15.9 10*3/uL — ABNORMAL HIGH (ref 4.0–10.5)
nRBC: 0 % (ref 0.0–0.2)
nRBC: 0 % (ref 0.0–0.2)

## 2023-11-09 LAB — POCT I-STAT 7, (LYTES, BLD GAS, ICA,H+H)
Acid-base deficit: 6 mmol/L — ABNORMAL HIGH (ref 0.0–2.0)
Acid-base deficit: 4 mmol/L — ABNORMAL HIGH (ref 0.0–2.0)
Acid-base deficit: 4 mmol/L — ABNORMAL HIGH (ref 0.0–2.0)
Bicarbonate: 18.4 mmol/L — ABNORMAL LOW (ref 20.0–28.0)
Bicarbonate: 21.4 mmol/L (ref 20.0–28.0)
Bicarbonate: 21.6 mmol/L (ref 20.0–28.0)
Calcium, Ion: 1.02 mmol/L — ABNORMAL LOW (ref 1.15–1.40)
Calcium, Ion: 1.08 mmol/L — ABNORMAL LOW (ref 1.15–1.40)
Calcium, Ion: 1.11 mmol/L — ABNORMAL LOW (ref 1.15–1.40)
HCT: 28 % — ABNORMAL LOW (ref 39.0–52.0)
HCT: 32 % — ABNORMAL LOW (ref 39.0–52.0)
HCT: 26 % — ABNORMAL LOW (ref 39.0–52.0)
Hemoglobin: 9.5 g/dL — ABNORMAL LOW (ref 13.0–17.0)
Hemoglobin: 10.9 g/dL — ABNORMAL LOW (ref 13.0–17.0)
Hemoglobin: 8.8 g/dL — ABNORMAL LOW (ref 13.0–17.0)
O2 Saturation: 99 %
O2 Saturation: 98 %
O2 Saturation: 99 %
Patient temperature: 35.4
Patient temperature: 99.6
Patient temperature: 99.7
Potassium: 2.7 mmol/L — CL (ref 3.5–5.1)
Potassium: 3.8 mmol/L (ref 3.5–5.1)
Potassium: 4.3 mmol/L (ref 3.5–5.1)
Sodium: 145 mmol/L (ref 135–145)
Sodium: 141 mmol/L (ref 135–145)
Sodium: 142 mmol/L (ref 135–145)
TCO2: 19 mmol/L — ABNORMAL LOW (ref 22–32)
TCO2: 23 mmol/L (ref 22–32)
TCO2: 23 mmol/L (ref 22–32)
pCO2 arterial: 27.6 mm[Hg] — ABNORMAL LOW (ref 32–48)
pCO2 arterial: 42.1 mm[Hg] (ref 32–48)
pCO2 arterial: 42.8 mm[Hg] (ref 32–48)
pH, Arterial: 7.425 (ref 7.35–7.45)
pH, Arterial: 7.316 — ABNORMAL LOW (ref 7.35–7.45)
pH, Arterial: 7.314 — ABNORMAL LOW (ref 7.35–7.45)
pO2, Arterial: 150 mm[Hg] — ABNORMAL HIGH (ref 83–108)
pO2, Arterial: 123 mm[Hg] — ABNORMAL HIGH (ref 83–108)
pO2, Arterial: 141 mm[Hg] — ABNORMAL HIGH (ref 83–108)

## 2023-11-09 LAB — BASIC METABOLIC PANEL
Anion gap: 9 (ref 5–15)
Anion gap: 8 (ref 5–15)
BUN: 8 mg/dL (ref 6–20)
BUN: 9 mg/dL (ref 6–20)
CO2: 23 mmol/L (ref 22–32)
CO2: 26 mmol/L (ref 22–32)
Calcium: 8.1 mg/dL — ABNORMAL LOW (ref 8.9–10.3)
Calcium: 8.8 mg/dL — ABNORMAL LOW (ref 8.9–10.3)
Chloride: 108 mmol/L (ref 98–111)
Chloride: 101 mmol/L (ref 98–111)
Creatinine, Ser: 0.78 mg/dL (ref 0.61–1.24)
Creatinine, Ser: 0.92 mg/dL (ref 0.61–1.24)
GFR, Estimated: >60 mL/min (ref >60)
GFR, Estimated: >60 mL/min (ref >60)
Glucose, Bld: 124 mg/dL — ABNORMAL HIGH (ref 70–99)
Glucose, Bld: 157 mg/dL — ABNORMAL HIGH (ref 70–99)
Potassium: 4.3 mmol/L (ref 3.5–5.1)
Potassium: 3.8 mmol/L (ref 3.5–5.1)
Sodium: 140 mmol/L (ref 135–145)
Sodium: 135 mmol/L (ref 135–145)

## 2023-11-09 LAB — GLUCOSE, CAPILLARY
Glucose-Capillary: 113 mg/dL — ABNORMAL HIGH (ref 70–99)
Glucose-Capillary: 114 mg/dL — ABNORMAL HIGH (ref 70–99)
Glucose-Capillary: 125 mg/dL — ABNORMAL HIGH (ref 70–99)
Glucose-Capillary: 131 mg/dL — ABNORMAL HIGH (ref 70–99)
Glucose-Capillary: 133 mg/dL — ABNORMAL HIGH (ref 70–99)
Glucose-Capillary: 138 mg/dL — ABNORMAL HIGH (ref 70–99)
Glucose-Capillary: 139 mg/dL — ABNORMAL HIGH (ref 70–99)
Glucose-Capillary: 141 mg/dL — ABNORMAL HIGH (ref 70–99)
Glucose-Capillary: 150 mg/dL — ABNORMAL HIGH (ref 70–99)

## 2023-11-09 LAB — MAGNESIUM
Magnesium: 2.2 mg/dL (ref 1.7–2.4)
Magnesium: 2.1 mg/dL (ref 1.7–2.4)

## 2023-11-09 MED ORDER — CLOPIDOGREL BISULFATE 75 MG PO TABS: 75.0000 mg | ORAL_TABLET | Freq: Every day | ORAL | Status: DC

## 2023-11-09 MED ORDER — INSULIN ASPART 100 UNIT/ML IJ SOLN
0.0000 [IU] | INTRAMUSCULAR | Status: DC
  Administered 2023-11-09 – 2023-11-10 (×4): 2 [IU] via SUBCUTANEOUS

## 2023-11-09 MED ORDER — METHOCARBAMOL 500 MG PO TABS
500.0000 mg | ORAL_TABLET | Freq: Three times a day (TID) | ORAL | Status: DC
  Administered 2023-11-09 – 2023-11-10 (×5): 500 mg via ORAL
  Filled 2023-11-09 (×5): qty 1

## 2023-11-09 MED ORDER — KETOROLAC TROMETHAMINE 15 MG/ML IJ SOLN
15.0000 mg | Freq: Four times a day (QID) | INTRAMUSCULAR | Status: AC
  Administered 2023-11-09 – 2023-11-10 (×7): 15 mg via INTRAVENOUS
  Filled 2023-11-09 (×7): qty 1

## 2023-11-09 MED ORDER — FUROSEMIDE 10 MG/ML IJ SOLN
40.0000 mg | Freq: Once | INTRAMUSCULAR | Status: AC
  Administered 2023-11-09: 40 mg via INTRAVENOUS
  Filled 2023-11-09: qty 4

## 2023-11-09 MED ORDER — ENOXAPARIN SODIUM 40 MG/0.4ML IJ SOSY
40.0000 mg | PREFILLED_SYRINGE | Freq: Every day | INTRAMUSCULAR | Status: DC
  Administered 2023-11-09 – 2023-11-11 (×3): 40 mg via SUBCUTANEOUS
  Filled 2023-11-09 (×3): qty 0.4

## 2023-11-09 MED ORDER — CLOPIDOGREL BISULFATE 75 MG PO TABS
75.0000 mg | ORAL_TABLET | Freq: Every day | ORAL | Status: DC
  Administered 2023-11-10 – 2023-11-12 (×3): 75 mg via ORAL
  Filled 2023-11-09 (×3): qty 1

## 2023-11-09 NOTE — Progress Notes (Signed)
 NAME:  [name], MRN:  [id number], DOB:  August 28, 1966, LOS: 1 ADMISSION DATE:  11/08/2023, CONSULTATION DATE:  11/08/23 REFERRING MD:  Dr. Cliffton Asters CHIEF COMPLAINT:  CAD with unstable angina   History of Present Illness:  [name] is a 58 year old male who has been followed by [facility] cardiology for his CAD for some time (at least since 2018). He has had several caths with PCI to the prox and mid RCA in 2011, in-stent restenosis of the mid RCA in 2018, and prox left circ in 2020.   He was asymptomatic until about 12/2022 when be began experiencing intermittent chest pain. This progressed until 10/26/2023 when he was experiencing left sided exertional chest pain  that relieves with rest and rest pain about once per week. Cardiac cath 2/5 showed 3 vessel disease with 80% prox LAD, 80% OM1, and occluded RCA stents. Echo showed preserved biventricular function with no significant valvular disease. CT surgery was consulted and CABG was scheduled for 2/17.   Three vessel CABG was performed with LIMA to LAD, SVG to OM1, and SVG to PDA.   2L LR given intraop Pump Time: 83 min EBL:610   Pertinent  Medical History  CAD s/p PCI to the proximal and mid RCA 2011, mid RCA 2018, and left circ 2020. Hypertension, hyperlipidemia, alcohol abuse, former smoker.   Significant Hospital Events: Including procedures, antibiotic start and stop dates in addition to other pertinent events   2/17 elective CABG x3, extubated in the early evening 2/18 requiring as needed morphine overnight total of 10 mg from midnight to 630, not on any pressors or vasoactive infusions  Interim History / Subjective:    Objective   Blood pressure 116/65, pulse 84, temperature 99.8 F (37.7 C), temperature source Axillary, resp. rate 20, height 5\' 10"  (1.778 m), weight 73.2 kg, SpO2 95%. CVP:  [0 mmHg-83 mmHg] 52 mmHg CO:  [6.3 L/min-10.3 L/min] 7.6 L/min CI:  [3.7 L/min/m2-5.5 L/min/m2] 4.1 L/min/m2  Vent Mode: PSV;CPAP FiO2  (%):  [40 %-50 %] 40 % Set Rate:  [4 bmp-16 bmp] 4 bmp Vt Set:  [580 mL] 580 mL PEEP:  [5 cmH20] 5 cmH20 Pressure Support:  [10 cmH20] 10 cmH20 Plateau Pressure:  [16 cmH20] 16 cmH20   Intake/Output Summary (Last 24 hours) at 11/09/2023 0753 Last data filed at 11/09/2023 0700 Gross per 24 hour  Intake 5416.48 ml  Output 3810 ml  Net 1606.48 ml   Filed Weights   11/08/23 0752 11/09/23 0500  Weight: 70.3 kg 73.2 kg    Examination: General sitting up in bed no distress HENT NCAT no JVD Pulm clear. Dec bases. Currently on 2 lpm can pull 750 ml Vt on IS CT neg for airleak. Put out total 290 since arrival in ICU Card + gallop, + pericardial rub best heard on insp cycle Abd soft Ext warm dry pulses strong Gu clear yellow  Neuro intact  Resolved Hospital Problem list    Ventilator management Assessment & Plan:  CAD s/p CABG x3 2/17 Plan Continuing multimodal pain control, surgical team has added Robaxin and Toradol Mobilize Incentive spirometry Glycemic control w/ goal glucose 140-180, will change to [facility] and HS Cont asa and statin  Resume statin post extubation Low-dose scheduled Lopressor Tube management per surgical team Continue telemetry  Expected postoperative anemia Hemoglobin stable Plan Continue to monitor Trigger for transfusion is hemoglobin less than 8 or active bleeding with hemodynamic change  Hx hypertension  Plan See above  Best Practice (  right click and "Reselect all SmartList Selections" daily)   Diet/type: Regular consistency (see orders) DVT prophylaxis LMWH Pressure ulcer(s): N/A GI prophylaxis: PPI Lines: Central line Foley:  Yes, and it is no longer needed Code Status:  full code Last date of multidisciplinary goals of care discussion []    Critical care time: NA

## 2023-11-09 NOTE — Progress Notes (Addendum)
 [address]       [place] [address]             [phone]      1 Day Post-Op Procedure(s) (LRB): CORONARY ARTERY BYPASS GRAFTING (CABG) TIMES THREE USING LEFT INTERNAL MAMMARY ARTERY AND ENDOSCOPICALLY HARVESTED RIGHT GREATER SAPHENOUS VEIN (N/A) TRANSESOPHAGEAL ECHOCARDIOGRAM (TEE) (N/A) Subjective: Sitting in bed drinking grape juice. Patient states his pain is okay this AM, he does admit to nausea.   Objective: Vital signs in last 24 hours: Temp:  [95.4 F (35.2 C)-101.1 F (38.4 C)] 99.8 F (37.7 C) (02/17 2339) Pulse Rate:  [60-96] 84 (02/18 0700) Cardiac Rhythm: Normal sinus rhythm (02/18 0400) Resp:  [10-26] 20 (02/18 0700) BP: (91-156)/(45-93) 116/65 (02/18 0700) SpO2:  [92 %-100 %] 95 % (02/18 0700) Arterial Line BP: (88-168)/(45-79) 128/59 (02/18 0700) FiO2 (%):  [40 %-50 %] 40 % (02/17 1749) Weight:  [70.3 kg-73.2 kg] 73.2 kg (02/18 0500)  Hemodynamic parameters for last 24 hours: CVP:  [0 mmHg-83 mmHg] 52 mmHg CO:  [6.3 L/min-10.3 L/min] 7.6 L/min CI:  [3.7 L/min/m2-5.5 L/min/m2] 4.1 L/min/m2  Intake/Output from previous day: 02/17 0701 - 02/18 0700 In: 5416.5 [I.V.:2377.5; Blood:230; NG/GT:120; IV Piggyback:2689] Out: 3810 [Urine:2910; Blood:610; Chest Tube:290] Intake/Output this shift: No intake/output data recorded.  General appearance: alert, cooperative, and no distress Neurologic: intact Heart: regular rate and rhythm, S1, S2 normal, no murmur, click, rub or gallop Lungs: diminished bibasilar breath sounds Abdomen: hypoactive bowel sounds, nontender, no distension Extremities: SCDs in place Wound: Clean and dry EVH site, sternal incision with clean and dry dressing in place  Lab Results: Recent Labs    11/08/23 2023 11/09/23 0410  WBC 8.5 12.0*  HGB 10.6* 10.9*  HCT 30.3* 32.1*  PLT 136* 174   BMET:  Recent Labs    11/08/23 2023 11/09/23 0410  NA 139 140  K 4.3 4.3  CL 111 108  CO2 22 23  GLUCOSE 128*  124*  BUN 7 8  CREATININE 0.92 0.78  CALCIUM 7.8* 8.1*    PT/INR:  Recent Labs    11/08/23 1459  LABPROT 17.1*  INR 1.4*   ABG    Component Value Date/Time   PHART 7.314 (L) 11/08/2023 1934   HCO3 21.6 11/08/2023 1934   TCO2 23 11/08/2023 1934   ACIDBASEDEF 4.0 (H) 11/08/2023 1934   O2SAT 99 11/08/2023 1934   CBG (last 3)  Recent Labs    11/09/23 0200 11/09/23 0407 11/09/23 0603  GLUCAP 131* 125* 114*    Assessment/Plan: S/P Procedure(s) (LRB): CORONARY ARTERY BYPASS GRAFTING (CABG) TIMES THREE USING LEFT INTERNAL MAMMARY ARTERY AND ENDOSCOPICALLY HARVESTED RIGHT GREATER SAPHENOUS VEIN (N/A) TRANSESOPHAGEAL ECHOCARDIOGRAM (TEE) (N/A)  Neuro: Pain overnight requiring Morphine, Oxycodone, scheduled Tylenol and Tramadol. Can try scheduled Toradol and Robaxin.   CV: Off drips. Hypertensive during vent wean last night requiring NTG, this was d/c'd once extubated. Patient then developed hypotension requiring albumin, hypotension resolved. One episode of HTN overnight into the 180s requiring IV Lopressor 2.5mg . SBP 130 this AM, MAP 90s. CI 4.2 per flotrak. Start Lopressor. NSR with PVCs, HR 80s. Pacer set to DDD backup of 50.  Pulm: Extubated yesterday evening. Saturating well on 2L [place]. CXR with low lung volumes, bibasilar atelectasis. CT output 290cc/24hrs. Leave in place for now. Encourage IS and ambulation.   GI: Nausea this AM, 1 episode of vomiting last night due to pain per RN. Continue Reglan and zofran prn. Advance diet as  tolerated.   Endo: Preop A1C 4.7, no hx of DM. On Insulin drip, CBGs 131/125/114. Transition to SSI  Renal: Cr 0.78. UO 2910cc/24hrs.   ID: Likely reactive leukocytosis, tmax 101.1. Will monitor.   Expected postop ABLA: H/H 10.9/32.1, not clinically significant at this time.   DVT Prophylaxis: Start Lovenox  PAD: Pre operative carotid duplex US showed right ICA are consistent with a 80-99% stenosis and a left ICA are consistent with a 60-79%  stenosis. Needs to see vascular surgery as an outpatient.  Dispo: Continue ICU care   LOS: 1 day    Jenny Reichmann, PA-C 11/09/2023  Agree with above Will remove wires, and foley Will restart plavix Floor soon  Bertice Risse O Kerryn Tennant

## 2023-11-09 NOTE — Plan of Care (Signed)
  Problem: Clinical Measurements: Goal: Will remain free from infection Outcome: Progressing Goal: Respiratory complications will improve Outcome: Progressing   Problem: Nutrition: Goal: Adequate nutrition will be maintained Outcome: Progressing   Problem: Coping: Goal: Level of anxiety will decrease Outcome: Progressing   Problem: Elimination: Goal: Will not experience complications related to urinary retention Outcome: Progressing   Problem: Pain Managment: Goal: General experience of comfort will improve and/or be controlled Outcome: Progressing   Problem: Safety: Goal: Ability to remain free from injury will improve Outcome: Progressing   Problem: Education: Goal: Knowledge of disease or condition will improve Outcome: Progressing   Problem: Activity: Goal: Risk for activity intolerance will decrease Outcome: Progressing   Problem: Cardiac: Goal: Will achieve and/or maintain hemodynamic stability Outcome: Progressing   Problem: Clinical Measurements: Goal: Postoperative complications will be avoided or minimized Outcome: Progressing   Problem: Respiratory: Goal: Respiratory status will improve Outcome: Progressing

## 2023-11-09 NOTE — Anesthesia Postprocedure Evaluation (Signed)
 Anesthesia Post Note  Patient: [name]  Procedure(s) Performed: CORONARY ARTERY BYPASS GRAFTING (CABG) TIMES THREE USING LEFT INTERNAL MAMMARY ARTERY AND ENDOSCOPICALLY HARVESTED RIGHT GREATER SAPHENOUS VEIN (Chest) TRANSESOPHAGEAL ECHOCARDIOGRAM (TEE)     Patient location during evaluation: SICU Anesthesia Type: General Level of consciousness: sedated Pain management: pain level controlled Vital Signs Assessment: post-procedure vital signs reviewed and stable Respiratory status: patient remains intubated per anesthesia plan Cardiovascular status: stable Postop Assessment: no apparent nausea or vomiting Anesthetic complications: no   No notable events documented.  Last Vitals:  Vitals:   11/09/23 0645 11/09/23 0700  BP: 130/76 116/65  Pulse: 82 84  Resp: 15 20  Temp:    SpO2: 96% 95%    Last Pain:  Vitals:   11/09/23 0625  TempSrc:   PainSc: 9                  Erisa Mehlman S

## 2023-11-10 ENCOUNTER — Inpatient Hospital Stay (HOSPITAL_COMMUNITY): Payer: 59

## 2023-11-10 DIAGNOSIS — R739 Hyperglycemia, unspecified: Secondary | ICD-10-CM | POA: Diagnosis not present

## 2023-11-10 DIAGNOSIS — D62 Acute posthemorrhagic anemia: Secondary | ICD-10-CM | POA: Diagnosis not present

## 2023-11-10 DIAGNOSIS — I4891 Unspecified atrial fibrillation: Secondary | ICD-10-CM

## 2023-11-10 DIAGNOSIS — Z951 Presence of aortocoronary bypass graft: Secondary | ICD-10-CM | POA: Diagnosis not present

## 2023-11-10 DIAGNOSIS — I25118 Atherosclerotic heart disease of native coronary artery with other forms of angina pectoris: Secondary | ICD-10-CM | POA: Diagnosis not present

## 2023-11-10 LAB — BASIC METABOLIC PANEL
Anion gap: 9 (ref 5–15)
BUN: 11 mg/dL (ref 6–20)
CO2: 26 mmol/L (ref 22–32)
Calcium: 8.9 mg/dL (ref 8.9–10.3)
Chloride: 100 mmol/L (ref 98–111)
Creatinine, Ser: 0.72 mg/dL (ref 0.61–1.24)
GFR, Estimated: >60 mL/min (ref >60)
Glucose, Bld: 116 mg/dL — ABNORMAL HIGH (ref 70–99)
Potassium: 3.8 mmol/L (ref 3.5–5.1)
Sodium: 135 mmol/L (ref 135–145)

## 2023-11-10 LAB — GLUCOSE, CAPILLARY
Glucose-Capillary: 125 mg/dL — ABNORMAL HIGH (ref 70–99)
Glucose-Capillary: 115 mg/dL — ABNORMAL HIGH (ref 70–99)
Glucose-Capillary: 111 mg/dL — ABNORMAL HIGH (ref 70–99)
Glucose-Capillary: 91 mg/dL (ref 70–99)
Glucose-Capillary: 125 mg/dL — ABNORMAL HIGH (ref 70–99)
Glucose-Capillary: 113 mg/dL — ABNORMAL HIGH (ref 70–99)

## 2023-11-10 LAB — CBC
HCT: 34 % — ABNORMAL LOW (ref 39.0–52.0)
Hemoglobin: 11.6 g/dL — ABNORMAL LOW (ref 13.0–17.0)
MCH: 31.6 pg (ref 26.0–34.0)
MCHC: 34.1 g/dL (ref 30.0–36.0)
MCV: 92.6 fL (ref 80.0–100.0)
Platelets: 162 10*3/uL (ref 150–400)
RBC: 3.67 MIL/uL — ABNORMAL LOW (ref 4.22–5.81)
RDW: 13.1 % (ref 11.5–15.5)
WBC: 13.9 10*3/uL — ABNORMAL HIGH (ref 4.0–10.5)
nRBC: 0 % (ref 0.0–0.2)

## 2023-11-10 MED ORDER — SODIUM CHLORIDE 0.9% FLUSH: 3.0000 mL | INTRAVENOUS | Status: DC | PRN

## 2023-11-10 MED ORDER — AMIODARONE HCL IN DEXTROSE 360-4.14 MG/200ML-% IV SOLN
30.0000 mg/h | INTRAVENOUS | Status: DC
  Administered 2023-11-11: 30 mg/h via INTRAVENOUS
  Filled 2023-11-10: qty 200

## 2023-11-10 MED ORDER — MAGNESIUM SULFATE 2 GM/50ML IV SOLN
2.0000 g | Freq: Once | INTRAVENOUS | Status: AC
  Administered 2023-11-10: 2 g via INTRAVENOUS
  Filled 2023-11-10: qty 50

## 2023-11-10 MED ORDER — METOCLOPRAMIDE HCL 5 MG/ML IJ SOLN
10.0000 mg | Freq: Four times a day (QID) | INTRAMUSCULAR | Status: AC
  Administered 2023-11-10 (×3): 10 mg via INTRAVENOUS
  Filled 2023-11-10 (×4): qty 2

## 2023-11-10 MED ORDER — INSULIN ASPART 100 UNIT/ML IJ SOLN
0.0000 [IU] | Freq: Three times a day (TID) | INTRAMUSCULAR | Status: DC
  Administered 2023-11-10: 2 [IU] via SUBCUTANEOUS

## 2023-11-10 MED ORDER — AMIODARONE LOAD VIA INFUSION
150.0000 mg | Freq: Once | INTRAVENOUS | Status: AC
  Administered 2023-11-10: 150 mg via INTRAVENOUS
  Filled 2023-11-10: qty 83.34

## 2023-11-10 MED ORDER — MAGNESIUM SULFATE 2 GM/50ML IV SOLN: 2.0000 g | Freq: Once | INTRAVENOUS | Status: DC

## 2023-11-10 MED ORDER — POTASSIUM CHLORIDE 10 MEQ/50ML IV SOLN
10.0000 meq | INTRAVENOUS | Status: AC
  Administered 2023-11-10 (×2): 10 meq via INTRAVENOUS
  Filled 2023-11-10 (×2): qty 50

## 2023-11-10 MED ORDER — METOPROLOL TARTRATE 25 MG/10 ML ORAL SUSPENSION
25.0000 mg | Freq: Two times a day (BID) | ORAL | Status: DC
Start: 2023-11-10 — End: 2023-11-10

## 2023-11-10 MED ORDER — TAMSULOSIN HCL 0.4 MG PO CAPS
0.4000 mg | ORAL_CAPSULE | Freq: Every day | ORAL | Status: DC
  Administered 2023-11-10 – 2023-11-12 (×3): 0.4 mg via ORAL
  Filled 2023-11-10 (×3): qty 1

## 2023-11-10 MED ORDER — SODIUM CHLORIDE 0.9% FLUSH
3.0000 mL | Freq: Two times a day (BID) | INTRAVENOUS | Status: DC
  Administered 2023-11-10 – 2023-11-12 (×5): 3 mL via INTRAVENOUS

## 2023-11-10 MED ORDER — FUROSEMIDE 40 MG PO TABS
40.0000 mg | ORAL_TABLET | Freq: Every day | ORAL | Status: DC
  Administered 2023-11-10 – 2023-11-12 (×3): 40 mg via ORAL
  Filled 2023-11-10 (×3): qty 1

## 2023-11-10 MED ORDER — PROCHLORPERAZINE EDISYLATE 10 MG/2ML IJ SOLN
10.0000 mg | Freq: Four times a day (QID) | INTRAMUSCULAR | Status: DC | PRN
  Administered 2023-11-10 (×2): 10 mg via INTRAVENOUS
  Filled 2023-11-10 (×4): qty 2

## 2023-11-10 MED ORDER — AMIODARONE HCL IN DEXTROSE 360-4.14 MG/200ML-% IV SOLN
60.0000 mg/h | INTRAVENOUS | Status: AC
  Administered 2023-11-10 (×3): 60 mg/h via INTRAVENOUS
  Filled 2023-11-10 (×3): qty 200

## 2023-11-10 MED ORDER — ~~LOC~~ CARDIAC SURGERY, PATIENT & FAMILY EDUCATION: Freq: Once | Status: AC

## 2023-11-10 MED ORDER — ASPIRIN 81 MG PO TBEC
81.0000 mg | DELAYED_RELEASE_TABLET | Freq: Every day | ORAL | Status: DC
  Administered 2023-11-10 – 2023-11-12 (×3): 81 mg via ORAL
  Filled 2023-11-10 (×3): qty 1

## 2023-11-10 MED ORDER — SODIUM CHLORIDE 0.9 % IV SOLN: 250.0000 mL | INTRAVENOUS | Status: AC | PRN

## 2023-11-10 MED ORDER — AMLODIPINE BESYLATE 10 MG PO TABS
10.0000 mg | ORAL_TABLET | Freq: Every day | ORAL | Status: DC
  Administered 2023-11-10 – 2023-11-12 (×3): 10 mg via ORAL
  Filled 2023-11-10 (×3): qty 1

## 2023-11-10 MED ORDER — METOPROLOL TARTRATE 25 MG PO TABS
25.0000 mg | ORAL_TABLET | Freq: Two times a day (BID) | ORAL | Status: DC
  Administered 2023-11-10 (×2): 25 mg via ORAL
  Filled 2023-11-10 (×2): qty 1

## 2023-11-10 MED ORDER — ONDANSETRON HCL 4 MG/2ML IJ SOLN
4.0000 mg | Freq: Four times a day (QID) | INTRAMUSCULAR | Status: DC | PRN
  Administered 2023-11-10: 4 mg via INTRAVENOUS
  Filled 2023-11-10: qty 2

## 2023-11-10 MED ORDER — SORBITOL 70 % SOLN
30.0000 mL | Freq: Once | Status: AC
  Administered 2023-11-10: 30 mL via ORAL
  Filled 2023-11-10: qty 30

## 2023-11-10 NOTE — TOC Initial Note (Signed)
 Transition of Care [facility]) - Initial/Assessment Note    Patient Details  Name: [name] MRN: [id number] Date of Birth: [birth year]  [facility] CM/SW Contact:    Elliot Cousin, RN Phone Number: [phone] 11/10/2023, 9:46 AM  Clinical Narrative:                 TOC CM spoke to pt and states he was independent at home. Pt declines HH and does not feel DME is needed. Pt states his brother takes him to appts.   Expected Discharge Plan: Home/Self Care Barriers to Discharge: Continued Medical Work up   Patient Goals and CMS Choice Patient states their goals for this hospitalization and ongoing recovery are:: wants to recover          Expected Discharge Plan and Services   Discharge Planning Services: CM Consult   Living arrangements for the past 2 months: Single Family Home                                      Prior Living Arrangements/Services Living arrangements for the past 2 months: Single Family Home Lives with:: Spouse   Do you feel safe going back to the place where you live?: Yes      Need for Family Participation in Patient Care: No (Comment) Care giver support system in place?: Yes (comment)   Criminal Activity/Legal Involvement Pertinent to Current Situation/Hospitalization: No - Comment as needed  Activities of Daily Living      Permission Sought/Granted Permission sought to share information with : Case Manager Permission granted to share information with : Yes, Verbal Permission Granted  Share Information with NAME: [name]     Permission granted to share info w Relationship: wife  Permission granted to share info w Contact Information: [phone]  Emotional Assessment Appearance:: Appears stated age Attitude/Demeanor/Rapport: Engaged Affect (typically observed): Accepting Orientation: : Oriented to Self, Oriented to Place, Oriented to  Time, Oriented to Situation   Psych Involvement: No  (comment)  Admission diagnosis:  Coronary artery disease [I25.10] Patient Active Problem List   Diagnosis Date Noted   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   Coronary artery disease 11/08/2023   Loss of appetite for more than 2 weeks 06/18/2022   Weight loss 06/18/2022   Moderate mixed hyperlipidemia not requiring statin therapy 05/19/2022   Bilateral carotid bruits 11/08/2020   Ischemic chest pain (HCC) 11/10/2018   Primary hypertension 11/10/2018   HLD (hyperlipidemia) 11/10/2018   Anxiety 11/10/2018   Chest pain 11/10/2018   S/P drug eluting coronary stent placement 06/14/2017   Thrombocytopenia (HCC) 03/22/2017   Coronary artery disease of native artery of native heart with stable angina pectoris (HCC) 02/24/2017   Polycythemia 02/26/2016   Migraine headache with aura 01/01/2016   Primary insomnia 02/12/2015   PVC (premature ventricular contraction) 01/23/2015   PCP:  Wilford Corner, PA-C Pharmacy:   [facility] - [place], [place] - [address] [place] [address] Phone: [phone] Fax: [phone]     Social Drivers of Health (SDOH) Social History: SDOH Screenings   Food Insecurity: Patient Unable To Answer (11/08/2023)  Housing: Patient Unable To Answer (11/08/2023)  Transportation Needs: Patient Unable To Answer (11/08/2023)  Utilities: Patient Unable To Answer (11/08/2023)  Depression (PHQ2-9): Low Risk  (05/19/2022)  Financial Resource Strain: Low Risk  (01/07/2023)  [facility]  Physical Activity: Insufficiently Active (09/16/2017)   [facility] from [facility], [facility]  Social Connections: Somewhat Isolated (09/16/2017)   Received from [facility], [facility]  Stress: Stress Concern Present (09/16/2017)   Received from [facility], [facility]  Tobacco Use: Medium Risk  (11/08/2023)   SDOH Interventions:     Readmission Risk Interventions     No data to display

## 2023-11-10 NOTE — Plan of Care (Signed)
  Problem: Education: Goal: Knowledge of General Education information will improve Description: Including pain rating scale, medication(s)/side effects and non-pharmacologic comfort measures Outcome: Progressing   Problem: Clinical Measurements: Goal: Cardiovascular complication will be avoided Outcome: Progressing   Problem: Activity: Goal: Risk for activity intolerance will decrease Outcome: Progressing   Problem: Coping: Goal: Level of anxiety will decrease Outcome: Progressing   Problem: Pain Managment: Goal: General experience of comfort will improve and/or be controlled Outcome: Progressing   Problem: Skin Integrity: Goal: Risk for impaired skin integrity will decrease Outcome: Progressing   Problem: Cardiac: Goal: Will achieve and/or maintain hemodynamic stability Outcome: Progressing   Problem: Respiratory: Goal: Respiratory status will improve Outcome: Progressing

## 2023-11-10 NOTE — Progress Notes (Addendum)
 [address]       [name] [address]             [phone]      2 Days Post-Op Procedure(s) (LRB): CORONARY ARTERY BYPASS GRAFTING (CABG) TIMES THREE USING LEFT INTERNAL MAMMARY ARTERY AND ENDOSCOPICALLY HARVESTED RIGHT GREATER SAPHENOUS VEIN (N/A) TRANSESOPHAGEAL ECHOCARDIOGRAM (TEE) (N/A) Subjective: Patient sitting up in the chair. The patient states he has been nauseous with 1-2 episodes of vomiting. He states his pain is controlled this AM.  Objective: Vital signs in last 24 hours: Temp:  [97.7 F (36.5 C)-98.3 F (36.8 C)] 98.1 F (36.7 C) (02/19 0327) Pulse Rate:  [69-91] 91 (02/19 0600) Cardiac Rhythm: Normal sinus rhythm (02/19 0400) Resp:  [9-24] 16 (02/19 0600) BP: (107-164)/(68-119) 164/91 (02/19 0600) SpO2:  [86 %-100 %] 97 % (02/19 0600) Arterial Line BP: (122-130)/(56-62) 130/62 (02/18 0900) Weight:  [71.6 kg] 71.6 kg (02/19 0500)  Hemodynamic parameters for last 24 hours: CVP:  [7 mmHg-14 mmHg] 14 mmHg CO:  [8.3 L/min] 8.3 L/min CI:  [4.5 L/min/m2] 4.5 L/min/m2  Intake/Output from previous day: 02/18 0701 - 02/19 0700 In: 581.8 [P.O.:240; I.V.:42.1; IV Piggyback:299.7] Out: 1050 [Urine:800; Chest Tube:250] Intake/Output this shift: No intake/output data recorded.  General appearance: alert, cooperative, and no distress Neurologic: intact Heart: regular rate and rhythm, S1, S2 normal, no murmur, click, rub or gallop Lungs: diminished bibasilar breath sounds Abdomen: soft, non-tender; bowel sounds normal; no masses,  no organomegaly Extremities: edema trace Wound: Clean and dry EVH site, sternal site with clean and dry dressing in place  Lab Results: Recent Labs    11/09/23 1719 11/10/23 0453  WBC 15.9* 13.9*  HGB 11.7* 11.6*  HCT 34.1* 34.0*  PLT 195 162   BMET:  Recent Labs    11/09/23 1719 11/10/23 0453  NA 135 135  K 3.8 3.8  CL 101 100  CO2 26 26  GLUCOSE 157* 116*  BUN 9 11  CREATININE 0.92 0.72   CALCIUM 8.8* 8.9    PT/INR:  Recent Labs    11/08/23 1459  LABPROT 17.1*  INR 1.4*   ABG    Component Value Date/Time   PHART 7.314 (L) 11/08/2023 1934   HCO3 21.6 11/08/2023 1934   TCO2 23 11/08/2023 1934   ACIDBASEDEF 4.0 (H) 11/08/2023 1934   O2SAT 99 11/08/2023 1934   CBG (last 3)  Recent Labs    11/09/23 1951 11/09/23 2322 11/10/23 0329  GLUCAP 113* 125* 115*    Assessment/Plan: S/P Procedure(s) (LRB): CORONARY ARTERY BYPASS GRAFTING (CABG) TIMES THREE USING LEFT INTERNAL MAMMARY ARTERY AND ENDOSCOPICALLY HARVESTED RIGHT GREATER SAPHENOUS VEIN (N/A) TRANSESOPHAGEAL ECHOCARDIOGRAM (TEE) (N/A)  Neuro: Pain improved, continue current regimen.    CV:  HTN, SBP 150s this AM. Required IV Lopressor last night.  NSR with PVCs, 4 beat run of NSVT. HR 80s. Titrate Lopressor, was on 75mg  Toprol XL daily and Norvasc 10mg  daily at home. Home Plavix restarted, will transition to ASA 81mg .    Pulm: Saturating well on 2L [place]. CXR with bibasilar atelectasis, improved trace left apical pneumothorax. CT output 250cc/24hrs, 50cc last shift, no air leak. D/C CT. Encourage IS and ambulation. Wean oxygen as tolerated   GI: Nausea this AM with 1-2 episodes of vomiting. Passing flatus. Benign abdominal exam. Will give 4 more doses of Reglan, continue zofran prn and start compazine prn.    Endo: Preop A1C 4.7, no hx of DM. CBGs 113/125/115. Transition to AC/HS SSI  PRN   Renal: Cr 0.72. UO 800cc/24hrs recorded. Bladder scan last night with 220cc, may need to in and out cath for urinary retention, will get another bladder scan this AM. +2lbs from preop, down 4 lbs from yesterday after IV Lasix 40mg  yesterday. K 3.8, supplement. Start PO Lasix 40mg .   ID: Likely reactive leukocytosis trending down, tmax 98.3.    Expected postop ABLA: Improving H/H 11.6/34, not clinically significant at this time.    DVT Prophylaxis: Lovenox   PAD: Pre operative carotid duplex US showed right ICA are  consistent with a 80-99% stenosis and a left ICA are consistent with a 60-79% stenosis. Needs to see vascular surgery as an outpatient.   Dispo: Possible transfer to 4E   LOS: 2 days    Jenny Reichmann, PA-C 11/10/2023  Agree with above Floor today  Corliss Skains

## 2023-11-10 NOTE — Progress Notes (Signed)
 NAME:  [name], MRN:  [id number], DOB:  09/20/1966, LOS: 2 ADMISSION DATE:  11/08/2023, CONSULTATION DATE:  11/08/23 REFERRING MD:  Dr. Cliffton Asters CHIEF COMPLAINT:  CAD with unstable angina   History of Present Illness:  [name] is a 58 year old male who has been followed by [facility] cardiology for his CAD for some time (at least since 2018). He has had several caths with PCI to the prox and mid RCA in 2011, in-stent restenosis of the mid RCA in 2018, and prox left circ in 2020.   He was asymptomatic until about 12/2022 when be began experiencing intermittent chest pain. This progressed until 10/26/2023 when he was experiencing left sided exertional chest pain  that relieves with rest and rest pain about once per week. Cardiac cath 2/5 showed 3 vessel disease with 80% prox LAD, 80% OM1, and occluded RCA stents. Echo showed preserved biventricular function with no significant valvular disease. CT surgery was consulted and CABG was scheduled for 2/17.   Three vessel CABG was performed with LIMA to LAD, SVG to OM1, and SVG to PDA.   2L LR given intraop Pump Time: 83 min EBL:610   Pertinent  Medical History  CAD s/p PCI to the proximal and mid RCA 2011, mid RCA 2018, and left circ 2020. Hypertension, hyperlipidemia, alcohol abuse, former smoker.   Significant Hospital Events: Including procedures, antibiotic start and stop dates in addition to other pertinent events   2/17 elective CABG x3, extubated in the early evening 2/18 requiring as needed morphine overnight total of 10 mg from midnight to 630, not on any pressors or vasoactive infusions 2/19: NAEON, CT pulled today, moving out of ICU   Interim History / Subjective:  NAEON. Difficulty with nausea and adding compazine. CT pulled this morning without incident. Starting PO lasix. To go out to 4E today.   Objective   Blood pressure (!) 164/91, pulse 91, temperature 97.6 F (36.4 C), temperature source Oral, resp. rate 16, height 5\' 10"   (1.778 m), weight 71.6 kg, SpO2 97%.        Intake/Output Summary (Last 24 hours) at 11/10/2023 1021 Last data filed at 11/10/2023 0900 Gross per 24 hour  Intake 419.69 ml  Output 1480 ml  Net -1060.31 ml   Filed Weights   11/08/23 0752 11/09/23 0500 11/10/23 0500  Weight: 70.3 kg 73.2 kg 71.6 kg    Examination: General: middle aged male, laying in bed, no acute distress  HEENT: anicteric sclera, mmm, 3LNC  Pulmonary: even and unlabored, 3LNC, no rales, rhonchi or wheezing. CT pulled  Cardiac: s1s2, no gallop or murmur, no pitting edema Abd: flat, soft, non-tender, non-distended  Ext: warm, dry, no pitting edema  GU: no foley Neuro: alert and oriented, non-focal exam  Resolved Hospital Problem list   Post-op vent management   Assessment & Plan:  CAD s/p CABG x3 2/17 - post-op management per TCTS  - remove CT today  - mobilize, IS  - continuing multimodal pain control, surgical team has added Robaxin and Toradol - con't DAPT with ASA, Plavix  - Lasix 40mg  PO daily  - con't pravastatin 20mg  daily - metoprolol 25mg  BID and PRN  - LMWH ppx - ACHS SSI  - tele monitoring   Post-operative nausea  - add compazine  - prn zofran  - scheduled reglan   Expected postoperative anemia - trend  - transfuse <8 or active bleed   Best Practice (right click and "Reselect all SmartList Selections" daily)  Diet/type: Regular consistency (see orders) DVT prophylaxis LMWH Pressure ulcer(s): N/A GI prophylaxis: PPI Lines: Central line and yes and it is still needed Foley:  N/A Code Status:  full code Last date of multidisciplinary goals of care discussion [per primary]   Critical care time: NA     Cristopher Peru, PA-C  Pulmonary & Critical Care 11/10/23 10:29 AM  Please see [email or web address] for pager details.  From 7A-7P if no response, please call [phone] After hours, please call ELink [phone]

## 2023-11-11 LAB — GLUCOSE, CAPILLARY: Glucose-Capillary: 103 mg/dL — ABNORMAL HIGH (ref 70–99)

## 2023-11-11 LAB — BASIC METABOLIC PANEL
Anion gap: 12 (ref 5–15)
BUN: 13 mg/dL (ref 6–20)
CO2: 25 mmol/L (ref 22–32)
Calcium: 8.7 mg/dL — ABNORMAL LOW (ref 8.9–10.3)
Chloride: 99 mmol/L (ref 98–111)
Creatinine, Ser: 0.83 mg/dL (ref 0.61–1.24)
GFR, Estimated: >60 mL/min (ref >60)
Glucose, Bld: 123 mg/dL — ABNORMAL HIGH (ref 70–99)
Potassium: 3.3 mmol/L — ABNORMAL LOW (ref 3.5–5.1)
Sodium: 136 mmol/L (ref 135–145)

## 2023-11-11 LAB — CBC
HCT: 29.7 % — ABNORMAL LOW (ref 39.0–52.0)
Hemoglobin: 10.7 g/dL — ABNORMAL LOW (ref 13.0–17.0)
MCH: 32.1 pg (ref 26.0–34.0)
MCHC: 36 g/dL (ref 30.0–36.0)
MCV: 89.2 fL (ref 80.0–100.0)
Platelets: 130 10*3/uL — ABNORMAL LOW (ref 150–400)
RBC: 3.33 MIL/uL — ABNORMAL LOW (ref 4.22–5.81)
RDW: 12.9 % (ref 11.5–15.5)
WBC: 8.2 10*3/uL (ref 4.0–10.5)
nRBC: 0 % (ref 0.0–0.2)

## 2023-11-11 MED ORDER — POTASSIUM CHLORIDE CRYS ER 20 MEQ PO TBCR
40.0000 meq | EXTENDED_RELEASE_TABLET | Freq: Three times a day (TID) | ORAL | Status: AC
  Administered 2023-11-11 (×3): 40 meq via ORAL
  Filled 2023-11-11 (×3): qty 2

## 2023-11-11 MED ORDER — AMIODARONE HCL 200 MG PO TABS
400.0000 mg | ORAL_TABLET | Freq: Two times a day (BID) | ORAL | Status: DC
  Administered 2023-11-11 – 2023-11-12 (×3): 400 mg via ORAL
  Filled 2023-11-11 (×3): qty 2

## 2023-11-11 MED ORDER — POTASSIUM CHLORIDE CRYS ER 20 MEQ PO TBCR: 40.0000 meq | EXTENDED_RELEASE_TABLET | Freq: Three times a day (TID) | ORAL | Status: DC

## 2023-11-11 MED ORDER — METOPROLOL SUCCINATE ER 50 MG PO TB24
75.0000 mg | ORAL_TABLET | Freq: Every day | ORAL | Status: DC
  Administered 2023-11-11 – 2023-11-12 (×2): 75 mg via ORAL
  Filled 2023-11-11 (×3): qty 1

## 2023-11-11 NOTE — Progress Notes (Signed)
 Mobility Specialist Progress Note:    11/11/23 1536  Mobility  Activity Ambulated independently in hallway  Level of Assistance Standby assist, set-up cues, supervision of patient - no hands on  Assistive Device None  Distance Ambulated (ft) 270 ft  RUE Weight Bearing Per Provider Order NWB  LUE Weight Bearing Per Provider Order NWB  Activity Response Tolerated well  Mobility Referral Yes  Mobility Specialist Start Time (ACUTE ONLY) 1530  Mobility Specialist Stop Time (ACUTE ONLY) 1536  Mobility Specialist Time Calculation (min) (ACUTE ONLY) 6 min   Pt received in bed, agreeable to mobility session. Ambulated in hallway with SBA, no AD required. Tolerated well, asx throughout. Returned pt to room, all needs met.   Feliciana Rossetti Mobility Specialist Please contact via Special educational needs teacher or  Rehab office at [phone]

## 2023-11-11 NOTE — Progress Notes (Signed)
 CARDIAC REHAB PHASE I   PRE:  Rate/Rhythm: 83 NSR  BP:  Sitting: 131/70      SpO2: 94 RA  MODE:  Ambulation: 200 ft    POST:  Rate/Rhythm: 91 NSR  BP:  Sitting: 139/75      SpO2: 94 RA  Pt able to demonstrate successful transition from bed to standing while following sternal precautions w/o assistance. Pt walked in hallway w/o AD with standby assistance. Pt walked at slow pace with irregular intervals of downward head tilt to look at feet placement. Pt overall steady and has RW at home to use if needed. Pt returned back to bed and denies any needs.  Pt will be referred to CR rehab at [facility]. Will place referral.   Faustino Congress  MS, ACSM-CEP 9:02 AM 11/11/2023    Service time is from 0840 to 0902.

## 2023-11-11 NOTE — Progress Notes (Addendum)
 [address]       [place] [address]             [phone]      3 Days Post-Op Procedure(s) (LRB): CORONARY ARTERY BYPASS GRAFTING (CABG) TIMES THREE USING LEFT INTERNAL MAMMARY ARTERY AND ENDOSCOPICALLY HARVESTED RIGHT GREATER SAPHENOUS VEIN (N/A) TRANSESOPHAGEAL ECHOCARDIOGRAM (TEE) (N/A) Subjective: Patient states his nausea is gone and he is ready to go home.   Objective: Vital signs in last 24 hours: Temp:  [97.6 F (36.4 C)-98.8 F (37.1 C)] 98.4 F (36.9 C) (02/20 0425) Pulse Rate:  [79-113] 87 (02/20 0425) Cardiac Rhythm: Normal sinus rhythm (02/19 1923) Resp:  [16-27] 21 (02/20 0614) BP: (106-163)/(60-104) 144/75 (02/20 0425) SpO2:  [93 %-100 %] 100 % (02/20 0425) Weight:  [69.9 kg] 69.9 kg (02/20 0614)  Hemodynamic parameters for last 24 hours:    Intake/Output from previous day: 02/19 0701 - 02/20 0700 In: 1047.7 [P.O.:500; I.V.:385.3; IV Piggyback:162.4] Out: 1550 [Urine:1550] Intake/Output this shift: No intake/output data recorded.  General appearance: alert, cooperative, and no distress Neurologic: intact Heart: regular rate and rhythm, S1, S2 normal, no murmur, click, rub or gallop Lungs: diminished bibasilar breath sounds Abdomen: soft, non-tender; bowel sounds normal; no masses,  no organomegaly Extremities: extremities normal, atraumatic, no cyanosis or edema Wound: Clean and dry without sign of infection  Lab Results: Recent Labs    11/10/23 0453 11/11/23 0211  WBC 13.9* 8.2  HGB 11.6* 10.7*  HCT 34.0* 29.7*  PLT 162 130*   BMET:  Recent Labs    11/10/23 0453 11/11/23 0211  NA 135 136  K 3.8 3.3*  CL 100 99  CO2 26 25  GLUCOSE 116* 123*  BUN 11 13  CREATININE 0.72 0.83  CALCIUM 8.9 8.7*    PT/INR:  Recent Labs    11/08/23 1459  LABPROT 17.1*  INR 1.4*   ABG    Component Value Date/Time   PHART 7.314 (L) 11/08/2023 1934   HCO3 21.6 11/08/2023 1934   TCO2 23 11/08/2023 1934   ACIDBASEDEF 4.0  (H) 11/08/2023 1934   O2SAT 99 11/08/2023 1934   CBG (last 3)  Recent Labs    11/10/23 1554 11/10/23 2104 11/11/23 0612  GLUCAP 125* 113* 103*    Assessment/Plan: S/P Procedure(s) (LRB): CORONARY ARTERY BYPASS GRAFTING (CABG) TIMES THREE USING LEFT INTERNAL MAMMARY ARTERY AND ENDOSCOPICALLY HARVESTED RIGHT GREATER SAPHENOUS VEIN (N/A) TRANSESOPHAGEAL ECHOCARDIOGRAM (TEE) (N/A)  Neuro: Pain controlled this AM   CV: Patient went into atrial fibrillation with RVR yesterday, on Amiodarone gtt. NSR, HR 90 this AM. Will transition to PO Amiodarone. SBP 140s this AM on Lopressor 25mg  BID and Norvasc 10mg  daily. Will restart home Toprol XL 75mg . Home Plavix restarted, on ASA 81mg .    Pulm: Saturating well on RA. CXR with bibasilar atelectasis, small left pleural effusion and infrahilar atelectasis. Encourage IS and ambulation. Hx of tobacco abuse, recommend cessation. Follow up with PCP for lung cancer screening.    GI: Nausea resolved, +BM. Lack of appetite.    Endo: Preop A1C 4.7, no hx of DM. CBGs 125/113/103. Will d/c SSI and CBGs.    Renal: Cr 0.83. UO 1550cc/24hrs recorded. Some urinary retention yesterday that resolved with flomax and diuresis. Under preop weight. K 3.3 after 2 runs of potassium yesterday, supplement. Continue PO Lasix 40mg  for pleural effusion.   ID: Likely reactive leukocytosis resolved, tmax 98.3.    Expected postop ABLA: Overall stable, H/H 10.7/29.7, not clinically significant  at this time. Likely reactive thrombocytopenia, plt 130,000.    DVT Prophylaxis: Lovenox   PAD: Pre operative carotid duplex US showed right ICA are consistent with a 80-99% stenosis and a left ICA are consistent with a 60-79% stenosis. Has a vascular surgery appointment arranged.  Dispo: Transition to PO Amiodarone today. Hopefully if rate and rhythm remain stable can d/c next 24 hours.     LOS: 3 days    Jenny Reichmann, PA-C 11/11/2023   Agree with above PO amio Dispo  planning  Valari Taylor O Jeanett Antonopoulos

## 2023-11-11 NOTE — Plan of Care (Signed)
 Problem: Education: Goal: Knowledge of General Education information will improve Description: Including pain rating scale, medication(s)/side effects and non-pharmacologic comfort measures 11/11/2023 0220 by Carl Ape, RN Outcome: Progressing 11/11/2023 0220 by Carl Ape, RN Outcome: Progressing   Problem: Health Behavior/Discharge Planning: Goal: Ability to manage health-related needs will improve 11/11/2023 0220 by Carl Ape, RN Outcome: Progressing 11/11/2023 0220 by Carl Ape, RN Outcome: Progressing   Problem: Clinical Measurements: Goal: Ability to maintain clinical measurements within normal limits will improve 11/11/2023 0220 by Carl Ape, RN Outcome: Progressing 11/11/2023 0220 by Carl Ape, RN Outcome: Progressing Goal: Will remain free from infection 11/11/2023 0220 by Carl Ape, RN Outcome: Progressing 11/11/2023 0220 by Carl Ape, RN Outcome: Progressing Goal: Diagnostic test results will improve 11/11/2023 0220 by Carl Ape, RN Outcome: Progressing 11/11/2023 0220 by Carl Ape, RN Outcome: Progressing Goal: Respiratory complications will improve 11/11/2023 0220 by Carl Ape, RN Outcome: Progressing 11/11/2023 0220 by Carl Ape, RN Outcome: Progressing Goal: Cardiovascular complication will be avoided 11/11/2023 0220 by Carl Ape, RN Outcome: Progressing 11/11/2023 0220 by Carl Ape, RN Outcome: Progressing   Problem: Activity: Goal: Risk for activity intolerance will decrease 11/11/2023 0220 by Carl Ape, RN Outcome: Progressing 11/11/2023 0220 by Carl Ape, RN Outcome: Progressing   Problem: Nutrition: Goal: Adequate nutrition will be maintained 11/11/2023 0220 by Carl Ape, RN Outcome: Progressing 11/11/2023 0220 by Carl Ape, RN Outcome: Progressing   Problem: Coping: Goal: Level of anxiety will decrease 11/11/2023  0220 by Carl Ape, RN Outcome: Progressing 11/11/2023 0220 by Carl Ape, RN Outcome: Progressing   Problem: Elimination: Goal: Will not experience complications related to bowel motility 11/11/2023 0220 by Carl Ape, RN Outcome: Progressing 11/11/2023 0220 by Carl Ape, RN Outcome: Progressing Goal: Will not experience complications related to urinary retention 11/11/2023 0220 by Carl Ape, RN Outcome: Progressing 11/11/2023 0220 by Carl Ape, RN Outcome: Progressing   Problem: Pain Managment: Goal: General experience of comfort will improve and/or be controlled 11/11/2023 0220 by Carl Ape, RN Outcome: Progressing 11/11/2023 0220 by Carl Ape, RN Outcome: Progressing   Problem: Safety: Goal: Ability to remain free from injury will improve 11/11/2023 0220 by Carl Ape, RN Outcome: Progressing 11/11/2023 0220 by Carl Ape, RN Outcome: Progressing   Problem: Skin Integrity: Goal: Risk for impaired skin integrity will decrease 11/11/2023 0220 by Carl Ape, RN Outcome: Progressing 11/11/2023 0220 by Carl Ape, RN Outcome: Progressing   Problem: Education: Goal: Will demonstrate proper wound care and an understanding of methods to prevent future damage 11/11/2023 0220 by Carl Ape, RN Outcome: Progressing 11/11/2023 0220 by Carl Ape, RN Outcome: Progressing Goal: Knowledge of disease or condition will improve 11/11/2023 0220 by Carl Ape, RN Outcome: Progressing 11/11/2023 0220 by Carl Ape, RN Outcome: Progressing Goal: Knowledge of the prescribed therapeutic regimen will improve 11/11/2023 0220 by Carl Ape, RN Outcome: Progressing 11/11/2023 0220 by Carl Ape, RN Outcome: Progressing Goal: Individualized Educational Video(s) 11/11/2023 0220 by Carl Ape, RN Outcome: Progressing 11/11/2023 0220 by Carl Ape, RN Outcome:  Progressing   Problem: Activity: Goal: Risk for activity intolerance will decrease 11/11/2023 0220 by Carl Ape, RN Outcome: Progressing 11/11/2023 0220 by Carl Ape, RN Outcome: Progressing   Problem: Cardiac: Goal: Will achieve and/or maintain hemodynamic stability 11/11/2023 0220 by Carl Ape, RN Outcome: Progressing 11/11/2023 0220 by Carl Ape, RN Outcome: Progressing   Problem: Clinical Measurements: Goal: Postoperative complications will be avoided or minimized 11/11/2023 0220 by Carl Ape, RN Outcome: Progressing 11/11/2023 0220 by Carl Ape, RN Outcome: Progressing   Problem:  Respiratory: Goal: Respiratory status will improve 11/11/2023 0220 by Carl Ape, RN Outcome: Progressing 11/11/2023 0220 by Carl Ape, RN Outcome: Progressing   Problem: Skin Integrity: Goal: Wound healing without signs and symptoms of infection 11/11/2023 0220 by Carl Ape, RN Outcome: Progressing 11/11/2023 0220 by Carl Ape, RN Outcome: Progressing Goal: Risk for impaired skin integrity will decrease 11/11/2023 0220 by Carl Ape, RN Outcome: Progressing 11/11/2023 0220 by Carl Ape, RN Outcome: Progressing   Problem: Urinary Elimination: Goal: Ability to achieve and maintain adequate renal perfusion and functioning will improve 11/11/2023 0220 by Carl Ape, RN Outcome: Progressing 11/11/2023 0220 by Carl Ape, RN Outcome: Progressing   Problem: Education: Goal: Ability to describe self-care measures that may prevent or decrease complications (Diabetes Survival Skills Education) will improve 11/11/2023 0220 by Carl Ape, RN Outcome: Progressing 11/11/2023 0220 by Carl Ape, RN Outcome: Progressing Goal: Individualized Educational Video(s) 11/11/2023 0220 by Carl Ape, RN Outcome: Progressing 11/11/2023 0220 by Carl Ape, RN Outcome: Progressing    Problem: Coping: Goal: Ability to adjust to condition or change in health will improve 11/11/2023 0220 by Carl Ape, RN Outcome: Progressing 11/11/2023 0220 by Carl Ape, RN Outcome: Progressing   Problem: Fluid Volume: Goal: Ability to maintain a balanced intake and output will improve 11/11/2023 0220 by Carl Ape, RN Outcome: Progressing 11/11/2023 0220 by Carl Ape, RN Outcome: Progressing   Problem: Health Behavior/Discharge Planning: Goal: Ability to identify and utilize available resources and services will improve 11/11/2023 0220 by Carl Ape, RN Outcome: Progressing 11/11/2023 0220 by Carl Ape, RN Outcome: Progressing Goal: Ability to manage health-related needs will improve 11/11/2023 0220 by Carl Ape, RN Outcome: Progressing 11/11/2023 0220 by Carl Ape, RN Outcome: Progressing   Problem: Metabolic: Goal: Ability to maintain appropriate glucose levels will improve 11/11/2023 0220 by Carl Ape, RN Outcome: Progressing 11/11/2023 0220 by Carl Ape, RN Outcome: Progressing   Problem: Nutritional: Goal: Maintenance of adequate nutrition will improve 11/11/2023 0220 by Carl Ape, RN Outcome: Progressing 11/11/2023 0220 by Carl Ape, RN Outcome: Progressing Goal: Progress toward achieving an optimal weight will improve 11/11/2023 0220 by Carl Ape, RN Outcome: Progressing 11/11/2023 0220 by Carl Ape, RN Outcome: Progressing   Problem: Skin Integrity: Goal: Risk for impaired skin integrity will decrease Outcome: Progressing   Problem: Tissue Perfusion: Goal: Adequacy of tissue perfusion will improve Outcome: Progressing

## 2023-11-12 ENCOUNTER — Other Ambulatory Visit (HOSPITAL_COMMUNITY): Payer: Self-pay

## 2023-11-12 LAB — BASIC METABOLIC PANEL
Anion gap: 11 (ref 5–15)
BUN: 11 mg/dL (ref 6–20)
CO2: 25 mmol/L (ref 22–32)
Calcium: 8.8 mg/dL — ABNORMAL LOW (ref 8.9–10.3)
Chloride: 102 mmol/L (ref 98–111)
Creatinine, Ser: 0.83 mg/dL (ref 0.61–1.24)
GFR, Estimated: >60 mL/min (ref >60)
Glucose, Bld: 103 mg/dL — ABNORMAL HIGH (ref 70–99)
Potassium: 4.2 mmol/L (ref 3.5–5.1)
Sodium: 138 mmol/L (ref 135–145)

## 2023-11-12 MED ORDER — TAMSULOSIN HCL 0.4 MG PO CAPS
0.4000 mg | ORAL_CAPSULE | Freq: Every day | ORAL | 1 refills | Status: DC
  Filled 2023-11-12: qty 30, 30d supply, fill #0

## 2023-11-12 MED ORDER — FUROSEMIDE 40 MG PO TABS
40.0000 mg | ORAL_TABLET | Freq: Every day | ORAL | 0 refills | Status: DC
  Filled 2023-11-12: qty 4, 4d supply, fill #0

## 2023-11-12 MED ORDER — PRAVASTATIN SODIUM 20 MG PO TABS
20.0000 mg | ORAL_TABLET | Freq: Every day | ORAL | 1 refills | Status: DC
  Filled 2023-11-12: qty 30, 30d supply, fill #0

## 2023-11-12 MED ORDER — AMIODARONE HCL 200 MG PO TABS
ORAL_TABLET | ORAL | 1 refills | Status: DC
  Filled 2023-11-12: qty 60, 32d supply, fill #0

## 2023-11-12 MED ORDER — POTASSIUM CHLORIDE CRYS ER 20 MEQ PO TBCR
20.0000 meq | EXTENDED_RELEASE_TABLET | Freq: Every day | ORAL | 0 refills | Status: DC
  Filled 2023-11-12: qty 4, 4d supply, fill #0

## 2023-11-12 MED ORDER — METOPROLOL SUCCINATE ER 100 MG PO TB24: 100.0000 mg | ORAL_TABLET | Freq: Every day | ORAL | Status: DC

## 2023-11-12 MED ORDER — OXYCODONE HCL 5 MG PO TABS
5.0000 mg | ORAL_TABLET | Freq: Four times a day (QID) | ORAL | 0 refills | Status: DC | PRN
  Filled 2023-11-12: qty 28, 7d supply, fill #0

## 2023-11-12 MED ORDER — METOPROLOL SUCCINATE ER 100 MG PO TB24
100.0000 mg | ORAL_TABLET | Freq: Every day | ORAL | 1 refills | Status: AC
  Filled 2023-11-12: qty 30, 30d supply, fill #0

## 2023-11-12 NOTE — Progress Notes (Signed)
 Discharge instructions reviewed with pt and his brother.  Copy of instructions given to pt. [facility] has filled his scripts and will be picked up on the way out for discharge.  Pt will be d/c'd via wheelchair with belongings with his brother and will be           escorted by hospital volunteer.   Nihal Doan,RN SWOT

## 2023-11-12 NOTE — TOC Transition Note (Signed)
 Transition of Care (TOC) - Discharge Note Donn Pierini RN, BSN Transitions of Care Unit 4E- RN Case Manager See Treatment Team for direct phone #   Patient Details  Name: [name] MRN: [id number] Date of Birth: 1966/03/04  Transition [facility]) CM/SW Contact:  Darrold Span, RN Phone Number: 11/12/2023, 10:14 AM   Clinical Narrative:    Pt stable for transition home today, no HH or DME needs noted. Wife to transport home. Pt to follow up as per AVS instructions.    Final next level of care: Home/Self Care Barriers to Discharge: Barriers Resolved   Patient Goals and CMS Choice Patient states their goals for this hospitalization and ongoing recovery are:: wants to recover   Choice offered to / list presented to : NA      Discharge Placement               Home        Discharge Plan and Services Additional resources added to the After Visit Summary for     Discharge Planning Services: CM Consult Post Acute Care Choice: NA          DME Arranged: N/A DME Agency: NA       HH Arranged: NA HH Agency: NA        Social Drivers of Health (SDOH) Interventions SDOH Screenings   Food Insecurity: Patient Unable To Answer (11/08/2023)  Housing: Patient Unable To Answer (11/08/2023)  Transportation Needs: Patient Unable To Answer (11/08/2023)  Utilities: Patient Unable To Answer (11/08/2023)  Depression (PHQ2-9): Low Risk  (05/19/2022)  Financial Resource Strain: Low Risk  (01/07/2023)   Received from [facility], [facility]  Physical Activity: Insufficiently Active (09/16/2017)   Received from [facility], [facility]  Social Connections: Somewhat Isolated (09/16/2017)   Received from [facility], [facility]  Stress: Stress Concern Present (09/16/2017)   Received from [facility], [facility]  Tobacco Use:  Medium Risk (11/08/2023)     Readmission Risk Interventions    11/12/2023   10:14 AM  Readmission Risk Prevention Plan  Post Dischage Appt Complete  Medication Screening Complete  Transportation Screening Complete

## 2023-11-12 NOTE — Progress Notes (Cosign Needed Addendum)
      [address]       [place] [address]             [phone]        4 Days Post-Op Procedure(s) (LRB): CORONARY ARTERY BYPASS GRAFTING (CABG) TIMES THREE USING LEFT INTERNAL MAMMARY ARTERY AND ENDOSCOPICALLY HARVESTED RIGHT GREATER SAPHENOUS VEIN (N/A) TRANSESOPHAGEAL ECHOCARDIOGRAM (TEE) (N/A)  Subjective: Patient without complaints this am;he wants to go home.  Objective: Vital signs in last 24 hours: Temp:  [98.1 F (36.7 C)-99.2 F (37.3 C)] 98.6 F (37 C) (02/21 0329) Pulse Rate:  [77-91] 85 (02/21 0329) Cardiac Rhythm: Normal sinus rhythm (02/20 1945) Resp:  [17-21] 20 (02/21 0329) BP: (131-138)/(70-78) 138/71 (02/21 0329) SpO2:  [90 %-94 %] 92 % (02/21 0329) Weight:  [69.2 kg] 69.2 kg (02/21 0329)  Pre op weight 70.3 kg Current Weight  11/12/23 69.2 kg      Intake/Output from previous day: 02/20 0701 - 02/21 0700 In: 420 [P.O.:420] Out: -    Physical Exam:  Cardiovascular: RRR Pulmonary: Slightly diminished left base;otherwise, clear Abdomen: Soft, non tender, bowel sounds present. Extremities: Trace bilateral lower extremity edema. Mild ecchymosis right thigh Wounds: Clean and dry.  No erythema or signs of infection.  Lab Results: CBC: Recent Labs    11/10/23 0453 11/11/23 0211  WBC 13.9* 8.2  HGB 11.6* 10.7*  HCT 34.0* 29.7*  PLT 162 130*   BMET:  Recent Labs    11/11/23 0211 11/12/23 0306  NA 136 138  K 3.3* 4.2  CL 99 102  CO2 25 25  GLUCOSE 123* 103*  BUN 13 11  CREATININE 0.83 0.83  CALCIUM 8.7* 8.8*    PT/INR:  Lab Results  Component Value Date   INR 1.4 (H) 11/08/2023   INR 1.0 11/04/2023   INR 0.97 11/10/2018   ABG:  INR: Will add last result for INR, ABG once components are confirmed Will add last 4 CBG results once components are confirmed  Assessment/Plan:  1. CV - Previous a fib with RVR. Amiodarone drip stopped 02/20. He has been maintaining SR. On Amiodarone 400 mg bid, Amlodipine 10  mg daily, and and Toprol XL 75 mg daily. 2.  Pulmonary - On room air. Encourage incentive spirometer. 3. Above pre op weight, requires further diuresis with Lasix 40 mg daily 4.  Expected post op acute blood loss anemia - Last H and H 10.7 and 29.7 5. DM-CBGs 125/113/103. Pre op HGA1C 4.7.  Stop accu checks and SS PRn. 6. Mild thrombocytopenia-last platelets 130,000 7. Previous urinary retention resolved-continue Flomax 8. As discussed with Dr. Cliffton Asters, discharge  Carl Foti M ZimmermanPA-C 6:57 AM

## 2023-11-12 NOTE — Progress Notes (Signed)
 CARDIAC REHAB PHASE I   Pt ready for discharge home. Post [facility] education completed. Referral sent to [facility].     Woodroe Chen, RN BSN 11/12/2023 10:24 AM

## 2023-11-15 MED FILL — Sodium Chloride IV Soln 0.9%: INTRAVENOUS | Qty: 2000 | Status: AC

## 2023-11-15 MED FILL — Electrolyte-R (PH 7.4) Solution: INTRAVENOUS | Qty: 5000 | Status: CN

## 2023-11-15 MED FILL — Lidocaine HCl Local Preservative Free (PF) Inj 2%: INTRAMUSCULAR | Qty: 15 | Status: AC

## 2023-11-15 MED FILL — Calcium Chloride Inj 10%: INTRAVENOUS | Qty: 10 | Status: AC

## 2023-11-15 MED FILL — Heparin Sodium (Porcine) Inj 1000 Unit/ML: Qty: 1000 | Status: AC

## 2023-11-15 MED FILL — Electrolyte-R (PH 7.4) Solution: INTRAVENOUS | Qty: 5000 | Status: AC

## 2023-11-15 MED FILL — Potassium Chloride Inj 2 mEq/ML: INTRAVENOUS | Qty: 40 | Status: AC

## 2023-11-15 MED FILL — Sodium Bicarbonate IV Soln 8.4%: INTRAVENOUS | Qty: 50 | Status: AC

## 2023-11-15 MED FILL — Mannitol IV Soln 20%: INTRAVENOUS | Qty: 500 | Status: AC

## 2023-11-15 MED FILL — Calcium Chloride Inj 10%: INTRAVENOUS | Qty: 10 | Status: CN

## 2023-11-15 MED FILL — Sodium Chloride IV Soln 0.9%: INTRAVENOUS | Qty: 300 | Status: CN

## 2023-11-15 MED FILL — Sodium Bicarbonate IV Soln 8.4%: INTRAVENOUS | Qty: 50 | Status: CN

## 2023-11-19 ENCOUNTER — Ambulatory Visit: Payer: 59 | Admitting: Thoracic Surgery (Cardiothoracic Vascular Surgery)

## 2023-11-19 DIAGNOSIS — Z951 Presence of aortocoronary bypass graft: Secondary | ICD-10-CM

## 2023-11-19 NOTE — Progress Notes (Signed)
     [address]       [name] [address]             [phone]       Patient: Home Provider: Office Consent for Telemedicine visit obtained.  Today's visit was completed via a real-time telehealth (see specific modality noted below). The patient/authorized person provided oral consent at the time of the visit to engage in a telemedicine encounter with the present provider at [facility]. The patient/authorized person was informed of the potential benefits, limitations, and risks of telemedicine. The patient/authorized person expressed understanding that the laws that protect confidentiality also apply to telemedicine. The patient/authorized person acknowledged understanding that telemedicine does not provide emergency services and that he or she would need to call 911 or proceed to the nearest hospital for help if such a need arose.   Total time spent in the clinical discussion 10 minutes.  Telehealth Modality: Phone visit (audio only)  I had a telephone visit with  [name] who is s/p CABG.  Overall doing well.  Pain is minimal.  Ambulating well. Vitals have been stable.  [name] will see [place] back in 1 month with a chest x-ray for cardiac rehab clearance.  Milinda Sweeney Keane Scrape

## 2023-12-02 NOTE — Progress Notes (Signed)
 [address]       [name] [address]             [phone]   HPI: This is a 58 year old who is s/p CABG X 3 (LIMA LAD, RSVG PDA, RSVG to OM, with endoscopic greater saphenous vein harvest on the right by Dr. Cliffton Asters on 11/08/2023. He was discharged on 11/12/2023. He had a virtual appointment with Dr. Cliffton Asters on 11/19/2023 and he was doing well at that time. He presents today for in person post op follow up. He denies chest pain or shortness of breath.  Current Outpatient Medications  Medication Sig Dispense Refill   amiodarone (PACERONE) 200 MG tablet Take 2 tablets (400mg ) twice per day for 7 days, then take 1 tablet (200mg ) twice per day for 7 days, then take 1 tablet (200mg ) daily thereafter 60 tablet 1   amLODipine (NORVASC) 10 MG tablet Take 10 mg by mouth daily.     aspirin 81 MG tablet Take 81 mg by mouth daily.      clopidogrel (PLAVIX) 75 MG tablet Take 1 tablet (75 mg total) by mouth daily. 90 tablet 1   Coenzyme Q10 100 MG TABS Take 100 mg by mouth daily.     furosemide (LASIX) 40 MG tablet Take 1 tablet (40 mg total) by mouth daily. For 4 days then stop 4 tablet 0   loratadine (CLARITIN) 10 MG tablet Take 1 tablet (10 mg total) by mouth daily. 90 tablet 1   metoprolol succinate (TOPROL-XL) 100 MG 24 hr tablet Take 1 tablet (100 mg total) by mouth daily. Take with or immediately following a meal. 30 tablet 1   Omega-3 Fatty Acids (FISH OIL PO) Take 690 mg by mouth daily.     oxyCODONE (OXY IR/ROXICODONE) 5 MG immediate release tablet Take 1 tablet (5 mg total) by mouth every 6 (six) hours as needed for severe pain (pain score 7-10). 28 tablet 0   potassium chloride SA (KLOR-CON M) 20 MEQ tablet Take 1 tablet (20 mEq total) by mouth daily. For 4 days then stop 4 tablet 0   pravastatin (PRAVACHOL) 20 MG tablet Take 1 tablet (20 mg total) by mouth at bedtime. 30 tablet 1   tamsulosin (FLOMAX) 0.4 MG CAPS capsule Take 1 capsule (0.4 mg total) by mouth daily.  30 capsule 1  Vital Signs: Vitals:   12/16/23 1444  BP: 132/80  Pulse: 72  Resp: 20  SpO2: 96%      Physical Exam: CV-RRR Pulmonary-Slightly diminished breath sounds left base and tight lung is clear Abdomen-Soft, non tender, bowel sounds present Extremities-no LE edema Wounds-Sternal and RLE wounds are clean, dry well healed with no sign of infection  Diagnostic Tests: CLINICAL DATA:  CABG, follow-up   EXAM: CHEST - 2 VIEW   COMPARISON:  11/10/2023   FINDINGS: Suspect moderate left pleural effusion. No confluent airspace opacities bilaterally. Heart and mediastinal contours are within normal limits. Prior CABG.   IMPRESSION: Left sub pulmonic pleural effusion.     Electronically Signed   By: Charlett Nose M.D.   On: 12/16/2023 14:18  Impression and Plan: We discussed today's chest xray findings (moderate left pleural effusion). Treatment options include Lasix and potassium supplement or a left thoracentesis. He prefers diuretic with potassium supplement, which is reasonable as he has good oxygenation and is not symptomatic. We also discussed sternal precautions, driving (as he is no longer taking narcotics), and participation in cardiac rehab (he is  agreeable). He had follow up with Dr. Cassie Freer on 11/29/2023. Dr. Darrold Junker to decide when to stop Amiodarone (he is down to 200 mg daily). He will continued to be followed by cardiology and will return to [facility] in one week with PA/LAT CXR. Of note, he had bilateral internal carotid artery stenoses on duples carotid US (part of pre op work up). Patient will need another carotid duplex US and CTA of the neck and will see Dr. Karin Lieu from vascular surgery 01/06/2024.    Ardelle Balls, PA-C [facility] [phone]

## 2023-12-06 ENCOUNTER — Other Ambulatory Visit: Payer: Self-pay

## 2023-12-06 DIAGNOSIS — I6523 Occlusion and stenosis of bilateral carotid arteries: Secondary | ICD-10-CM

## 2023-12-14 ENCOUNTER — Other Ambulatory Visit: Payer: Self-pay | Admitting: Thoracic Surgery (Cardiothoracic Vascular Surgery)

## 2023-12-14 DIAGNOSIS — Z951 Presence of aortocoronary bypass graft: Secondary | ICD-10-CM

## 2023-12-16 ENCOUNTER — Ambulatory Visit
Admission: RE | Admit: 2023-12-16 | Discharge: 2023-12-16 | Disposition: A | Discharge status: Home / Self Care | Source: Ambulatory Visit | Attending: Thoracic Surgery (Cardiothoracic Vascular Surgery)

## 2023-12-16 ENCOUNTER — Other Ambulatory Visit: Payer: Self-pay

## 2023-12-16 ENCOUNTER — Ambulatory Visit (INDEPENDENT_AMBULATORY_CARE_PROVIDER_SITE_OTHER): Payer: Self-pay

## 2023-12-16 VITALS — BP 132/80 | HR 72 | Resp 20 | Ht 69.0 in | Wt 159.8 lb

## 2023-12-16 DIAGNOSIS — Z951 Presence of aortocoronary bypass graft: Secondary | ICD-10-CM

## 2023-12-16 MED ORDER — FUROSEMIDE 40 MG PO TABS: 40.0000 mg | ORAL_TABLET | Freq: Every day | ORAL | 0 refills | Status: AC

## 2023-12-16 MED ORDER — POTASSIUM CHLORIDE CRYS ER 20 MEQ PO TBCR
20.0000 meq | EXTENDED_RELEASE_TABLET | Freq: Every day | ORAL | 0 refills | Status: DC
Start: 2023-12-16 — End: 2024-01-18

## 2023-12-16 NOTE — Patient Instructions (Addendum)
 Continue to avoid any heavy lifting or strenuous use of your arms or shoulders for at least a total of three months from the time of surgery.  After three months you may gradually increase how much you lift or otherwise use your arms or chest as tolerated, with limits based upon whether or not activities lead to the return of significant discomfort. 2.  You may return to driving an automobile as long as you are no longer requiring oral narcotic pain relievers during the daytime.  It would be wise to start driving only short distances during the daylight and gradually increase from there as you feel comfortable. 3.  You are encouraged to enroll and participate in the outpatient cardiac rehab program beginning as soon as practical. We will refer him to [facility] cardiac rehab. 4. Lasix and potassium daily for the next week (left pleural effusion)

## 2023-12-16 NOTE — Progress Notes (Signed)
 Referral placed to [facility] per Dr. Lucilla Lame orders.

## 2023-12-17 NOTE — Progress Notes (Signed)
 [address]       [name] [address]             [phone]  HPI: This is a 58 year old who is s/p CABG X 3 (LIMA LAD, RSVG PDA, RSVG to OM, with endoscopic greater saphenous vein harvest on the right by Dr. Cliffton Asters on 11/08/2023. I saw him in post op follow up on 12/16/2023. He was doing well at that time. On chest x ray, he was found to have a moderate left pleural effusion. Lasix 40 mg daily and potassium 20 meq daily were taken since last week. He presents today for follow up of left pleural effusion post diuresis. He denies chest pain or shortness of breath.  Current Outpatient Medications  Medication Sig Dispense Refill   amiodarone (PACERONE) 200 MG tablet Take 2 tablets (400mg ) twice per day for 7 days, then take 1 tablet (200mg ) twice per day for 7 days, then take 1 tablet (200mg ) daily thereafter 60 tablet 1   amLODipine (NORVASC) 10 MG tablet Take 10 mg by mouth daily.     aspirin 81 MG tablet Take 81 mg by mouth daily.      clopidogrel (PLAVIX) 75 MG tablet Take 1 tablet (75 mg total) by mouth daily. 90 tablet 1   Coenzyme Q10 100 MG TABS Take 100 mg by mouth daily.     furosemide (LASIX) 40 MG tablet Take 1 tablet (40 mg total) by mouth daily. 7 tablet 0   loratadine (CLARITIN) 10 MG tablet Take 1 tablet (10 mg total) by mouth daily. 90 tablet 1   metoprolol succinate (TOPROL-XL) 100 MG 24 hr tablet Take 1 tablet (100 mg total) by mouth daily. Take with or immediately following a meal. 30 tablet 1   Omega-3 Fatty Acids (FISH OIL PO) Take 690 mg by mouth daily.     potassium chloride SA (KLOR-CON M) 20 MEQ tablet Take 1 tablet (20 mEq total) by mouth daily. For 4 days then stop 7 tablet 0   pravastatin (PRAVACHOL) 20 MG tablet Take 1 tablet (20 mg total) by mouth at bedtime. 30 tablet 1  Vital Signs: Vitals:   12/22/23 1217  BP: 114/73  Pulse: (!) 55  Resp: 20  SpO2: 99%      Physical Exam: CV-RRR Pulmonary-Clear to auscultation  bilaterally Extremities-No LE Wounds-Clean, dry, well healed  Diagnostic Tests:  Narrative & Impression  CLINICAL DATA:  CABG   EXAM: CHEST - 2 VIEW   COMPARISON:  December 16, 2023   FINDINGS: No cardiomegaly. Sternotomy wires and changes of a prior CABG. Triangular opacity in the left lung base on the frontal radiograph. No lobar consolidation or pneumothorax. Multilevel thoracic osteophytosis.   IMPRESSION: Triangular opacity in the left lung base on the frontal radiograph, may reflect changes of a small left pleural effusion, partially loculated versus subsegmental or lobar atelectasis. Nonemergent chest CT with IV contrast is recommended for further characterization.   These results will be called to the ordering clinician or representative by the Radiologist Assistant and communication documented in the PACS or [employer].     Electronically Signed   By: Wallie Char M.D.   On: 12/22/2023 12:55      Impression and Plan: We discussed that today's chest x ray showed decreased left pleural effusion. He has imaging studies and follow up with Dr. Sherral Hammers 01/06/2024 for carotid artery stenosis. He will continue to be followed by cardiology (Dr. Darrold Junker) and will  see [facility] PRN    Ardelle Balls, PA-C [facility] [phone]

## 2023-12-21 ENCOUNTER — Other Ambulatory Visit: Payer: Self-pay | Admitting: Thoracic Surgery (Cardiothoracic Vascular Surgery)

## 2023-12-21 DIAGNOSIS — Z951 Presence of aortocoronary bypass graft: Secondary | ICD-10-CM

## 2023-12-22 ENCOUNTER — Ambulatory Visit (INDEPENDENT_AMBULATORY_CARE_PROVIDER_SITE_OTHER): Payer: Self-pay | Admitting: Physician Assistant

## 2023-12-22 ENCOUNTER — Ambulatory Visit
Admission: RE | Admit: 2023-12-22 | Discharge: 2023-12-22 | Disposition: A | Discharge status: Home / Self Care | Source: Ambulatory Visit | Attending: Thoracic Surgery (Cardiothoracic Vascular Surgery)

## 2023-12-22 VITALS — BP 114/73 | HR 55 | Resp 20 | Ht 69.0 in | Wt 153.0 lb

## 2023-12-22 DIAGNOSIS — Z951 Presence of aortocoronary bypass graft: Secondary | ICD-10-CM

## 2023-12-22 DIAGNOSIS — I25118 Atherosclerotic heart disease of native coronary artery with other forms of angina pectoris: Secondary | ICD-10-CM

## 2023-12-23 ENCOUNTER — Encounter: Attending: Cardiology | Admitting: *Deleted

## 2023-12-23 ENCOUNTER — Encounter: Payer: Self-pay | Admitting: *Deleted

## 2023-12-23 DIAGNOSIS — Z7902 Long term (current) use of antithrombotics/antiplatelets: Secondary | ICD-10-CM | POA: Insufficient documentation

## 2023-12-23 DIAGNOSIS — Z951 Presence of aortocoronary bypass graft: Secondary | ICD-10-CM | POA: Insufficient documentation

## 2023-12-23 DIAGNOSIS — Z87891 Personal history of nicotine dependence: Secondary | ICD-10-CM | POA: Insufficient documentation

## 2023-12-23 DIAGNOSIS — Z48812 Encounter for surgical aftercare following surgery on the circulatory system: Secondary | ICD-10-CM | POA: Insufficient documentation

## 2023-12-23 DIAGNOSIS — Z713 Dietary counseling and surveillance: Secondary | ICD-10-CM | POA: Insufficient documentation

## 2023-12-23 NOTE — Progress Notes (Signed)
 Virtual orientation call completed today. he has an appointment on Date: 12/29/2023  for EP eval and gym Orientation.  Documentation of diagnosis can be found in [facility] 11/08/2023 .

## 2023-12-28 ENCOUNTER — Ambulatory Visit
Admission: RE | Admit: 2023-12-28 | Discharge: 2023-12-28 | Disposition: A | Discharge status: Home / Self Care | Source: Ambulatory Visit | Attending: Vascular Surgery | Admitting: Vascular Surgery

## 2023-12-28 ENCOUNTER — Other Ambulatory Visit: Payer: Self-pay | Admitting: Vascular Surgery

## 2023-12-28 DIAGNOSIS — I6523 Occlusion and stenosis of bilateral carotid arteries: Secondary | ICD-10-CM

## 2023-12-28 MED ORDER — IOPAMIDOL (ISOVUE-370) INJECTION 76%
75.0000 mL | Freq: Once | INTRAVENOUS | Status: AC | PRN
  Administered 2023-12-28: 75 mL via INTRAVENOUS

## 2023-12-29 ENCOUNTER — Encounter

## 2023-12-29 VITALS — Ht 69.5 in | Wt 157.7 lb

## 2023-12-29 DIAGNOSIS — Z7902 Long term (current) use of antithrombotics/antiplatelets: Secondary | ICD-10-CM | POA: Diagnosis not present

## 2023-12-29 DIAGNOSIS — Z713 Dietary counseling and surveillance: Secondary | ICD-10-CM | POA: Diagnosis not present

## 2023-12-29 DIAGNOSIS — Z87891 Personal history of nicotine dependence: Secondary | ICD-10-CM | POA: Diagnosis not present

## 2023-12-29 DIAGNOSIS — Z951 Presence of aortocoronary bypass graft: Secondary | ICD-10-CM | POA: Diagnosis present

## 2023-12-29 DIAGNOSIS — Z48812 Encounter for surgical aftercare following surgery on the circulatory system: Secondary | ICD-10-CM | POA: Diagnosis present

## 2023-12-29 NOTE — Patient Instructions (Signed)
 Patient Instructions  Patient Details  Name: [name] MRN: [id number] Date of Birth: 10-11-1965 Referring Provider:  Marcina Millard, MD  Below are your personal goals for exercise, nutrition, and risk factors. Our goal is to help you stay on track towards obtaining and maintaining these goals. We will be discussing your progress on these goals with you throughout the program.  Initial Exercise Prescription:  Initial Exercise Prescription - 12/29/23 1100       Date of Initial Exercise RX and Referring Provider   Date 12/29/23    Referring Provider Dr. Marcina Millard, MD      Oxygen   Maintain Oxygen Saturation 88% or higher      Treadmill   MPH 2.6    Grade 1    Minutes 15    METs 3.35      NuStep   Level 3   T6 nustep   SPM 80    Minutes 15    METs 3.49      REL-XR   Level 3    Speed 50    Minutes 15    METs 3.49      Prescription Details   Frequency (times per week) 3    Duration Progress to 30 minutes of continuous aerobic without signs/symptoms of physical distress      Intensity   THRR 40-80% of Max Heartrate 95-139    Ratings of Perceived Exertion 11-13    Perceived Dyspnea 0-4      Progression   Progression Continue to progress workloads to maintain intensity without signs/symptoms of physical distress.      Resistance Training   Training Prescription Yes    Weight 4 lb    Reps 10-15             Exercise Goals: Frequency: Be able to perform aerobic exercise two to three times per week in program working toward 2-5 days per week of home exercise.  Intensity: Work with a perceived exertion of 11 (fairly light) - 15 (hard) while following your exercise prescription.  We will make changes to your prescription with you as you progress through the program.   Duration: Be able to do 30 to 45 minutes of continuous aerobic exercise in addition to a 5 minute warm-up and a 5 minute cool-down routine.   Nutrition Goals: Your personal  nutrition goals will be established when you do your nutrition analysis with the dietician.  The following are general nutrition guidelines to follow: Cholesterol < 200mg /day Sodium < 1500mg /day Fiber: Men over 50 yrs - 30 grams per day  Personal Goals:  Personal Goals and Risk Factors at Admission - 12/23/23 1447       Core Components/Risk Factors/Patient Goals on Admission    Weight Management Yes    Intervention Weight Management: Develop a combined nutrition and exercise program designed to reach desired caloric intake, while maintaining appropriate intake of nutrient and fiber, sodium and fats, and appropriate energy expenditure required for the weight goal.;Weight Management: Provide education and appropriate resources to help participant work on and attain dietary goals.    Admit Weight 154 lb (69.9 kg)    Goal Weight: Short Term 155 lb (70.3 kg)    Goal Weight: Long Term 178 lb (80.7 kg)    Expected Outcomes Short Term: Continue to assess and modify interventions until short term weight is achieved;Long Term: Adherence to nutrition and physical activity/exercise program aimed toward attainment of established weight goal;Weight Gain: Understanding of general recommendations for a  high calorie, high protein meal plan that promotes weight gain by distributing calorie intake throughout the day with the consumption for 4-5 meals, snacks, and/or supplements    Hypertension Yes    Intervention Provide education on lifestyle modifcations including regular physical activity/exercise, weight management, moderate sodium restriction and increased consumption of fresh fruit, vegetables, and low fat dairy, alcohol moderation, and smoking cessation.;Monitor prescription use compliance.    Expected Outcomes Short Term: Continued assessment and intervention until BP is < 140/25mm HG in hypertensive participants. < 130/74mm HG in hypertensive participants with diabetes, heart failure or chronic kidney  disease.;Long Term: Maintenance of blood pressure at goal levels.    Lipids Yes    Intervention Provide education and support for participant on nutrition & aerobic/resistive exercise along with prescribed medications to achieve LDL 70mg , HDL >40mg .    Expected Outcomes Short Term: Participant states understanding of desired cholesterol values and is compliant with medications prescribed. Participant is following exercise prescription and nutrition guidelines.;Long Term: Cholesterol controlled with medications as prescribed, with individualized exercise RX and with personalized nutrition plan. Value goals: LDL < 70mg , HDL > 40 mg.             Exercise Goals and Review:  Exercise Goals     Row Name 12/29/23 1122             Exercise Goals   Increase Physical Activity Yes       Intervention Develop an individualized exercise prescription for aerobic and resistive training based on initial evaluation findings, risk stratification, comorbidities and participant's personal goals.;Provide advice, education, support and counseling about physical activity/exercise needs.       Expected Outcomes Short Term: Attend rehab on a regular basis to increase amount of physical activity.;Long Term: Add in home exercise to make exercise part of routine and to increase amount of physical activity.;Long Term: Exercising regularly at least 3-5 days a week.       Increase Strength and Stamina Yes       Intervention Provide advice, education, support and counseling about physical activity/exercise needs.;Develop an individualized exercise prescription for aerobic and resistive training based on initial evaluation findings, risk stratification, comorbidities and participant's personal goals.       Expected Outcomes Short Term: Increase workloads from initial exercise prescription for resistance, speed, and METs.;Short Term: Perform resistance training exercises routinely during rehab and add in resistance training  at home;Long Term: Improve cardiorespiratory fitness, muscular endurance and strength as measured by increased METs and functional capacity ( )       Able to understand and use rate of perceived exertion (RPE) scale Yes       Intervention Provide education and explanation on how to use RPE scale       Expected Outcomes Short Term: Able to use RPE daily in rehab to express subjective intensity level;Long Term:  Able to use RPE to guide intensity level when exercising independently       Able to understand and use Dyspnea scale Yes       Intervention Provide education and explanation on how to use Dyspnea scale       Expected Outcomes Short Term: Able to use Dyspnea scale daily in rehab to express subjective sense of shortness of breath during exertion;Long Term: Able to use Dyspnea scale to guide intensity level when exercising independently       Knowledge and understanding of Target Heart Rate Range (THRR) Yes       Intervention Provide education and explanation  of THRR including how the numbers were predicted and where they are located for reference       Expected Outcomes Short Term: Able to state/look up THRR;Long Term: Able to use THRR to govern intensity when exercising independently;Short Term: Able to use daily as guideline for intensity in rehab       Able to check pulse independently Yes       Intervention Review the importance of being able to check your own pulse for safety during independent exercise;Provide education and demonstration on how to check pulse in carotid and radial arteries.       Expected Outcomes Short Term: Able to explain why pulse checking is important during independent exercise;Long Term: Able to check pulse independently and accurately       Understanding of Exercise Prescription Yes       Intervention Provide education, explanation, and written materials on patient's individual exercise prescription       Expected Outcomes Long Term: Able to explain home exercise  prescription to exercise independently;Short Term: Able to explain program exercise prescription

## 2023-12-29 NOTE — Progress Notes (Signed)
 Assessment start time: 10:12 AM  Digestive issues/concerns: no known food allergies   24-hours Recall: B: cottage cheese, pineapple L: cheese and crackers D: pork chop, potatoes, green beans   Beverages mt dew, water (32oz), root beer  Education r/t nutrition plan Patient drinking ~32oz of water and ~32oz of regular soda. Spoke with him about drinking sugary and calorie dense beverages. He feels its part of his morning routine and would struggle with energy without it. His A1C is normal. Reviewed mediterranean diet handout. Educated on types of fats, sources and how to read label. Reviewed balanced plates handout. Discussed the importance of including colorful produce at plates. Explained the benefits of pairing carbs with protein or healthy fats. Brainstormed meal and snacks ideas with focus on variety while keeping sodium and saturated fat intake below the limits provided of 1500mg  and 12g respectively.    Goal 1: Read labels and reduce sodium intake to below 2300mg . Ideally 1500mg  per day.  Goal 2: Reduce saturated fat, less than 12g per day. Replace bad fats for more heart healthy fats. Goal 3: Eat 15-30gProtein and 30-60gCarbs at each meal.  End time 10:36 AM

## 2023-12-29 NOTE — Progress Notes (Signed)
 Cardiac Individual Treatment Plan  Patient Details  Name: [name] MRN: [id number] Date of Birth: 1966-03-09 Referring Provider:   Flowsheet Row Cardiac Rehab from 12/29/2023 in [facility] Cardiac and Pulmonary Rehab  Referring Provider Dr. Marcina Millard, MD       Initial Encounter Date:  Flowsheet Row Cardiac Rehab from 12/29/2023 in [facility] Cardiac and Pulmonary Rehab  Date 12/29/23       Visit Diagnosis: S/P CABG x 3  Patient's Home Medications on Admission:  Current Outpatient Medications:    amiodarone (PACERONE) 200 MG tablet, Take 2 tablets (400mg ) twice per day for 7 days, then take 1 tablet (200mg ) twice per day for 7 days, then take 1 tablet (200mg ) daily thereafter, Disp: 60 tablet, Rfl: 1   amLODipine (NORVASC) 10 MG tablet, Take 10 mg by mouth daily., Disp: , Rfl:    aspirin 81 MG tablet, Take 81 mg by mouth daily. , Disp: , Rfl:    clopidogrel (PLAVIX) 75 MG tablet, Take 1 tablet (75 mg total) by mouth daily., Disp: 90 tablet, Rfl: 1   Coenzyme Q10 100 MG TABS, Take 100 mg by mouth daily., Disp: , Rfl:    furosemide (LASIX) 40 MG tablet, Take 1 tablet (40 mg total) by mouth daily. (Patient not taking: Reported on 12/23/2023), Disp: 7 tablet, Rfl: 0   loratadine (CLARITIN) 10 MG tablet, Take 1 tablet (10 mg total) by mouth daily., Disp: 90 tablet, Rfl: 1   metoprolol succinate (TOPROL-XL) 100 MG 24 hr tablet, Take 1 tablet (100 mg total) by mouth daily. Take with or immediately following a meal., Disp: 30 tablet, Rfl: 1   Omega-3 Fatty Acids (FISH OIL PO), Take 690 mg by mouth daily., Disp: , Rfl:    potassium chloride SA (KLOR-CON M) 20 MEQ tablet, Take 1 tablet (20 mEq total) by mouth daily. For 4 days then stop (Patient not taking: Reported on 12/23/2023), Disp: 7 tablet, Rfl: 0   pravastatin (PRAVACHOL) 20 MG tablet, Take 1 tablet (20 mg total) by mouth at bedtime., Disp: 30 tablet, Rfl: 1  Past Medical History: Past Medical History:  Diagnosis Date   Anxiety    CAD  (coronary artery disease)    HLD (hyperlipidemia)    Hypertension    PVC (premature ventricular contraction)    Thrombocytopenia (HCC)     Tobacco Use: Social History   Tobacco Use  Smoking Status Former   Current packs/day: 0.00   Average packs/day: 0.3 packs/day for 25.0 years (6.3 ttl pk-yrs)   Types: Cigarettes   Start date: 09/1992   Quit date: 09/2017   Years since quitting: 6.2  Smokeless Tobacco Never    Labs: Review Flowsheet       Latest Ref Rng & Units 11/08/2020 05/27/2022 11/04/2023 11/08/2023  Labs for ITP Cardiac and Pulmonary Rehab  Cholestrol <200 mg/dL 409  811  - -  LDL (calc) mg/dL (calc) --  914  - -  HDL-C > OR = 40 mg/dL 36  43  - -  Trlycerides <150 mg/dL 782  956  - -  Hemoglobin A1c 4.8 - 5.6 % - - 4.7  -  PH, Arterial 7.35 - 7.45 - - - 7.314  7.316  7.425  7.367  7.385  7.358  7.401   PCO2 arterial 32 - 48 mmHg - - - 42.8  42.1  27.6  44.8  39.9  38.3  36.4   Bicarbonate 20.0 - 28.0 mmol/L - - - 21.6  21.4  18.4  25.7  23.9  22.1  21.6  22.6   TCO2 22 - 32 mmol/L - - - 23  23  19  24  27  25  25  23  23  24  22  24    Acid-base deficit 0.0 - 2.0 mmol/L - - - 4.0  4.0  6.0  1.0  3.0  4.0  2.0   O2 Saturation % - - - 99  98  99  100  100  82  100  100     Details       Multiple values from one day are sorted in reverse-chronological order          Exercise Target Goals: Exercise Program Goal: Individual exercise prescription set using results from initial 6 min walk test and THRR while considering  patient's activity barriers and safety.   Exercise Prescription Goal: Initial exercise prescription builds to 30-45 minutes a day of aerobic activity, 2-3 days per week.  Home exercise guidelines will be given to patient during program as part of exercise prescription that the participant will acknowledge.   Education: Aerobic Exercise: - Group verbal and visual presentation on the components of exercise prescription. Introduces F.I.T.T principle  from ACSM for exercise prescriptions.  Reviews F.I.T.T. principles of aerobic exercise including progression. Written material given at graduation.   Education: Resistance Exercise: - Group verbal and visual presentation on the components of exercise prescription. Introduces F.I.T.T principle from ACSM for exercise prescriptions  Reviews F.I.T.T. principles of resistance exercise including progression. Written material given at graduation.    Education: Exercise & Equipment Safety: - Individual verbal instruction and demonstration of equipment use and safety with use of the equipment. Flowsheet Row Cardiac Rehab from 12/29/2023 in [facility] Cardiac and Pulmonary Rehab  Date 12/29/23  Educator NT  Instruction Review Code 1- Verbalizes Understanding       Education: Exercise Physiology & General Exercise Guidelines: - Group verbal and written instruction with models to review the exercise physiology of the cardiovascular system and associated critical values. Provides general exercise guidelines with specific guidelines to those with heart or lung disease.    Education: Flexibility, Balance, Mind/Body Relaxation: - Group verbal and visual presentation with interactive activity on the components of exercise prescription. Introduces F.I.T.T principle from ACSM for exercise prescriptions. Reviews F.I.T.T. principles of flexibility and balance exercise training including progression. Also discusses the mind body connection.  Reviews various relaxation techniques to help reduce and manage stress (i.e. Deep breathing, progressive muscle relaxation, and visualization). Balance handout provided to take home. Written material given at graduation.   Activity Barriers & Risk Stratification:  Activity Barriers & Cardiac Risk Stratification - 12/29/23 1122       Activity Barriers & Cardiac Risk Stratification   Activity Barriers Back Problems    Cardiac Risk Stratification High             6 Minute  Walk:  6 Minute Walk     Row Name 12/29/23 1120         6 Minute Walk   Phase Initial     Distance 1330 feet     Walk Time 6 minutes     # of Rest Breaks 0     MPH 2.52     METS 3.49     RPE 9     Perceived Dyspnea  1     VO2 Peak 12.22     Symptoms No     Resting HR 52 bpm  Resting BP 124/58     Resting Oxygen Saturation  99 %     Exercise Oxygen Saturation  during 6 min walk 99 %     Max Ex. HR 62 bpm     Max Ex. BP 128/60     2 Minute Post BP 126/64              Oxygen Initial Assessment:   Oxygen Re-Evaluation:   Oxygen Discharge (Final Oxygen Re-Evaluation):   Initial Exercise Prescription:  Initial Exercise Prescription - 12/29/23 1100       Date of Initial Exercise RX and Referring Provider   Date 12/29/23    Referring Provider Dr. Marcina Millard, MD      Oxygen   Maintain Oxygen Saturation 88% or higher      Treadmill   MPH 2.6    Grade 1    Minutes 15    METs 3.35      NuStep   Level 3   T6 nustep   SPM 80    Minutes 15    METs 3.49      REL-XR   Level 3    Speed 50    Minutes 15    METs 3.49      Prescription Details   Frequency (times per week) 3    Duration Progress to 30 minutes of continuous aerobic without signs/symptoms of physical distress      Intensity   THRR 40-80% of Max Heartrate 95-139    Ratings of Perceived Exertion 11-13    Perceived Dyspnea 0-4      Progression   Progression Continue to progress workloads to maintain intensity without signs/symptoms of physical distress.      Resistance Training   Training Prescription Yes    Weight 4 lb    Reps 10-15             Perform Capillary Blood Glucose checks as needed.  Exercise Prescription Changes:   Exercise Prescription Changes     Row Name 12/29/23 1100             Response to Exercise   Blood Pressure (Admit) 124/58       Blood Pressure (Exercise) 128/60       Blood Pressure (Exit) 126/64       Heart Rate (Admit) 52 bpm        Heart Rate (Exercise) 62 bpm       Heart Rate (Exit) 52 bpm       Oxygen Saturation (Admit) 99 %       Oxygen Saturation (Exercise) 99 %       Rating of Perceived Exertion (Exercise) 9       Perceived Dyspnea (Exercise) 1       Symptoms none       Comments Results                Exercise Comments:   Exercise Goals and Review:   Exercise Goals     Row Name 12/29/23 1122             Exercise Goals   Increase Physical Activity Yes       Intervention Develop an individualized exercise prescription for aerobic and resistive training based on initial evaluation findings, risk stratification, comorbidities and participant's personal goals.;Provide advice, education, support and counseling about physical activity/exercise needs.       Expected Outcomes Short Term: Attend rehab on a regular basis to increase amount of physical activity.;Long  Term: Add in home exercise to make exercise part of routine and to increase amount of physical activity.;Long Term: Exercising regularly at least 3-5 days a week.       Increase Strength and Stamina Yes       Intervention Provide advice, education, support and counseling about physical activity/exercise needs.;Develop an individualized exercise prescription for aerobic and resistive training based on initial evaluation findings, risk stratification, comorbidities and participant's personal goals.       Expected Outcomes Short Term: Increase workloads from initial exercise prescription for resistance, speed, and METs.;Short Term: Perform resistance training exercises routinely during rehab and add in resistance training at home;Long Term: Improve cardiorespiratory fitness, muscular endurance and strength as measured by increased METs and functional capacity ( )       Able to understand and use rate of perceived exertion (RPE) scale Yes       Intervention Provide education and explanation on how to use RPE scale       Expected Outcomes Short  Term: Able to use RPE daily in rehab to express subjective intensity level;Long Term:  Able to use RPE to guide intensity level when exercising independently       Able to understand and use Dyspnea scale Yes       Intervention Provide education and explanation on how to use Dyspnea scale       Expected Outcomes Short Term: Able to use Dyspnea scale daily in rehab to express subjective sense of shortness of breath during exertion;Long Term: Able to use Dyspnea scale to guide intensity level when exercising independently       Knowledge and understanding of Target Heart Rate Range (THRR) Yes       Intervention Provide education and explanation of THRR including how the numbers were predicted and where they are located for reference       Expected Outcomes Short Term: Able to state/look up THRR;Long Term: Able to use THRR to govern intensity when exercising independently;Short Term: Able to use daily as guideline for intensity in rehab       Able to check pulse independently Yes       Intervention Review the importance of being able to check your own pulse for safety during independent exercise;Provide education and demonstration on how to check pulse in carotid and radial arteries.       Expected Outcomes Short Term: Able to explain why pulse checking is important during independent exercise;Long Term: Able to check pulse independently and accurately       Understanding of Exercise Prescription Yes       Intervention Provide education, explanation, and written materials on patient's individual exercise prescription       Expected Outcomes Long Term: Able to explain home exercise prescription to exercise independently;Short Term: Able to explain program exercise prescription                Exercise Goals Re-Evaluation :   Discharge Exercise Prescription (Final Exercise Prescription Changes):  Exercise Prescription Changes - 12/29/23 1100       Response to Exercise   Blood Pressure (Admit)  124/58    Blood Pressure (Exercise) 128/60    Blood Pressure (Exit) 126/64    Heart Rate (Admit) 52 bpm    Heart Rate (Exercise) 62 bpm    Heart Rate (Exit) 52 bpm    Oxygen Saturation (Admit) 99 %    Oxygen Saturation (Exercise) 99 %    Rating of Perceived Exertion (Exercise) 9  Perceived Dyspnea (Exercise) 1    Symptoms none    Comments Results             Nutrition:  Target Goals: Understanding of nutrition guidelines, daily intake of sodium 1500mg , cholesterol 200mg , calories 30% from fat and 7% or less from saturated fats, daily to have 5 or more servings of fruits and vegetables.  Education: All About Nutrition: -Group instruction provided by verbal, written material, interactive activities, discussions, models, and posters to present general guidelines for heart healthy nutrition including fat, fiber, MyPlate, the role of sodium in heart healthy nutrition, utilization of the nutrition label, and utilization of this knowledge for meal planning. Follow up email sent as well. Written material given at graduation.   Biometrics:  Pre Biometrics - 12/29/23 1123       Pre Biometrics   Height 5' 9.5" (1.765 m)    Weight 157 lb 11.2 oz (71.5 kg)    Waist Circumference 34 inches    Hip Circumference 36 inches    Waist to Hip Ratio 0.94 %    BMI (Calculated) 22.96    Single Leg Stand 23.7 seconds              Nutrition Therapy Plan and Nutrition Goals:   Nutrition Assessments:  MEDIFICTS Score Key: >=70 Need to make dietary changes  40-70 Heart Healthy Diet <= 40 Therapeutic Level Cholesterol Diet  Flowsheet Row Cardiac Rehab from 12/29/2023 in [facility] Cardiac and Pulmonary Rehab  Picture Your Plate Total Score on Admission 63      Picture Your Plate Scores: <40 Unhealthy dietary pattern with much room for improvement. 41-50 Dietary pattern unlikely to meet recommendations for good health and room for improvement. 51-60 More healthful dietary pattern,  with some room for improvement.  >60 Healthy dietary pattern, although there may be some specific behaviors that could be improved.    Nutrition Goals Re-Evaluation:   Nutrition Goals Discharge (Final Nutrition Goals Re-Evaluation):   Psychosocial: Target Goals: Acknowledge presence or absence of significant depression and/or stress, maximize coping skills, provide positive support system. Participant is able to verbalize types and ability to use techniques and skills needed for reducing stress and depression.   Education: Stress, Anxiety, and Depression - Group verbal and visual presentation to define topics covered.  Reviews how body is impacted by stress, anxiety, and depression.  Also discusses healthy ways to reduce stress and to treat/manage anxiety and depression.  Written material given at graduation.   Education: Sleep Hygiene -Provides group verbal and written instruction about how sleep can affect your health.  Define sleep hygiene, discuss sleep cycles and impact of sleep habits. Review good sleep hygiene tips.    Initial Review & Psychosocial Screening:  Initial Psych Review & Screening - 12/23/23 1446       Initial Review   Current issues with None Identified      Family Dynamics   Good Support System? Yes   wife nad brother     Barriers   Psychosocial barriers to participate in program There are no identifiable barriers or psychosocial needs.      Screening Interventions   Interventions Encouraged to exercise;To provide support and resources with identified psychosocial needs;Provide feedback about the scores to participant    Expected Outcomes Short Term goal: Utilizing psychosocial counselor, staff and physician to assist with identification of specific Stressors or current issues interfering with healing process. Setting desired goal for each stressor or current issue identified.;Long Term Goal: Stressors or  current issues are controlled or eliminated.;Short Term  goal: Identification and review with participant of any Quality of Life or Depression concerns found by scoring the questionnaire.;Long Term goal: The participant improves quality of Life and PHQ9 Scores as seen by post scores and/or verbalization of changes             Quality of Life Scores:   Scores of 19 and below usually indicate a poorer quality of life in these areas.  A difference of  2-3 points is a clinically meaningful difference.  A difference of 2-3 points in the total score of the Quality of Life Index has been associated with significant improvement in overall quality of life, self-image, physical symptoms, and general health in studies assessing change in quality of life.  PHQ-9: Review Flowsheet       12/29/2023 05/19/2022  Depression screen PHQ 2/9  Decreased Interest 0 0  Down, Depressed, Hopeless 0 0  PHQ - 2 Score 0 0  Altered sleeping 1 -  Tired, decreased energy 0 -  Change in appetite 0 -  Feeling bad or failure about yourself  0 -  Trouble concentrating 0 -  Moving slowly or fidgety/restless 0 -  Suicidal thoughts 0 -  PHQ-9 Score 1 -  Difficult doing work/chores Not difficult at all -   Interpretation of Total Score  Total Score Depression Severity:  1-4 = Minimal depression, 5-9 = Mild depression, 10-14 = Moderate depression, 15-19 = Moderately severe depression, 20-27 = Severe depression   Psychosocial Evaluation and Intervention:  Psychosocial Evaluation - 12/23/23 1502       Psychosocial Evaluation & Interventions   Interventions Encouraged to exercise with the program and follow exercise prescription    Comments There are o barriers to attending the program.  HE wishes to continue to heal and get back to his lawn mower, 4 whellers and usual activities.   He lives with his wife and she is his support.   He is ready to start the program.    Expected Outcomes STG attend all scheduled sessions, follow exercise progression guidelines.  LTG continues  with exercise progression and nutriton plan to continue to work on heart healthy lifestyle    Continue Psychosocial Services  Follow up required by staff             Psychosocial Re-Evaluation:   Psychosocial Discharge (Final Psychosocial Re-Evaluation):   Vocational Rehabilitation: Provide vocational rehab assistance to qualifying candidates.   Vocational Rehab Evaluation & Intervention:   Education: Education Goals: Education classes will be provided on a variety of topics geared toward better understanding of heart health and risk factor modification. Participant will state understanding/return demonstration of topics presented as noted by education test scores.  Learning Barriers/Preferences:   General Cardiac Education Topics:  AED/CPR: - Group verbal and written instruction with the use of models to demonstrate the basic use of the AED with the basic ABC's of resuscitation.   Anatomy and Cardiac Procedures: - Group verbal and visual presentation and models provide information about basic cardiac anatomy and function. Reviews the testing methods done to diagnose heart disease and the outcomes of the test results. Describes the treatment choices: Medical Management, Angioplasty, or Coronary Bypass Surgery for treating various heart conditions including Myocardial Infarction, Angina, Valve Disease, and Cardiac Arrhythmias.  Written material given at graduation.   Medication Safety: - Group verbal and visual instruction to review commonly prescribed medications for heart and lung disease. Reviews the medication, class of the  drug, and side effects. Includes the steps to properly store meds and maintain the prescription regimen.  Written material given at graduation.   Intimacy: - Group verbal instruction through game format to discuss how heart and lung disease can affect sexual intimacy. Written material given at graduation..   Know Your Numbers and Heart Failure: -  Group verbal and visual instruction to discuss disease risk factors for cardiac and pulmonary disease and treatment options.  Reviews associated critical values for Overweight/Obesity, Hypertension, Cholesterol, and Diabetes.  Discusses basics of heart failure: signs/symptoms and treatments.  Introduces Heart Failure Zone chart for action plan for heart failure.  Written material given at graduation.   Infection Prevention: - Provides verbal and written material to individual with discussion of infection control including proper hand washing and proper equipment cleaning during exercise session. Flowsheet Row Cardiac Rehab from 12/29/2023 in [facility] Cardiac and Pulmonary Rehab  Date 12/29/23  Educator NT  Instruction Review Code 1- Verbalizes Understanding       Falls Prevention: - Provides verbal and written material to individual with discussion of falls prevention and safety. Flowsheet Row Cardiac Rehab from 12/29/2023 in [facility] Cardiac and Pulmonary Rehab  Date 12/23/23  Educator SB  Instruction Review Code 1- Verbalizes Understanding       Other: -Provides group and verbal instruction on various topics (see comments)   Knowledge Questionnaire Score:   Core Components/Risk Factors/Patient Goals at Admission:  Personal Goals and Risk Factors at Admission - 12/23/23 1447       Core Components/Risk Factors/Patient Goals on Admission    Weight Management Yes    Intervention Weight Management: Develop a combined nutrition and exercise program designed to reach desired caloric intake, while maintaining appropriate intake of nutrient and fiber, sodium and fats, and appropriate energy expenditure required for the weight goal.;Weight Management: Provide education and appropriate resources to help participant work on and attain dietary goals.    Admit Weight 154 lb (69.9 kg)    Goal Weight: Short Term 155 lb (70.3 kg)    Goal Weight: Long Term 178 lb (80.7 kg)    Expected Outcomes Short  Term: Continue to assess and modify interventions until short term weight is achieved;Long Term: Adherence to nutrition and physical activity/exercise program aimed toward attainment of established weight goal;Weight Gain: Understanding of general recommendations for a high calorie, high protein meal plan that promotes weight gain by distributing calorie intake throughout the day with the consumption for 4-5 meals, snacks, and/or supplements    Hypertension Yes    Intervention Provide education on lifestyle modifcations including regular physical activity/exercise, weight management, moderate sodium restriction and increased consumption of fresh fruit, vegetables, and low fat dairy, alcohol moderation, and smoking cessation.;Monitor prescription use compliance.    Expected Outcomes Short Term: Continued assessment and intervention until BP is < 140/40mm HG in hypertensive participants. < 130/59mm HG in hypertensive participants with diabetes, heart failure or chronic kidney disease.;Long Term: Maintenance of blood pressure at goal levels.    Lipids Yes    Intervention Provide education and support for participant on nutrition & aerobic/resistive exercise along with prescribed medications to achieve LDL 70mg , HDL >40mg .    Expected Outcomes Short Term: Participant states understanding of desired cholesterol values and is compliant with medications prescribed. Participant is following exercise prescription and nutrition guidelines.;Long Term: Cholesterol controlled with medications as prescribed, with individualized exercise RX and with personalized nutrition plan. Value goals: LDL < 70mg , HDL > 40 mg.  Education:Diabetes - Individual verbal and written instruction to review signs/symptoms of diabetes, desired ranges of glucose level fasting, after meals and with exercise. Acknowledge that pre and post exercise glucose checks will be done for 3 sessions at entry of program.   Core  Components/Risk Factors/Patient Goals Review:    Core Components/Risk Factors/Patient Goals at Discharge (Final Review):    ITP Comments:  ITP Comments     Row Name 12/23/23 1501 12/29/23 1130         ITP Comments Virtual orientation call completed today. he has an appointment on Date: 12/29/2023  for EP eval and gym Orientation.  Documentation of diagnosis can be found in [facility] 11/08/2023 . Completed and gym orientation for cardiac rehab. Initial ITP created and sent for review to Dr. Bethann Punches, Medical Director.               Comments: Initial ITP

## 2023-12-30 ENCOUNTER — Other Ambulatory Visit: Payer: Self-pay

## 2023-12-30 DIAGNOSIS — I6523 Occlusion and stenosis of bilateral carotid arteries: Secondary | ICD-10-CM

## 2024-01-03 ENCOUNTER — Encounter: Admitting: *Deleted

## 2024-01-03 DIAGNOSIS — Z48812 Encounter for surgical aftercare following surgery on the circulatory system: Secondary | ICD-10-CM | POA: Diagnosis not present

## 2024-01-03 DIAGNOSIS — Z951 Presence of aortocoronary bypass graft: Secondary | ICD-10-CM

## 2024-01-03 NOTE — Progress Notes (Signed)
 Daily Session Note  Patient Details  Name: [name] MRN: [id number] Date of Birth: [birth year] Referring Provider:   Flowsheet Row Cardiac Rehab from 12/29/2023 in [facility] Pul[facility] Rehab  Referring Provider Dr. Percival Brace, MD       Encounter Date: 01/03/2024  Check In:  Session Check In - 01/03/24 0930       Check-In   Supervising physician immediately available to respond to emergencies See telemetry face sheet for immediately available ER MD    Location [facility]-Cardiac & Pulmonary Rehab    Staff Present Maud Sorenson, RN, BSN, CCRP;Margaret Best, MS, Exercise Physiologist;Maxon Conetta BS, Exercise Physiologist;Kelly Dawne Euler, ACSM CEP, Exercise Physiologist    Virtual Visit No    Medication changes reported     No    Fall or balance concerns reported    No    Warm-up and Cool-down Performed on first and last piece of equipment    Resistance Training Performed Yes    VAD Patient? No    PAD/SET Patient? No      Pain Assessment   Currently in Pain? No/denies                Social History   Tobacco Use  Smoking Status Former   Current packs/day: 0.00   Average packs/day: 0.3 packs/day for 25.0 years (6.3 ttl pk-yrs)   Types: Cigarettes   Start date: 09/1992   Quit date: 09/2017   Years since quitting: 6.2  Smokeless Tobacco Never    Goals Met:  Independence with exercise equipment Exercise tolerated well No report of concerns or symptoms today  Goals Unmet:  Not Applicable  Comments: Pt able to follow exercise prescription today without complaint.  Will continue to monitor for progression.  First full day of exercise!  Patient was oriented to gym and equipment including functions, settings, policies, and procedures.  Patient's individual exercise prescription and treatment plan were reviewed.  All starting workloads were established based on the results of the 6 minute walk test done at initial orientation visit.  The plan for exercise  progression was also introduced and progression will be customized based on patient's performance and goals.   Dr. Firman Hughes is Medical Director for [facility] Cardiac Rehabilitation.  Dr. Fuad Aleskerov is Medical Director for [facility] Pulmonary Rehabilitation.

## 2024-01-04 ENCOUNTER — Encounter: Admitting: *Deleted

## 2024-01-04 DIAGNOSIS — Z48812 Encounter for surgical aftercare following surgery on the circulatory system: Secondary | ICD-10-CM | POA: Diagnosis not present

## 2024-01-04 DIAGNOSIS — Z951 Presence of aortocoronary bypass graft: Secondary | ICD-10-CM

## 2024-01-04 NOTE — Progress Notes (Signed)
 Daily Session Note  Patient Details  Name: [name] MRN: [id number] Date of Birth: May 13, 1966 Referring Provider:   Flowsheet Row Cardiac Rehab from 12/29/2023 in [facility] Cardiac and Pulmonary Rehab  Referring Provider Dr. Percival Brace, MD       Encounter Date: 01/04/2024  Check In:  Session Check In - 01/04/24 0943       Check-In   Supervising physician immediately available to respond to emergencies See telemetry face sheet for immediately available ER MD    Location [facility]Cardiac & Pulmonary Rehab    Staff Present Maud Sorenson, RN, BSN, CCRP;Margaret Best, MS, Exercise Physiologist;Maxon Conetta BS, Exercise Physiologist;Noah Tickle, BS, Exercise Physiologist    Virtual Visit No    Medication changes reported     No    Fall or balance concerns reported    No    Warm-up and Cool-down Performed on first and last piece of equipment    Resistance Training Performed Yes    VAD Patient? No    PAD/SET Patient? No      Pain Assessment   Currently in Pain? No/denies                Social History   Tobacco Use  Smoking Status Former   Current packs/day: 0.00   Average packs/day: 0.3 packs/day for 25.0 years (6.3 ttl pk-yrs)   Types: Cigarettes   Start date: 09/1992   Quit date: 09/2017   Years since quitting: 6.2  Smokeless Tobacco Never    Goals Met:  Independence with exercise equipment Exercise tolerated well No report of concerns or symptoms today  Goals Unmet:  Not Applicable  Comments: Pt able to follow exercise prescription today without complaint.  Will continue to monitor for progression.    Dr. Firman Hughes is Medical Director for [facility] Cardiac Rehabilitation.  Dr. Fuad Aleskerov is Medical Director for [facility] Pulmonary Rehabilitation.

## 2024-01-05 NOTE — Progress Notes (Unsigned)
 Office Note     CC:  Critical right-sided ICA stenosis  Requesting Provider:  Wilford Corner,*  HPI: [name] is a 58 y.o. (11/23/1965) male presenting at the request of .Whitaker, [employer], PA-C for critical right-sided ICA stenosis.  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  He has done well, and is currently in cardiopulmonary rehab.  The pt is *** on a statin for cholesterol management.  The pt is *** on a daily aspirin.   Other AC:  *** The pt is *** on medication for hypertension.   The pt is *** diabetic.  Tobacco hx:  ***  Past Medical History:  Diagnosis Date   Anxiety    CAD (coronary artery disease)    HLD (hyperlipidemia)    Hypertension    PVC (premature ventricular contraction)    Thrombocytopenia (HCC)     Past Surgical History:  Procedure Laterality Date   COLONOSCOPY WITH PROPOFOL N/A 12/12/2015   Procedure: COLONOSCOPY WITH PROPOFOL;  Surgeon: Scot Jun, MD;  Location: [facility] ENDOSCOPY;  Service: Endoscopy;  Laterality: N/A;   COLONOSCOPY WITH PROPOFOL N/A 06/23/2022   Procedure: COLONOSCOPY WITH PROPOFOL;  Surgeon: Regis Bill, MD;  Location: [facility] ENDOSCOPY;  Service: Endoscopy;  Laterality: N/A;   CORONARY ARTERY BYPASS GRAFT N/A 11/08/2023   Procedure: CORONARY ARTERY BYPASS GRAFTING (CABG) TIMES THREE USING LEFT INTERNAL MAMMARY ARTERY AND ENDOSCOPICALLY HARVESTED RIGHT GREATER SAPHENOUS VEIN;  Surgeon: Corliss Skains, MD;  Location: [facility] OR;  Service: Open Heart Surgery;  Laterality: N/A;   CORONARY STENT INTERVENTION N/A 02/24/2017   Procedure: Coronary Stent Intervention;  Surgeon: Marcina Millard, MD;  Location: [facility] INVASIVE CV LAB;  Service: Cardiovascular;  Laterality: N/A;   CORONARY STENT INTERVENTION N/A 11/11/2018   Procedure: CORONARY STENT INTERVENTION;  Surgeon: Alwyn Pea, MD;  Location: [facility] INVASIVE CV LAB;  Service: Cardiovascular;  Laterality: N/A;    ESOPHAGOGASTRODUODENOSCOPY (EGD) WITH PROPOFOL N/A 06/23/2022   Procedure: ESOPHAGOGASTRODUODENOSCOPY (EGD) WITH PROPOFOL;  Surgeon: Regis Bill, MD;  Location: [facility] ENDOSCOPY;  Service: Endoscopy;  Laterality: N/A;   LEFT HEART CATH AND CORONARY ANGIOGRAPHY Left 02/24/2017   Procedure: Left Heart Cath and Coronary Angiography;  Surgeon: Marcina Millard, MD;  Location: [facility] INVASIVE CV LAB;  Service: Cardiovascular;  Laterality: Left;   LEFT HEART CATH AND CORONARY ANGIOGRAPHY N/A 11/11/2018   Procedure: LEFT HEART CATH AND CORONARY ANGIOGRAPHY with possible pci and stent;  Surgeon: Alwyn Pea, MD;  Location: [facility] INVASIVE CV LAB;  Service: Cardiovascular;  Laterality: N/A;   LEFT HEART CATH AND CORONARY ANGIOGRAPHY Left 10/27/2023   Procedure: LEFT HEART CATH AND CORONARY ANGIOGRAPHY;  Surgeon: Marcina Millard, MD;  Location: [facility] INVASIVE CV LAB;  Service: Cardiovascular;  Laterality: Left;   stents     TEE WITHOUT CARDIOVERSION N/A 11/08/2023   Procedure: TRANSESOPHAGEAL ECHOCARDIOGRAM (TEE);  Surgeon: Corliss Skains, MD;  Location: [facility] OR;  Service: Open Heart Surgery;  Laterality: N/A;    Social History   Socioeconomic History   Marital status: Married    Spouse name: [name]   Number of children: 0   Years of education: Not on file   Highest education level: Not on file  Occupational History   Not on file  Tobacco Use   Smoking status: Former    Current packs/day: 0.00    Average packs/day: 0.3 packs/day for 25.0 years (6.3 ttl pk-yrs)    Types: Cigarettes    Start date: 09/1992  Quit date: 09/2017    Years since quitting: 6.2   Smokeless tobacco: Never  Vaping Use   Vaping status: Never Used  Substance and Sexual Activity   Alcohol use: Yes    Alcohol/week: 8.0 standard drinks of alcohol    Types: 8 Cans of beer per week   Drug use: No   Sexual activity: Not Currently  Other Topics Concern   Not on file  Social History Narrative   Lives at  home with wife. 2 dogs & 1 cats & 40 chickens    Social Drivers of Corporate investment banker Strain: Low Risk  (01/07/2023)   Received from [facility], [facility]   Overall Financial Resource Strain (CARDIA)    Difficulty of Paying Living Expenses: Not hard at all  Food Insecurity: Patient Unable To Answer (11/08/2023)   Hunger Vital Sign    Worried About Running Out of Food in the Last Year: Patient unable to answer    Ran Out of Food in the Last Year: Patient unable to answer  Transportation Needs: Patient Unable To Answer (11/08/2023)   PRAPARE - Transportation    Lack of Transportation (Medical): Patient unable to answer    Lack of Transportation (Non-Medical): Patient unable to answer  Physical Activity: Insufficiently Active (09/16/2017)   Received from [facility], [facility]   Exercise Vital Sign    Days of Exercise per Week: 4 days    Minutes of Exercise per Session: 30 min  Stress: Stress Concern Present (09/16/2017)   Received from [facility], [facility]   [employer] of Occupational Health - Occupational Stress Questionnaire    Feeling of Stress : Rather much  Social Connections: Somewhat Isolated (09/16/2017)   Received from [facility], [facility]   Social Connection and Isolation Panel [NHANES]    Frequency of Communication with Friends and Family: More than three times a week    Frequency of Social Gatherings with Friends and Family: More than three times a week    Attends Religious Services: Never    Database administrator or Organizations: Not on file    Attends Banker Meetings: Never    Marital Status: Married  Catering manager Violence: Patient Unable To Answer (11/08/2023)   Humiliation, Afraid, Rape, and Kick questionnaire    Fear of Current or Ex-Partner: Patient unable to answer     Emotionally Abused: Patient unable to answer    Physically Abused: Patient unable to answer    Sexually Abused: Patient unable to answer   *** Family History  Problem Relation Age of Onset   CAD Mother    Hypertension Mother    Cancer Father    CAD Father     Current Outpatient Medications  Medication Sig Dispense Refill   amiodarone (PACERONE) 200 MG tablet Take 2 tablets (400mg ) twice per day for 7 days, then take 1 tablet (200mg ) twice per day for 7 days, then take 1 tablet (200mg ) daily thereafter 60 tablet 1   amLODipine (NORVASC) 10 MG tablet Take 10 mg by mouth daily.     aspirin 81 MG tablet Take 81 mg by mouth daily.      clopidogrel (PLAVIX) 75 MG tablet Take 1 tablet (75 mg total) by mouth daily. 90 tablet 1   Coenzyme Q10 100 MG TABS Take 100 mg by mouth daily.     furosemide (LASIX) 40  MG tablet Take 1 tablet (40 mg total) by mouth daily. (Patient not taking: Reported on 12/23/2023) 7 tablet 0   loratadine (CLARITIN) 10 MG tablet Take 1 tablet (10 mg total) by mouth daily. 90 tablet 1   metoprolol succinate (TOPROL-XL) 100 MG 24 hr tablet Take 1 tablet (100 mg total) by mouth daily. Take with or immediately following a meal. 30 tablet 1   Omega-3 Fatty Acids (FISH OIL PO) Take 690 mg by mouth daily.     potassium chloride SA (KLOR-CON M) 20 MEQ tablet Take 1 tablet (20 mEq total) by mouth daily. For 4 days then stop (Patient not taking: Reported on 12/23/2023) 7 tablet 0   pravastatin (PRAVACHOL) 20 MG tablet Take 1 tablet (20 mg total) by mouth at bedtime. 30 tablet 1   No current facility-administered medications for this visit.    Allergies  Allergen Reactions   Lopid [Gemfibrozil] Other (See Comments)    Legs hurt   Statins Other (See Comments)    Pain and cramps      REVIEW OF SYSTEMS:  *** [X]  denotes positive finding, [ ]  denotes negative finding Cardiac  Comments:  Chest pain or chest pressure:    Shortness of breath upon exertion:    Short of breath  when lying flat:    Irregular heart rhythm:        Vascular    Pain in calf, thigh, or hip brought on by ambulation:    Pain in feet at night that wakes you up from your sleep:     Blood clot in your veins:    Leg swelling:         Pulmonary    Oxygen at home:    Productive cough:     Wheezing:         Neurologic    Sudden weakness in arms or legs:     Sudden numbness in arms or legs:     Sudden onset of difficulty speaking or slurred speech:    Temporary loss of vision in one eye:     Problems with dizziness:         Gastrointestinal    Blood in stool:     Vomited blood:         Genitourinary    Burning when urinating:     Blood in urine:        Psychiatric    Major depression:         Hematologic    Bleeding problems:    Problems with blood clotting too easily:        Skin    Rashes or ulcers:        Constitutional    Fever or chills:      PHYSICAL EXAMINATION:  There were no vitals filed for this visit.  General:  WDWN in NAD; vital signs documented above Gait: Not observed HENT: WNL, normocephalic Pulmonary: normal non-labored breathing , without wheezing Cardiac: {Desc; regular/irreg:14544} HR Abdomen: soft, NT, no masses Skin: {With/Without:20273} rashes Vascular Exam/Pulses:  Right Left  Radial {Exam; arterial pulse strength 0-4:30167} {Exam; arterial pulse strength 0-4:30167}  Ulnar {Exam; arterial pulse strength 0-4:30167} {Exam; arterial pulse strength 0-4:30167}  Femoral {Exam; arterial pulse strength 0-4:30167} {Exam; arterial pulse strength 0-4:30167}  Popliteal {Exam; arterial pulse strength 0-4:30167} {Exam; arterial pulse strength 0-4:30167}  DP {Exam; arterial pulse strength 0-4:30167} {Exam; arterial pulse strength 0-4:30167}  PT {Exam; arterial pulse strength 0-4:30167} {Exam; arterial pulse strength 0-4:30167}   Extremities: {With/Without:20273}  ischemic changes, {With/Without:20273} Gangrene , {With/Without:20273} cellulitis;  {With/Without:20273} open wounds;  Musculoskeletal: no muscle wasting or atrophy  Neurologic: A&O X 3;  No focal weakness or paresthesias are detected Psychiatric:  The pt has {Desc; normal/abnormal:11317::"Normal"} affect.   Non-Invasive Vascular Imaging:   ***    ASSESSMENT/PLAN: JAICEON COLLISTER is a 58 y.o. male presenting with asymptomatic, bilateral carotid artery stenosis.  The right side is more stenotic than the left.  Imaging and velocities demonstrate stenosis greater than 80%.  I had a long conversation with [name] regarding the above, most notably the natural history of carotid disease.  I quoted him an 11% risk of stroke over the next 5 years with optimal medical management.  This is decreased to 5% with intervention.  Being that he is only 54, I think he would benefit from cerebrovascular revascularization in an effort to decrease the stroke rate.  I have examined his imaging that was completed on 12/28/2023.  He is not a candidate for TCAR as his bifurcation is low.  We discussed the risks and benefits of carotid endarterectomy at length.  After discussing these risks and benefits, he elected to proceed.  Due to his cardiac history, I have asked for cardiac clearance prior to scheduling. The case will be performed with aspirin, Plavix, statin on board.     ***   Kayla Part, MD [facility] [phone]

## 2024-01-06 ENCOUNTER — Encounter

## 2024-01-06 ENCOUNTER — Ambulatory Visit (HOSPITAL_COMMUNITY)
Admission: RE | Admit: 2024-01-06 | Discharge: 2024-01-06 | Disposition: A | Discharge status: Home / Self Care | Payer: 59 | Source: Ambulatory Visit | Attending: Vascular Surgery | Admitting: Vascular Surgery

## 2024-01-06 ENCOUNTER — Encounter: Payer: Self-pay | Admitting: Vascular Surgery

## 2024-01-06 ENCOUNTER — Ambulatory Visit: Payer: 59 | Admitting: Vascular Surgery

## 2024-01-06 VITALS — BP 128/77 | HR 55 | Temp 98.1°F | Resp 20 | Ht 69.5 in | Wt 158.6 lb

## 2024-01-06 DIAGNOSIS — I6523 Occlusion and stenosis of bilateral carotid arteries: Secondary | ICD-10-CM

## 2024-01-10 ENCOUNTER — Encounter

## 2024-01-10 ENCOUNTER — Other Ambulatory Visit: Payer: Self-pay | Admitting: Physician Assistant

## 2024-01-11 ENCOUNTER — Encounter: Admitting: *Deleted

## 2024-01-11 DIAGNOSIS — Z951 Presence of aortocoronary bypass graft: Secondary | ICD-10-CM

## 2024-01-11 DIAGNOSIS — Z48812 Encounter for surgical aftercare following surgery on the circulatory system: Secondary | ICD-10-CM | POA: Diagnosis not present

## 2024-01-11 NOTE — Progress Notes (Signed)
 Daily Session Note  Patient Details  Name: [name] MRN: [id number] Date of Birth: 04/18/1966 Referring Provider:   Flowsheet Row Cardiac Rehab from 12/29/2023 in [facility] Cardiac and Pulmonary Rehab  Referring Provider Dr. Percival Brace, MD       Encounter Date: 01/11/2024  Check In:  Session Check In - 01/11/24 0927       Check-In   Supervising physician immediately available to respond to emergencies See telemetry face sheet for immediately available ER MD    Location [facility]-Cardiac & Pulmonary Rehab    Staff Present Maud Sorenson, RN, BSN, CCRP;Margaret Best, MS, Exercise Physiologist;Maxon Conetta BS, Exercise Physiologist;Noah Tickle, BS, Exercise Physiologist    Virtual Visit No    Medication changes reported     No    Fall or balance concerns reported    No    Warm-up and Cool-down Performed on first and last piece of equipment    Resistance Training Performed Yes    VAD Patient? No    PAD/SET Patient? No      Pain Assessment   Currently in Pain? No/denies                Social History   Tobacco Use  Smoking Status Former   Current packs/day: 0.00   Average packs/day: 0.3 packs/day for 25.0 years (6.3 ttl pk-yrs)   Types: Cigarettes   Start date: 09/1992   Quit date: 09/2017   Years since quitting: 6.3  Smokeless Tobacco Never    Goals Met:  Independence with exercise equipment Exercise tolerated well No report of concerns or symptoms today  Goals Unmet:  Not Applicable  Comments: Pt able to follow exercise prescription today without complaint.  Will continue to monitor for progression.    Dr. Firman Hughes is Medical Director for [facility] Cardiac Rehabilitation.  Dr. Fuad Aleskerov is Medical Director for [facility] Pulmonary Rehabilitation.

## 2024-01-13 ENCOUNTER — Encounter: Admitting: *Deleted

## 2024-01-13 DIAGNOSIS — Z951 Presence of aortocoronary bypass graft: Secondary | ICD-10-CM

## 2024-01-13 DIAGNOSIS — Z48812 Encounter for surgical aftercare following surgery on the circulatory system: Secondary | ICD-10-CM | POA: Diagnosis not present

## 2024-01-13 NOTE — Progress Notes (Signed)
 Daily Session Note  Patient Details  Name: [name] MRN: [id number] Date of Birth: 1965-11-08 Referring Provider:   Flowsheet Row Cardiac Rehab from 12/29/2023 in [facility] Cardiac and Pulmonary Rehab  Referring Provider Dr. Percival Brace, MD       Encounter Date: 01/13/2024  Check In:  Session Check In - 01/13/24 0930       Check-In   Supervising physician immediately available to respond to emergencies See telemetry face sheet for immediately available ER MD    Location [facility]-Cardiac & Pulmonary Rehab    Staff Present Sherle Dire, BS, Exercise Physiologist;Maxon Conetta BS, Exercise Physiologist;Reyli Schroth, RN, BSN, CCRP;Joseph Hood RCP,RRT,BSRT    Virtual Visit No    Medication changes reported     No    Fall or balance concerns reported    No    Warm-up and Cool-down Performed on first and last piece of equipment    Resistance Training Performed Yes    VAD Patient? No    PAD/SET Patient? No      Pain Assessment   Currently in Pain? No/denies                Social History   Tobacco Use  Smoking Status Former   Current packs/day: 0.00   Average packs/day: 0.3 packs/day for 25.0 years (6.3 ttl pk-yrs)   Types: Cigarettes   Start date: 09/1992   Quit date: 09/2017   Years since quitting: 6.3  Smokeless Tobacco Never    Goals Met:  Independence with exercise equipment Exercise tolerated well No report of concerns or symptoms today  Goals Unmet:  Not Applicable  Comments: Pt able to follow exercise prescription today without complaint.  Will continue to monitor for progression.    Dr. Firman Hughes is Medical Director for [facility] Cardiac Rehabilitation.  Dr. Fuad Aleskerov is Medical Director for [facility] Pulmonary Rehabilitation.

## 2024-01-17 ENCOUNTER — Telehealth (HOSPITAL_BASED_OUTPATIENT_CLINIC_OR_DEPARTMENT_OTHER): Payer: Self-pay | Admitting: *Deleted

## 2024-01-17 ENCOUNTER — Encounter: Admitting: *Deleted

## 2024-01-17 ENCOUNTER — Telehealth: Payer: Self-pay

## 2024-01-17 DIAGNOSIS — Z951 Presence of aortocoronary bypass graft: Secondary | ICD-10-CM

## 2024-01-17 DIAGNOSIS — Z48812 Encounter for surgical aftercare following surgery on the circulatory system: Secondary | ICD-10-CM | POA: Diagnosis not present

## 2024-01-17 NOTE — Telephone Encounter (Signed)
 Faxed cardiac clearance request to [facility] and [place].

## 2024-01-17 NOTE — Telephone Encounter (Signed)
   Pre-operative Risk Assessment    Patient Name: [name]  DOB: [birth year] MRN: [id number]   Date of last office visit: None Date of next office visit: None  Request for Surgical Clearance    Procedure:   RIGHT CEA  Date of Surgery:  Clearance TBD                                 Surgeon:  Not Indicated Surgeon's Group or Practice Name:  [facility] Phone number:  [phone] Fax number:  [phone]   Type of Clearance Requested:   - Medical  - Pharmacy:  Hold Aspirin  and Clopidogrel  (Plavix ) Not Indicated   Type of Anesthesia:  Not Indicated   Additional requests/questions:    Signed, Lauris Port   01/17/2024, 12:22 PM

## 2024-01-17 NOTE — Telephone Encounter (Signed)
    Primary Cardiologist:None  Chart reviewed as part of pre-operative protocol coverage. Because of [name]'s past medical history and time since last visit, he/she will require a follow-up visit in order to better assess preoperative cardiovascular risk.  Pre-op covering staff: - Please schedule in office appointment and call patient to inform them. - Please contact requesting surgeon's office via preferred method (i.e, phone, fax) to inform them of need for appointment prior to surgery.  If applicable, this message will also be routed to pharmacy pool and/or primary cardiologist for input on holding anticoagulant/antiplatelet agent as requested below so that this information is available at time of patient's appointment.   Carie Charity, NP  01/17/2024, 12:38 PM

## 2024-01-17 NOTE — Progress Notes (Signed)
 Daily Session Note  Patient Details  Name: [name] MRN: [id number] Date of Birth: October 19, 1965 Referring Provider:   Flowsheet Row Cardiac Rehab from 12/29/2023 in [facility] Cardiac and Pulmonary Rehab  Referring Provider Dr. Percival Brace, MD       Encounter Date: 01/17/2024  Check In:  Session Check In - 01/17/24 0924       Check-In   Supervising physician immediately available to respond to emergencies See telemetry face sheet for immediately available ER MD    Location [facility]-Cardiac & Pulmonary Rehab    Staff Present Maud Sorenson, RN, BSN, CCRP;Joseph Hood RCP,RRT,BSRT;Maxon Rockford BS, Exercise Physiologist;Kelly Epes BS, ACSM CEP, Exercise Physiologist;Jason Martina Sledge RDN,LDN    Virtual Visit No    Medication changes reported     No    Fall or balance concerns reported    No    Warm-up and Cool-down Performed on first and last piece of equipment    Resistance Training Performed Yes    VAD Patient? No    PAD/SET Patient? No      Pain Assessment   Currently in Pain? No/denies                Social History   Tobacco Use  Smoking Status Former   Current packs/day: 0.00   Average packs/day: 0.3 packs/day for 25.0 years (6.3 ttl pk-yrs)   Types: Cigarettes   Start date: 09/1992   Quit date: 09/2017   Years since quitting: 6.3  Smokeless Tobacco Never    Goals Met:  Independence with exercise equipment Exercise tolerated well No report of concerns or symptoms today  Goals Unmet:  Not Applicable  Comments: Pt able to follow exercise prescription today without complaint.  Will continue to monitor for progression.    Dr. Firman Hughes is Medical Director for [facility] Cardiac Rehabilitation.  Dr. Fuad Aleskerov is Medical Director for [facility] Pulmonary Rehabilitation.

## 2024-01-17 NOTE — Telephone Encounter (Signed)
 Spoke with patient who informed me that he already has a primary cardiologist at [facility] clinic, Dr. Parks Bollman and he only gets sent here to get carotid testing done here. Patient states that he doesn't need a appointment at our office to see a cardiologist.

## 2024-01-18 ENCOUNTER — Other Ambulatory Visit: Payer: Self-pay

## 2024-01-18 ENCOUNTER — Telehealth: Payer: Self-pay

## 2024-01-18 ENCOUNTER — Encounter: Admitting: *Deleted

## 2024-01-18 DIAGNOSIS — Z951 Presence of aortocoronary bypass graft: Secondary | ICD-10-CM

## 2024-01-18 DIAGNOSIS — Z48812 Encounter for surgical aftercare following surgery on the circulatory system: Secondary | ICD-10-CM | POA: Diagnosis not present

## 2024-01-18 DIAGNOSIS — I6523 Occlusion and stenosis of bilateral carotid arteries: Secondary | ICD-10-CM

## 2024-01-18 NOTE — Telephone Encounter (Signed)
 Attempted to call for surgery scheduling. LVM

## 2024-01-18 NOTE — Progress Notes (Signed)
 Daily Session Note  Patient Details  Name: [name] MRN: [id number] Date of Birth: Apr 15, 1966 Referring Provider:   Flowsheet Row Cardiac Rehab from 12/29/2023 in [facility] Cardiac and Pulmonary Rehab  Referring Provider Dr. Percival Brace, MD       Encounter Date: 01/18/2024  Check In:  Session Check In - 01/18/24 0936       Check-In   Supervising physician immediately available to respond to emergencies See telemetry face sheet for immediately available ER MD    Location [facility]-Cardiac & Pulmonary Rehab    Staff Present Lyell Samuel, MS, Exercise Physiologist;Maxon Conetta BS, Exercise Physiologist;Noah Tickle, BS, Exercise Physiologist;Doniqua Saxby, RN, BSN, CCRP    Virtual Visit No    Medication changes reported     No    Fall or balance concerns reported    No    Warm-up and Cool-down Performed on first and last piece of equipment    Resistance Training Performed Yes    VAD Patient? No    PAD/SET Patient? No      Pain Assessment   Currently in Pain? No/denies                Social History   Tobacco Use  Smoking Status Former   Current packs/day: 0.00   Average packs/day: 0.3 packs/day for 25.0 years (6.3 ttl pk-yrs)   Types: Cigarettes   Start date: 09/1992   Quit date: 09/2017   Years since quitting: 6.3  Smokeless Tobacco Never    Goals Met:  Independence with exercise equipment Exercise tolerated well No report of concerns or symptoms today  Goals Unmet:  Not Applicable  Comments: Pt able to follow exercise prescription today without complaint.  Will continue to monitor for progression.    Dr. Firman Hughes is Medical Director for [facility] Cardiac Rehabilitation.  Dr. Fuad Aleskerov is Medical Director for [facility] Pulmonary Rehabilitation.

## 2024-01-20 ENCOUNTER — Encounter: Attending: Cardiology | Admitting: *Deleted

## 2024-01-20 DIAGNOSIS — Z951 Presence of aortocoronary bypass graft: Secondary | ICD-10-CM | POA: Diagnosis present

## 2024-01-20 NOTE — Progress Notes (Signed)
 Daily Session Note  Patient Details  Name: [name] MRN: [id number] Date of Birth: 07/01/1966 Referring Provider:   Flowsheet Row Cardiac Rehab from 12/29/2023 in [facility] Cardiac and Pulmonary Rehab  Referring Provider Dr. Percival Brace, MD       Encounter Date: 01/20/2024  Check In:  Session Check In - 01/20/24 0936       Check-In   Supervising physician immediately available to respond to emergencies See telemetry face sheet for immediately available ER MD    Location [facility]-Cardiac & Pulmonary Rehab    Staff Present Maxon Conetta BS, Exercise Physiologist;Margaret Best, MS, Exercise Physiologist;Angeles Zehner, RN, BSN, CCRP;Joseph Hood RCP,RRT,BSRT    Virtual Visit No    Medication changes reported     No    Fall or balance concerns reported    No    Warm-up and Cool-down Performed on first and last piece of equipment    Resistance Training Performed Yes    VAD Patient? No    PAD/SET Patient? No      Pain Assessment   Currently in Pain? No/denies                Social History   Tobacco Use  Smoking Status Former   Current packs/day: 0.00   Average packs/day: 0.3 packs/day for 25.0 years (6.3 ttl pk-yrs)   Types: Cigarettes   Start date: 09/1992   Quit date: 09/2017   Years since quitting: 6.3  Smokeless Tobacco Never    Goals Met:  Independence with exercise equipment Exercise tolerated well No report of concerns or symptoms today  Goals Unmet:  Not Applicable  Comments: Pt able to follow exercise prescription today without complaint.  Will continue to monitor for progression.    Dr. Firman Hughes is Medical Director for [facility] Cardiac Rehabilitation.  Dr. Fuad Aleskerov is Medical Director for [facility] Pulmonary Rehabilitation.

## 2024-01-20 NOTE — Progress Notes (Signed)
 Surgical Instructions   Your procedure is scheduled on Jan 25, 2024. Report to [facility] Main Entrance "A" at 5:30 A.M., then check in with the Admitting office. Any questions or running late day of surgery: call [phone]  Questions prior to your surgery date: call [phone], Monday-Friday, 8am-4pm. If you experience any cold or flu symptoms such as cough, fever, chills, shortness of breath, etc. between now and your scheduled surgery, please notify us  at the above number.     Remember:  Do not eat or drink after midnight the night before your surgery      Take these medicines the morning of surgery with A SIP OF WATER   amLODipine  (NORVASC )  aspirin   clopidogrel  (PLAVIX )  loratadine  (CLARITIN )  metoprolol  succinate (TOPROL -XL)   May take these medicines IF NEEDED:    One week prior to surgery, STOP taking any Aspirin  (unless otherwise instructed by your surgeon) Aleve, Naproxen, Ibuprofen, Motrin, Advil, Goody's, BC's, all herbal medications, fish oil, and non-prescription vitamins.                     Do NOT Smoke (Tobacco/Vaping) for 24 hours prior to your procedure.  If you use a CPAP at night, you may bring your mask/headgear for your overnight stay.   You will be asked to remove any contacts, glasses, piercing's, hearing aid's, dentures/partials prior to surgery. Please bring cases for these items if needed.    Patients discharged the day of surgery will not be allowed to drive home, and someone needs to stay with them for 24 hours.  SURGICAL WAITING ROOM VISITATION Patients may have no more than 2 support people in the waiting area - these visitors may rotate.   Pre-op nurse will coordinate an appropriate time for 1 ADULT support person, who may not rotate, to accompany patient in pre-op.  Children under the age of 20 must have an adult with them who is not the patient and must remain in the main waiting area with an adult.  If the patient needs to stay at the  hospital during part of their recovery, the visitor guidelines for inpatient rooms apply.  Please refer to the [facility] website for the visitor guidelines for any additional information.   If you received a COVID test during your pre-op visit  it is requested that you wear a mask when out in public, stay away from anyone that may not be feeling well and notify your surgeon if you develop symptoms. If you have been in contact with anyone that has tested positive in the last 10 days please notify you surgeon.      Pre-operative CHG Bathing Instructions   You can play a key role in reducing the risk of infection after surgery. Your skin needs to be as free of germs as possible. You can reduce the number of germs on your skin by washing with CHG (chlorhexidine  gluconate) soap before surgery. CHG is an antiseptic soap that kills germs and continues to kill germs even after washing.   DO NOT use if you have an allergy to chlorhexidine /CHG or antibacterial soaps. If your skin becomes reddened or irritated, stop using the CHG and notify one of our RNs at [phone].              TAKE A SHOWER THE NIGHT BEFORE SURGERY AND THE DAY OF SURGERY    Please keep in mind the following:  DO NOT shave, including legs and underarms, 48 hours prior to  surgery.   You may shave your face before/day of surgery.  Place clean sheets on your bed the night before surgery Use a clean washcloth (not used since being washed) for each shower. DO NOT sleep with pet's night before surgery.  CHG Shower Instructions:  Wash your face and private area with normal soap. If you choose to wash your hair, wash first with your normal shampoo.  After you use shampoo/soap, rinse your hair and body thoroughly to remove shampoo/soap residue.  Turn the water  OFF and apply half the bottle of CHG soap to a CLEAN washcloth.  Apply CHG soap ONLY FROM YOUR NECK DOWN TO YOUR TOES (washing for 3-5 minutes)  DO NOT use CHG soap on face,  private areas, open wounds, or sores.  Pay special attention to the area where your surgery is being performed.  If you are having back surgery, having someone wash your back for you may be helpful. Wait 2 minutes after CHG soap is applied, then you may rinse off the CHG soap.  Pat dry with a clean towel  Put on clean pajamas    Additional instructions for the day of surgery: DO NOT APPLY any lotions, deodorants, cologne, or perfumes.   Do not wear jewelry or makeup Do not wear nail polish, gel polish, artificial nails, or any other type of covering on natural nails (fingers and toes) Do not bring valuables to the hospital. [facility] is not responsible for valuables/personal belongings. Put on clean/comfortable clothes.  Please brush your teeth.  Ask your nurse before applying any prescription medications to the skin.

## 2024-01-21 ENCOUNTER — Encounter (HOSPITAL_COMMUNITY): Payer: Self-pay

## 2024-01-21 ENCOUNTER — Encounter (HOSPITAL_COMMUNITY)
Admission: RE | Admit: 2024-01-21 | Discharge: 2024-01-21 | Disposition: A | Discharge status: Home / Self Care | Source: Ambulatory Visit | Attending: Vascular Surgery | Admitting: Vascular Surgery

## 2024-01-21 ENCOUNTER — Other Ambulatory Visit: Payer: Self-pay

## 2024-01-21 VITALS — BP 136/76 | HR 59 | Temp 98.1°F | Resp 18 | Ht 69.0 in | Wt 160.9 lb

## 2024-01-21 DIAGNOSIS — Z951 Presence of aortocoronary bypass graft: Secondary | ICD-10-CM | POA: Insufficient documentation

## 2024-01-21 DIAGNOSIS — I6523 Occlusion and stenosis of bilateral carotid arteries: Secondary | ICD-10-CM | POA: Insufficient documentation

## 2024-01-21 DIAGNOSIS — I10 Essential (primary) hypertension: Secondary | ICD-10-CM | POA: Insufficient documentation

## 2024-01-21 DIAGNOSIS — I480 Paroxysmal atrial fibrillation: Secondary | ICD-10-CM | POA: Insufficient documentation

## 2024-01-21 DIAGNOSIS — Z01812 Encounter for preprocedural laboratory examination: Secondary | ICD-10-CM | POA: Diagnosis present

## 2024-01-21 DIAGNOSIS — I2510 Atherosclerotic heart disease of native coronary artery without angina pectoris: Secondary | ICD-10-CM | POA: Diagnosis not present

## 2024-01-21 DIAGNOSIS — Z01818 Encounter for other preprocedural examination: Secondary | ICD-10-CM

## 2024-01-21 DIAGNOSIS — E785 Hyperlipidemia, unspecified: Secondary | ICD-10-CM | POA: Insufficient documentation

## 2024-01-21 DIAGNOSIS — I440 Atrioventricular block, first degree: Secondary | ICD-10-CM | POA: Diagnosis not present

## 2024-01-21 HISTORY — DX: Cardiac arrhythmia, unspecified: I49.9

## 2024-01-21 LAB — COMPREHENSIVE METABOLIC PANEL WITH GFR
ALT: 18 U/L (ref 0–44)
AST: 20 U/L (ref 15–41)
Albumin: 4.3 g/dL (ref 3.5–5.0)
Alkaline Phosphatase: 98 U/L (ref 38–126)
Anion gap: 10 (ref 5–15)
BUN: 7 mg/dL (ref 6–20)
CO2: 27 mmol/L (ref 22–32)
Calcium: 9.2 mg/dL (ref 8.9–10.3)
Chloride: 105 mmol/L (ref 98–111)
Creatinine, Ser: 0.87 mg/dL (ref 0.61–1.24)
GFR, Estimated: >60 mL/min (ref >60)
Glucose, Bld: 98 mg/dL (ref 70–99)
Potassium: 3.9 mmol/L (ref 3.5–5.1)
Sodium: 142 mmol/L (ref 135–145)
Total Bilirubin: 1.7 mg/dL — ABNORMAL HIGH (ref 0.0–1.2)
Total Protein: 7.4 g/dL (ref 6.5–8.1)

## 2024-01-21 LAB — CBC
HCT: 48.8 % (ref 39.0–52.0)
Hemoglobin: 15.5 g/dL (ref 13.0–17.0)
MCH: 28.7 pg (ref 26.0–34.0)
MCHC: 31.8 g/dL (ref 30.0–36.0)
MCV: 90.2 fL (ref 80.0–100.0)
Platelets: 191 10*3/uL (ref 150–400)
RBC: 5.41 MIL/uL (ref 4.22–5.81)
RDW: 15.2 % (ref 11.5–15.5)
WBC: 6.1 10*3/uL (ref 4.0–10.5)
nRBC: 0 % (ref 0.0–0.2)

## 2024-01-21 LAB — PROTIME-INR
INR: 1.1 (ref 0.8–1.2)
Prothrombin Time: 13.9 s (ref 11.4–15.2)

## 2024-01-21 LAB — URINALYSIS, ROUTINE W REFLEX MICROSCOPIC
Bilirubin Urine: NEGATIVE
Glucose, UA: NEGATIVE mg/dL
Hgb urine dipstick: NEGATIVE
Ketones, ur: NEGATIVE mg/dL
Leukocytes,Ua: NEGATIVE
Nitrite: NEGATIVE
Protein, ur: NEGATIVE mg/dL
Specific Gravity, Urine: 1.006 (ref 1.005–1.030)
pH: 7 (ref 5.0–8.0)

## 2024-01-21 LAB — TYPE AND SCREEN
ABO/RH(D): O POS
Antibody Screen: NEGATIVE

## 2024-01-21 LAB — APTT: aPTT: 31 s (ref 24–36)

## 2024-01-21 LAB — SURGICAL PCR SCREEN
MRSA, PCR: NEGATIVE
Staphylococcus aureus: POSITIVE — AB

## 2024-01-21 NOTE — Progress Notes (Signed)
 PCP - Gwendalyn Lemma, Claudette Cue, PA-C  Cardiologist -  Percival Brace, MD   PPM/ICD - denies Device Orders - n/a Rep Notified - n/a  Chest x-ray - 12-22-23 EKG - 11-10-23 Stress Test -  ECHO - 10-27-23 Cardiac Cath - 10-27-23  Sleep Study - denies CPAP -   Dm -denies  Blood Thinner Instructions:  Plavix  continue Aspirin  Instructions:continue  ERAS Protcol -NPO   COVID TEST- n/a   Anesthesia review: yes cardiac hx  Patient denies shortness of breath, fever, cough and chest pain at PAT appointment   All instructions explained to the patient, with a verbal understanding of the material. Patient agrees to go over the instructions while at home for a better understanding. Patient also instructed to self quarantine after being tested for COVID-19. The opportunity to ask questions was provided.

## 2024-01-24 ENCOUNTER — Encounter

## 2024-01-24 ENCOUNTER — Encounter (HOSPITAL_COMMUNITY): Payer: Self-pay

## 2024-01-24 NOTE — Anesthesia Preprocedure Evaluation (Signed)
 Anesthesia Evaluation  Patient identified by MRN, date of birth, ID band Patient awake    Reviewed: Allergy & Precautions, NPO status , Patient's Chart, lab work & pertinent test results  History of Anesthesia Complications Negative for: history of anesthetic complications  Airway Mallampati: III  TM Distance: >3 FB Neck ROM: Full    Dental  (+) Teeth Intact, Dental Advisory Given   Pulmonary neg shortness of breath, neg sleep apnea, neg COPD, neg recent URI, former smoker   breath sounds clear to auscultation       Cardiovascular hypertension, (-) angina + CAD and + CABG  + dysrhythmias Atrial Fibrillation  Rhythm:Regular   1. Left ventricular ejection fraction, by estimation, is 55 to 60%. The  left ventricle has normal function. The left ventricle has no regional  wall motion abnormalities. There is moderate asymmetric left ventricular  hypertrophy of the basal-septal and  septal segments. Left ventricular diastolic parameters are consistent with  Grade I diastolic dysfunction (impaired relaxation).   2. Right ventricular systolic function is normal. The right ventricular  size is normal.   3. The mitral valve is normal in structure. No evidence of mitral valve  regurgitation.   4. The aortic valve is normal in structure. Aortic valve regurgitation is  mild. Aortic valve sclerosis is present, with no evidence of aortic valve  stenosis.      Prox RCA to Mid RCA lesion is 50% stenosed.   Mid RCA lesion is 100% stenosed.   Prox RCA lesion is 85% stenosed.   Ost Cx to Prox Cx lesion is 30% stenosed.   Prox LAD lesion is 80% stenosed.   Mid LM lesion is 20% stenosed.   1st Mrg lesion is 80% stenosed.   The left ventricular systolic function is normal.   LV end diastolic pressure is normal.   The left ventricular ejection fraction is 55-65% by visual estimate.   1.  Three-vessel coronary artery disease with 80%  stenosis proximal LAD involving takeoff of large D2, 80% stenosis proximal large caliber OM1, patent stent ostial left circumflex, bifid RCA with occluded stents mid segment with contra collaterals 2.  Normal left ventricular function    Neuro/Psych  PSYCHIATRIC DISORDERS Anxiety        GI/Hepatic Neg liver ROS,,,  Endo/Other    Renal/GU negative Renal ROS     Musculoskeletal   Abdominal   Peds  Hematology Lab Results      Component                Value               Date                      WBC                      6.1                 01/21/2024                HGB                      15.5                01/21/2024                HCT  48.8                01/21/2024                MCV                      90.2                01/21/2024                PLT                      191                 01/21/2024                Anesthesia Other Findings   Reproductive/Obstetrics                              Anesthesia Physical Anesthesia Plan  ASA: 3  Anesthesia Plan: General   Post-op Pain Management: Ofirmev  IV (intra-op)*   Induction: Intravenous  PONV Risk Score and Plan: 3 and Ondansetron  and Dexamethasone  Airway Management Planned: Oral ETT  Additional Equipment: Arterial line  Intra-op Plan:   Post-operative Plan: Extubation in OR  Informed Consent: I have reviewed the patients History and Physical, chart, labs and discussed the procedure including the risks, benefits and alternatives for the proposed anesthesia with the patient or authorized representative who has indicated his/her understanding and acceptance.     Dental advisory given  Plan Discussed with: CRNA  Anesthesia Plan Comments: (PAT note written 01/24/2024 by Antoino Westhoff, PA-C.  )        Anesthesia Quick Evaluation

## 2024-01-24 NOTE — Progress Notes (Addendum)
 Anesthesia Chart Review: Dorenda Gandy  Case: [id number] Date/Time: 01/25/24 0715   Procedure: ENDARTERECTOMY, CAROTID (Right)   Anesthesia type: General   Diagnosis: Bilateral carotid artery occlusion [I65.23]   Pre-op diagnosis: bilateral carotid stenosis   [facility] RO[facility] 12 / [facility]   Surgeons: Kayla Part, MD       DISCUSSION: Patient is a 58 year old male scheduled for the above procedure.  History includes smoking, HTN, HLD, CAD (RCA overlapping DES mRCA 12/19/09; DES RCA for ISR 02/24/17; DES pLCX 11/11/18; CABG: LIMA-LAD, SVG-PDA, SVG-OM1 11/08/23), post-CABG PAF (discharged in SR on amiodarone  11/12/23), PVCs, carotid artery stenosis, thrombocytopenia (PLT count normal 01/21/24), anxiety, spinal surgery (left L4-5 microdiscectomy 01/06/06, redo 05/09/08). Alcohol use is documented as 2 beers/day.  Last cardiology visit with Dr. Parks Bollman was on 11/29/23 for follow-up CABG. He reportedly was doing well without chest pain, dyspnea, palpitations, edema. He was getting ready to start Cardiac Rehab. BP was controlled. EKG was not done by HR RRR by exam (no longer on amiodarone ). Meds continued. 3 month follow-up planned.  VVS indicated that preoperative communication was received from Dr. Parks Bollman. As needed CT surgery follow-up at 12/22/23 visit.  Preoperative labs noted. Total bilirubin 1.7 with normal AS and ALT. Isolated elevated total bilirubin noted on labs dating back to 05/27/22. CBC was normal.   He is to continue ASA and Plavix  per VVS.  Anesthesia team to evaluate on the day of surgery.   VS: BP 136/76   Pulse (!) 59   Temp 36.7 C   Resp 18   Ht 5\' 9"  (1.753 m)   Wt 73 kg   SpO2 100%   BMI 23.76 kg/m    PROVIDERS: Lenell Query, PA-C is PCP. Last primary care visit 01/10/24. Percival Brace, MD is cardiologist Starleen Eastern, MD is CT surgeon. Last visit 12/22/23 with APP. Left pleural effusion decreased with diuresis. As needed TCTS follow-up  planned.    LABS: Labs reviewed: Acceptable for surgery. See DISCUSSION. A1c 4.7% on 11/04/23. (all labs ordered are listed, but only abnormal results are displayed)  Labs Reviewed  SURGICAL PCR SCREEN - Abnormal; Notable for the following components:      Result Value   Staphylococcus aureus POSITIVE (*)    All other components within normal limits  COMPREHENSIVE METABOLIC PANEL WITH GFR - Abnormal; Notable for the following components:   Total Bilirubin 1.7 (*)    All other components within normal limits  URINALYSIS, ROUTINE W REFLEX MICROSCOPIC - Abnormal; Notable for the following components:   Color, Urine STRAW (*)    All other components within normal limits  CBC  PROTIME-INR  APTT  TYPE AND SCREEN     IMAGES: CTA Head/neck 12/28/23: IMPRESSION: 1. Critical (95%) stenosis of the proximal right internal carotid artery secondary to calcified and noncalcified atherosclerosis. 2. Severe (70%) stenosis of the proximal left internal carotid artery secondary to calcified and noncalcified atherosclerosis. 3. Partially visualized left pleural effusion.  CXR 12/22/23 (ordered by CT surgeon Dr. Deloise Ferries): FINDINGS: No cardiomegaly. Sternotomy wires and changes of a prior CABG. Triangular opacity in the left lung base on the frontal radiograph. No lobar consolidation or pneumothorax. Multilevel thoracic osteophytosis. IMPRESSION: Triangular opacity in the left lung base on the frontal radiograph, may reflect changes of a small left pleural effusion, partially loculated versus subsegmental or lobar atelectasis. Nonemergent chest CT with IV contrast is recommended for further characterization.  MRI Abd 07/03/22: IMPRESSION: 1. No pancreatic ductal dilation or suspicious  pancreatic lesion identified, specifically no mass visualized in the pancreatic head correspond with findings on prior CT. Subcentimeter left adrenal nodule is statistically likely to reflect a benign adenoma  and requires no independent imaging follow-up.    EKG:  EKG 11/10/23: Atrial fibrillation with rapid ventricular response @ 107 bpm Moderate voltage criteria for LVH, may be normal variant ( Sokolow-Lyon , Cornell product ) Confirmed by Harvie Liner [phone]) on 11/10/2023 5:59:13 PM  EKG 11/08/23 (post-CABG): Sinus rhythm with 1st degree A-V block T wave abnormality, consider inferior ischemia Confirmed by Harvie Liner [phone]) on 11/08/2023 9:14:05 PM  EKG 10/27/23 (pre-CABG) Sinus rhythm Probable left ventricular hypertrophy Borderline T abnormalities, lateral leads Confirmed by UNCONFIRMED, DOCTOR ([id number]), editor Toy Freund, Tammy [phone]) on 10/27/2023 9:30:50 AM   CV: US  Carotid 01/06/24: Summary:  - Right Carotid: Velocities in the right ICA are consistent with a 80-99% stenosis.  - Left Carotid: Velocities in the left ICA are consistent with a 80-99% stenosis.  - Vertebrals: Bilateral vertebral arteries demonstrate antegrade flow.  - Subclavians: Normal flow hemodynamics were seen in bilateral subclavian arteries.   TEE (intr-op CABG) 11/08/23: POST-OP IMPRESSIONS  Overall, there were no significant changes from pre-bypass.  PRE-OP FINDINGS   Left Ventricle: The left ventricle has normal systolic function, with an  ejection fraction of 60-65%. The cavity size was normal. There is no  increase in left ventricular wall thickness. There is no left ventricular  hypertrophy.   Echo 10/27/23: IMPRESSIONS   1. Left ventricular ejection fraction, by estimation, is 55 to 60%. The  left ventricle has normal function. The left ventricle has no regional  wall motion abnormalities. There is moderate asymmetric left ventricular  hypertrophy of the basal-septal and  septal segments. Left ventricular diastolic parameters are consistent with  Grade I diastolic dysfunction (impaired relaxation).   2. Right ventricular systolic function is normal. The right ventricular  size is normal.   3.  The mitral valve is normal in structure. No evidence of mitral valve  regurgitation.   4. The aortic valve is normal in structure. Aortic valve regurgitation is  mild. Aortic valve sclerosis is present, with no evidence of aortic valve  stenosis.   Last cardiac cath was on 10/27/23 (pre-CABG).   Past Medical History:  Diagnosis Date   Anxiety    CAD (coronary artery disease)    Carotid artery occlusion    Dysrhythmia    post-CABG PAF   HLD (hyperlipidemia)    Hypertension    PVC (premature ventricular contraction)    Thrombocytopenia (HCC)     Past Surgical History:  Procedure Laterality Date   COLONOSCOPY WITH PROPOFOL  N/A 12/12/2015   Procedure: COLONOSCOPY WITH PROPOFOL ;  Surgeon: Cassie Click, MD;  Location[facility] ENDOSCOPY;  Service: Endoscopy;  Laterality: N/A;   COLONOSCOPY WITH PROPOFOL  N/A 06/23/2022   Procedure: COLONOSCOPY WITH PROPOFOL ;  Surgeon: Shane Darling, MD;  Location: [facility] ENDOSCOPY;  Service: Endoscopy;  Laterality: N/A;   CORONARY ARTERY BYPASS GRAFT N/A 11/08/2023   Procedure: CORONARY ARTERY BYPASS GRAFTING (CABG) TIMES THREE USING LEFT INTERNAL MAMMARY ARTERY AND ENDOSCOPICALLY HARVESTED RIGHT GREATER SAPHENOUS VEIN;  Surgeon: Hilarie Lovely, MD;  Location: [facility] OR;  Service: Open Heart Surgery;  Laterality: N/A;   CORONARY STENT INTERVENTION N/A 02/24/2017   Procedure: Coronary Stent Intervention;  Surgeon: Percival Brace, MD;  Location: [facility] INVASIVE CV LAB;  Service: Cardiovascular;  Laterality: N/A;   CORONARY STENT INTERVENTION N/A 11/11/2018   Procedure: CORONARY STENT INTERVENTION;  Surgeon: Beau Bound,  Candie Chamber, MD;  Location: [facility] INVASIVE CV LAB;  Service: Cardiovascular;  Laterality: N/A;   ESOPHAGOGASTRODUODENOSCOPY (EGD) WITH PROPOFOL  N/A 06/23/2022   Procedure: ESOPHAGOGASTRODUODENOSCOPY (EGD) WITH PROPOFOL ;  Surgeon: Shane Darling, MD;  Location: [facility] ENDOSCOPY;  Service: Endoscopy;  Laterality: N/A;   LEFT HEART CATH AND  CORONARY ANGIOGRAPHY Left 02/24/2017   Procedure: Left Heart Cath and Coronary Angiography;  Surgeon: Percival Brace, MD;  Location: [facility] INVASIVE CV LAB;  Service: Cardiovascular;  Laterality: Left;   LEFT HEART CATH AND CORONARY ANGIOGRAPHY N/A 11/11/2018   Procedure: LEFT HEART CATH AND CORONARY ANGIOGRAPHY with possible pci and stent;  Surgeon: Antonette Batters, MD;  Location: [facility] INVASIVE CV LAB;  Service: Cardiovascular;  Laterality: N/A;   LEFT HEART CATH AND CORONARY ANGIOGRAPHY Left 10/27/2023   Procedure: LEFT HEART CATH AND CORONARY ANGIOGRAPHY;  Surgeon: Percival Brace, MD;  Location: [facility] INVASIVE CV LAB;  Service: Cardiovascular;  Laterality: Left;   stents     TEE WITHOUT CARDIOVERSION N/A 11/08/2023   Procedure: TRANSESOPHAGEAL ECHOCARDIOGRAM (TEE);  Surgeon: Hilarie Lovely, MD;  Location: [facility] OR;  Service: Open Heart Surgery;  Laterality: N/A;    MEDICATIONS:  amiodarone  (PACERONE ) 200 MG tablet   amLODipine  (NORVASC ) 10 MG tablet   aspirin  81 MG tablet   clopidogrel  (PLAVIX ) 75 MG tablet   Coenzyme Q10 100 MG TABS   furosemide  (LASIX ) 40 MG tablet   loratadine  (CLARITIN ) 10 MG tablet   metoprolol  succinate (TOPROL -XL) 100 MG 24 hr tablet   Omega-3 Fatty Acids (FISH OIL PO)   pravastatin  (PRAVACHOL ) 20 MG tablet   No current facility-administered medications for this encounter.   Mes listed as not currently taking: amoidarone, Lasix .   Ella Gun, PA-C Surgical Short Stay/Anesthesiology [facility] Phone [phone] [facility] Phone [phone] 01/24/2024 10:34 AM

## 2024-01-25 ENCOUNTER — Encounter

## 2024-01-25 ENCOUNTER — Other Ambulatory Visit: Payer: Self-pay

## 2024-01-25 ENCOUNTER — Inpatient Hospital Stay (HOSPITAL_COMMUNITY): Admitting: Certified Registered"

## 2024-01-25 ENCOUNTER — Encounter (HOSPITAL_COMMUNITY): Payer: Self-pay | Admitting: Vascular Surgery

## 2024-01-25 ENCOUNTER — Inpatient Hospital Stay (HOSPITAL_COMMUNITY)
Admission: RE | Admit: 2024-01-25 | Discharge: 2024-01-26 | DRG: 039 | Disposition: A | Discharge status: Home / Self Care | Attending: Vascular Surgery | Admitting: Vascular Surgery

## 2024-01-25 ENCOUNTER — Inpatient Hospital Stay (HOSPITAL_COMMUNITY): Payer: Self-pay | Admitting: Vascular Surgery

## 2024-01-25 ENCOUNTER — Encounter (HOSPITAL_COMMUNITY)
Admission: RE | Disposition: A | Discharge status: Home / Self Care | Payer: Self-pay | Source: Home / Self Care | Attending: Vascular Surgery

## 2024-01-25 DIAGNOSIS — Z7902 Long term (current) use of antithrombotics/antiplatelets: Secondary | ICD-10-CM

## 2024-01-25 DIAGNOSIS — Z7982 Long term (current) use of aspirin: Secondary | ICD-10-CM

## 2024-01-25 DIAGNOSIS — Z87891 Personal history of nicotine dependence: Secondary | ICD-10-CM

## 2024-01-25 DIAGNOSIS — Z951 Presence of aortocoronary bypass graft: Secondary | ICD-10-CM

## 2024-01-25 DIAGNOSIS — Z79899 Other long term (current) drug therapy: Secondary | ICD-10-CM | POA: Diagnosis not present

## 2024-01-25 DIAGNOSIS — I6523 Occlusion and stenosis of bilateral carotid arteries: Principal | ICD-10-CM | POA: Diagnosis present

## 2024-01-25 DIAGNOSIS — I2510 Atherosclerotic heart disease of native coronary artery without angina pectoris: Secondary | ICD-10-CM | POA: Diagnosis present

## 2024-01-25 DIAGNOSIS — E785 Hyperlipidemia, unspecified: Secondary | ICD-10-CM | POA: Diagnosis present

## 2024-01-25 DIAGNOSIS — Z888 Allergy status to other drugs, medicaments and biological substances status: Secondary | ICD-10-CM

## 2024-01-25 DIAGNOSIS — I6521 Occlusion and stenosis of right carotid artery: Secondary | ICD-10-CM

## 2024-01-25 DIAGNOSIS — I10 Essential (primary) hypertension: Secondary | ICD-10-CM

## 2024-01-25 DIAGNOSIS — Z8249 Family history of ischemic heart disease and other diseases of the circulatory system: Secondary | ICD-10-CM | POA: Diagnosis not present

## 2024-01-25 DIAGNOSIS — I6529 Occlusion and stenosis of unspecified carotid artery: Principal | ICD-10-CM | POA: Diagnosis present

## 2024-01-25 HISTORY — PX: ENDARTERECTOMY: SHX5162

## 2024-01-25 LAB — CBC
HCT: 42.7 % (ref 39.0–52.0)
Hemoglobin: 14.2 g/dL (ref 13.0–17.0)
MCH: 29.3 pg (ref 26.0–34.0)
MCHC: 33.3 g/dL (ref 30.0–36.0)
MCV: 88 fL (ref 80.0–100.0)
Platelets: 188 10*3/uL (ref 150–400)
RBC: 4.85 MIL/uL (ref 4.22–5.81)
RDW: 15 % (ref 11.5–15.5)
WBC: 9.9 10*3/uL (ref 4.0–10.5)
nRBC: 0 % (ref 0.0–0.2)

## 2024-01-25 LAB — POCT ACTIVATED CLOTTING TIME
Activated Clotting Time: 153 s
Activated Clotting Time: 233 s
Activated Clotting Time: 291 s
Activated Clotting Time: 268 s

## 2024-01-25 LAB — CREATININE, SERUM
Creatinine, Ser: 0.79 mg/dL (ref 0.61–1.24)
GFR, Estimated: >60 mL/min (ref >60)

## 2024-01-25 SURGERY — ENDARTERECTOMY, CAROTID
Anesthesia: General | Site: Neck | Laterality: Right

## 2024-01-25 MED ORDER — ASPIRIN 81 MG PO TBEC
81.0000 mg | DELAYED_RELEASE_TABLET | Freq: Every day | ORAL | Status: DC
  Administered 2024-01-26: 81 mg via ORAL
  Filled 2024-01-25: qty 1

## 2024-01-25 MED ORDER — DOCUSATE SODIUM 100 MG PO CAPS
100.0000 mg | ORAL_CAPSULE | Freq: Every day | ORAL | Status: DC
  Administered 2024-01-26: 100 mg via ORAL
  Filled 2024-01-25: qty 1

## 2024-01-25 MED ORDER — ACETAMINOPHEN 10 MG/ML IV SOLN
1000.0000 mg | Freq: Once | INTRAVENOUS | Status: DC | PRN
  Administered 2024-01-25: 1000 mg via INTRAVENOUS

## 2024-01-25 MED ORDER — ROCURONIUM BROMIDE 10 MG/ML (PF) SYRINGE
PREFILLED_SYRINGE | INTRAVENOUS | Status: AC
  Filled 2024-01-25: qty 10

## 2024-01-25 MED ORDER — MAGNESIUM SULFATE 2 GM/50ML IV SOLN: 2.0000 g | Freq: Every day | INTRAVENOUS | Status: DC | PRN

## 2024-01-25 MED ORDER — HEPARIN SODIUM (PORCINE) 1000 UNIT/ML IJ SOLN
INTRAMUSCULAR | Status: DC | PRN
  Administered 2024-01-25: 8000 [IU] via INTRAVENOUS
  Administered 2024-01-25: 2000 [IU] via INTRAVENOUS

## 2024-01-25 MED ORDER — LIDOCAINE 2% (20 MG/ML) 5 ML SYRINGE
INTRAMUSCULAR | Status: AC
  Filled 2024-01-25: qty 5

## 2024-01-25 MED ORDER — ACETAMINOPHEN 10 MG/ML IV SOLN
INTRAVENOUS | Status: AC
  Filled 2024-01-25: qty 100

## 2024-01-25 MED ORDER — MIDAZOLAM HCL 2 MG/2ML IJ SOLN
INTRAMUSCULAR | Status: DC | PRN
  Administered 2024-01-25: 2 mg via INTRAVENOUS

## 2024-01-25 MED ORDER — OXYCODONE HCL 5 MG PO TABS: 5.0000 mg | ORAL_TABLET | Freq: Once | ORAL | Status: DC | PRN

## 2024-01-25 MED ORDER — ROCURONIUM BROMIDE 10 MG/ML (PF) SYRINGE
PREFILLED_SYRINGE | INTRAVENOUS | Status: DC | PRN
Start: 2024-01-25 — End: 2024-01-25
  Administered 2024-01-25: 70 mg via INTRAVENOUS
  Administered 2024-01-25: 10 mg via INTRAVENOUS

## 2024-01-25 MED ORDER — ONDANSETRON HCL 4 MG/2ML IJ SOLN
INTRAMUSCULAR | Status: AC
  Filled 2024-01-25: qty 2

## 2024-01-25 MED ORDER — HEMOSTATIC AGENTS (NO CHARGE) OPTIME
TOPICAL | Status: DC | PRN
Start: 2024-01-25 — End: 2024-01-25
  Administered 2024-01-25: 1 via TOPICAL

## 2024-01-25 MED ORDER — SODIUM CHLORIDE 0.9 % IV SOLN: 250.0000 mL | INTRAVENOUS | Status: DC | PRN

## 2024-01-25 MED ORDER — PROPOFOL 10 MG/ML IV BOLUS
INTRAVENOUS | Status: DC | PRN
Start: 2024-01-25 — End: 2024-01-25
  Administered 2024-01-25: 40 mg via INTRAVENOUS
  Administered 2024-01-25: 50 mg via INTRAVENOUS

## 2024-01-25 MED ORDER — ACETAMINOPHEN 325 MG PO TABS
325.0000 mg | ORAL_TABLET | ORAL | Status: DC | PRN
  Administered 2024-01-25: 650 mg via ORAL
  Filled 2024-01-25: qty 2

## 2024-01-25 MED ORDER — LORATADINE 10 MG PO TABS
10.0000 mg | ORAL_TABLET | Freq: Every day | ORAL | Status: DC
  Administered 2024-01-26: 10 mg via ORAL
  Filled 2024-01-25: qty 1

## 2024-01-25 MED ORDER — PHENYLEPHRINE HCL-NACL 20-0.9 MG/250ML-% IV SOLN
INTRAVENOUS | Status: AC
  Filled 2024-01-25: qty 500

## 2024-01-25 MED ORDER — LIDOCAINE 2% (20 MG/ML) 5 ML SYRINGE
INTRAMUSCULAR | Status: DC | PRN
  Administered 2024-01-25: 60 mg via INTRAVENOUS

## 2024-01-25 MED ORDER — FLEET ENEMA RE ENEM: 1.0000 | ENEMA | Freq: Once | RECTAL | Status: DC | PRN

## 2024-01-25 MED ORDER — SODIUM CHLORIDE 0.9 % IV SOLN: INTRAVENOUS | Status: DC

## 2024-01-25 MED ORDER — METOPROLOL TARTRATE 5 MG/5ML IV SOLN: 2.0000 mg | INTRAVENOUS | Status: DC | PRN

## 2024-01-25 MED ORDER — ACETAMINOPHEN 650 MG RE SUPP: 325.0000 mg | RECTAL | Status: DC | PRN

## 2024-01-25 MED ORDER — CHLORHEXIDINE GLUCONATE CLOTH 2 % EX PADS: 6.0000 | MEDICATED_PAD | Freq: Once | CUTANEOUS | Status: DC

## 2024-01-25 MED ORDER — ONDANSETRON HCL 4 MG/2ML IJ SOLN
INTRAMUSCULAR | Status: DC | PRN
  Administered 2024-01-25: 4 mg via INTRAVENOUS

## 2024-01-25 MED ORDER — LIDOCAINE-EPINEPHRINE (PF) 1 %-1:200000 IJ SOLN
INTRAMUSCULAR | Status: DC | PRN
  Administered 2024-01-25: 6 mL

## 2024-01-25 MED ORDER — HYDRALAZINE HCL 20 MG/ML IJ SOLN: 5.0000 mg | INTRAMUSCULAR | Status: DC | PRN

## 2024-01-25 MED ORDER — OXYCODONE HCL 5 MG PO TABS: 5.0000 mg | ORAL_TABLET | ORAL | Status: DC | PRN

## 2024-01-25 MED ORDER — HYDROMORPHONE HCL 1 MG/ML IJ SOLN: 0.5000 mg | INTRAMUSCULAR | Status: DC | PRN

## 2024-01-25 MED ORDER — CEFAZOLIN SODIUM-DEXTROSE 2-4 GM/100ML-% IV SOLN
2.0000 g | INTRAVENOUS | Status: AC
  Administered 2024-01-25: 2 g via INTRAVENOUS
  Filled 2024-01-25: qty 100

## 2024-01-25 MED ORDER — SUGAMMADEX SODIUM 200 MG/2ML IV SOLN
INTRAVENOUS | Status: DC | PRN
  Administered 2024-01-25: 200 mg via INTRAVENOUS

## 2024-01-25 MED ORDER — SENNOSIDES-DOCUSATE SODIUM 8.6-50 MG PO TABS: 1.0000 | ORAL_TABLET | Freq: Every evening | ORAL | Status: DC | PRN

## 2024-01-25 MED ORDER — PANTOPRAZOLE SODIUM 40 MG PO TBEC
40.0000 mg | DELAYED_RELEASE_TABLET | Freq: Every day | ORAL | Status: DC
  Administered 2024-01-25 – 2024-01-26 (×2): 40 mg via ORAL
  Filled 2024-01-25 (×2): qty 1

## 2024-01-25 MED ORDER — DEXAMETHASONE SODIUM PHOSPHATE 10 MG/ML IJ SOLN
INTRAMUSCULAR | Status: DC | PRN
  Administered 2024-01-25: 10 mg via INTRAVENOUS

## 2024-01-25 MED ORDER — PHENYLEPHRINE 80 MCG/ML (10ML) SYRINGE FOR IV PUSH (FOR BLOOD PRESSURE SUPPORT)
PREFILLED_SYRINGE | INTRAVENOUS | Status: DC | PRN
Start: 2024-01-25 — End: 2024-01-25
  Administered 2024-01-25: 80 ug via INTRAVENOUS

## 2024-01-25 MED ORDER — MIDAZOLAM HCL 2 MG/2ML IJ SOLN
INTRAMUSCULAR | Status: AC
  Filled 2024-01-25: qty 2

## 2024-01-25 MED ORDER — LACTATED RINGERS IV SOLN: INTRAVENOUS | Status: DC

## 2024-01-25 MED ORDER — HEPARIN SODIUM (PORCINE) 5000 UNIT/ML IJ SOLN
5000.0000 [IU] | Freq: Three times a day (TID) | INTRAMUSCULAR | Status: DC
  Administered 2024-01-26: 5000 [IU] via SUBCUTANEOUS
  Filled 2024-01-25: qty 1

## 2024-01-25 MED ORDER — 0.9 % SODIUM CHLORIDE (POUR BTL) OPTIME
TOPICAL | Status: DC | PRN
  Administered 2024-01-25: 1000 mL

## 2024-01-25 MED ORDER — CHLORHEXIDINE GLUCONATE 0.12 % MT SOLN: 15.0000 mL | Freq: Once | OROMUCOSAL | Status: AC

## 2024-01-25 MED ORDER — METOPROLOL SUCCINATE ER 100 MG PO TB24
100.0000 mg | ORAL_TABLET | Freq: Every day | ORAL | Status: DC
  Filled 2024-01-25: qty 1

## 2024-01-25 MED ORDER — AMLODIPINE BESYLATE 10 MG PO TABS
10.0000 mg | ORAL_TABLET | Freq: Every day | ORAL | Status: DC
  Administered 2024-01-26: 10 mg via ORAL
  Filled 2024-01-25: qty 1

## 2024-01-25 MED ORDER — HEPARIN SODIUM (PORCINE) 1000 UNIT/ML IJ SOLN
INTRAMUSCULAR | Status: AC
  Filled 2024-01-25: qty 10

## 2024-01-25 MED ORDER — FENTANYL CITRATE (PF) 250 MCG/5ML IJ SOLN
INTRAMUSCULAR | Status: DC | PRN
  Administered 2024-01-25: 100 ug via INTRAVENOUS

## 2024-01-25 MED ORDER — CHLORHEXIDINE GLUCONATE 0.12 % MT SOLN
OROMUCOSAL | Status: AC
  Administered 2024-01-25: 15 mL via OROMUCOSAL
  Filled 2024-01-25: qty 15

## 2024-01-25 MED ORDER — FENTANYL CITRATE (PF) 100 MCG/2ML IJ SOLN
INTRAMUSCULAR | Status: AC
  Filled 2024-01-25: qty 2

## 2024-01-25 MED ORDER — FENTANYL CITRATE (PF) 250 MCG/5ML IJ SOLN
INTRAMUSCULAR | Status: AC
  Filled 2024-01-25: qty 5

## 2024-01-25 MED ORDER — SODIUM CHLORIDE 0.9% FLUSH
3.0000 mL | Freq: Two times a day (BID) | INTRAVENOUS | Status: DC
  Administered 2024-01-26 (×2): 3 mL via INTRAVENOUS

## 2024-01-25 MED ORDER — CLOPIDOGREL BISULFATE 75 MG PO TABS
75.0000 mg | ORAL_TABLET | Freq: Every day | ORAL | Status: DC
  Administered 2024-01-26: 75 mg via ORAL
  Filled 2024-01-25: qty 1

## 2024-01-25 MED ORDER — SURGIFLO WITH THROMBIN (HEMOSTATIC MATRIX KIT) OPTIME
TOPICAL | Status: DC | PRN
Start: 2024-01-25 — End: 2024-01-25
  Administered 2024-01-25: 1 via TOPICAL

## 2024-01-25 MED ORDER — PROTAMINE SULFATE 10 MG/ML IV SOLN
INTRAVENOUS | Status: DC | PRN
  Administered 2024-01-25 (×5): 10 mg via INTRAVENOUS

## 2024-01-25 MED ORDER — SODIUM CHLORIDE 0.9 % IV SOLN
0.2000 ug/kg/min | INTRAVENOUS | Status: DC
  Administered 2024-01-25: .2 ug/kg/min via INTRAVENOUS
  Filled 2024-01-25 (×2): qty 2000

## 2024-01-25 MED ORDER — DEXAMETHASONE SODIUM PHOSPHATE 10 MG/ML IJ SOLN
INTRAMUSCULAR | Status: AC
  Filled 2024-01-25: qty 1

## 2024-01-25 MED ORDER — ORAL CARE MOUTH RINSE: 15.0000 mL | Freq: Once | OROMUCOSAL | Status: AC

## 2024-01-25 MED ORDER — ALUM & MAG HYDROXIDE-SIMETH 200-200-20 MG/5ML PO SUSP: 15.0000 mL | ORAL | Status: DC | PRN

## 2024-01-25 MED ORDER — FENTANYL CITRATE (PF) 100 MCG/2ML IJ SOLN
25.0000 ug | INTRAMUSCULAR | Status: DC | PRN
  Administered 2024-01-25: 25 ug via INTRAVENOUS
  Administered 2024-01-25: 50 ug via INTRAVENOUS

## 2024-01-25 MED ORDER — EPHEDRINE SULFATE-NACL 50-0.9 MG/10ML-% IV SOSY
PREFILLED_SYRINGE | INTRAVENOUS | Status: DC | PRN
  Administered 2024-01-25: 5 mg via INTRAVENOUS

## 2024-01-25 MED ORDER — PROTAMINE SULFATE 10 MG/ML IV SOLN
INTRAVENOUS | Status: AC
  Filled 2024-01-25: qty 25

## 2024-01-25 MED ORDER — ORAL CARE MOUTH RINSE: 15.0000 mL | OROMUCOSAL | Status: DC | PRN

## 2024-01-25 MED ORDER — GLYCOPYRROLATE 0.2 MG/ML IJ SOLN
INTRAMUSCULAR | Status: DC | PRN
Start: 2024-01-25 — End: 2024-01-25
  Administered 2024-01-25: .2 mg via INTRAVENOUS

## 2024-01-25 MED ORDER — LIDOCAINE-EPINEPHRINE 1 %-1:100000 IJ SOLN
INTRAMUSCULAR | Status: AC
Start: 2024-01-25 — End: ?
  Filled 2024-01-25: qty 1

## 2024-01-25 MED ORDER — POTASSIUM CHLORIDE CRYS ER 20 MEQ PO TBCR: 20.0000 meq | EXTENDED_RELEASE_TABLET | Freq: Every day | ORAL | Status: DC | PRN

## 2024-01-25 MED ORDER — ONDANSETRON HCL 4 MG/2ML IJ SOLN: 4.0000 mg | Freq: Four times a day (QID) | INTRAMUSCULAR | Status: DC | PRN

## 2024-01-25 MED ORDER — LABETALOL HCL 5 MG/ML IV SOLN: 10.0000 mg | INTRAVENOUS | Status: DC | PRN

## 2024-01-25 MED ORDER — PROPOFOL 1000 MG/100ML IV EMUL
INTRAVENOUS | Status: AC
  Filled 2024-01-25: qty 100

## 2024-01-25 MED ORDER — PHENYLEPHRINE HCL-NACL 20-0.9 MG/250ML-% IV SOLN
INTRAVENOUS | Status: DC | PRN
Start: 2024-01-25 — End: 2024-01-25
  Administered 2024-01-25: 30 ug/min via INTRAVENOUS

## 2024-01-25 MED ORDER — BISACODYL 5 MG PO TBEC: 5.0000 mg | DELAYED_RELEASE_TABLET | Freq: Every day | ORAL | Status: DC | PRN

## 2024-01-25 MED ORDER — LIDOCAINE HCL 1 % IJ SOLN
INTRAMUSCULAR | Status: AC
  Filled 2024-01-25: qty 20

## 2024-01-25 MED ORDER — HEPARIN 6000 UNIT IRRIGATION SOLUTION
Status: AC
  Filled 2024-01-25: qty 500

## 2024-01-25 MED ORDER — HEPARIN 6000 UNIT IRRIGATION SOLUTION
Status: DC | PRN
  Administered 2024-01-25: 1

## 2024-01-25 MED ORDER — PHENOL 1.4 % MT LIQD: 1.0000 | OROMUCOSAL | Status: DC | PRN

## 2024-01-25 MED ORDER — CEFAZOLIN SODIUM-DEXTROSE 2-4 GM/100ML-% IV SOLN
2.0000 g | Freq: Three times a day (TID) | INTRAVENOUS | Status: AC
  Administered 2024-01-25 – 2024-01-26 (×2): 2 g via INTRAVENOUS
  Filled 2024-01-25 (×2): qty 100

## 2024-01-25 MED ORDER — PROPOFOL 10 MG/ML IV BOLUS
INTRAVENOUS | Status: AC
  Filled 2024-01-25: qty 20

## 2024-01-25 MED ORDER — SODIUM CHLORIDE 0.9% FLUSH: 3.0000 mL | INTRAVENOUS | Status: DC | PRN

## 2024-01-25 MED ORDER — SODIUM CHLORIDE 0.9 % IV SOLN: 500.0000 mL | Freq: Once | INTRAVENOUS | Status: DC | PRN

## 2024-01-25 MED ORDER — OXYCODONE HCL 5 MG/5ML PO SOLN: 5.0000 mg | Freq: Once | ORAL | Status: DC | PRN

## 2024-01-25 MED ORDER — GUAIFENESIN-DM 100-10 MG/5ML PO SYRP: 15.0000 mL | ORAL_SOLUTION | ORAL | Status: DC | PRN

## 2024-01-25 MED ORDER — PRAVASTATIN SODIUM 40 MG PO TABS
20.0000 mg | ORAL_TABLET | Freq: Every day | ORAL | Status: DC
  Administered 2024-01-25: 20 mg via ORAL
  Filled 2024-01-25: qty 1

## 2024-01-25 SURGICAL SUPPLY — 48 items
BAG COUNTER SPONGE SURGICOUNT (BAG) ×1 IMPLANT
CANISTER SUCTION 3000ML PPV (SUCTIONS) ×1 IMPLANT
CANNULA VESSEL 3MM 2 BLNT TIP (CANNULA) ×1 IMPLANT
CATH ROBINSON RED A/P 18FR (CATHETERS) ×1 IMPLANT
CLIP TI MEDIUM 24 (CLIP) ×1 IMPLANT
CLIP TI MEDIUM 6 (CLIP) ×1 IMPLANT
CLIP TI WIDE RED SMALL 24 (CLIP) ×1 IMPLANT
CLIP TI WIDE RED SMALL 6 (CLIP) ×1 IMPLANT
COVER PROBE W GEL 5X96 (DRAPES) ×1 IMPLANT
COVER TRANSDUCER ULTRASND GEL (DISPOSABLE) ×1 IMPLANT
DERMABOND ADVANCED .7 DNX12 (GAUZE/BANDAGES/DRESSINGS) ×1 IMPLANT
DRAIN CHANNEL 15F RND FF W/TCR (WOUND CARE) IMPLANT
ELECTRODE REM PT RTRN 9FT ADLT (ELECTROSURGICAL) ×1 IMPLANT
EVACUATOR SILICONE 100CC (DRAIN) IMPLANT
GLOVE BIOGEL PI IND STRL 8 (GLOVE) ×1 IMPLANT
GOWN STRL REUS W/ TWL LRG LVL3 (GOWN DISPOSABLE) ×2 IMPLANT
GOWN STRL REUS W/TWL 2XL LVL3 (GOWN DISPOSABLE) ×2 IMPLANT
HEMOSTAT SNOW SURGICEL 2X4 (HEMOSTASIS) IMPLANT
KIT BASIN OR (CUSTOM PROCEDURE TRAY) ×1 IMPLANT
KIT SHUNT ARGYLE CAROTID ART 6 (VASCULAR PRODUCTS) IMPLANT
KIT TURNOVER KIT B (KITS) ×1 IMPLANT
NDL HYPO 25GX1X1/2 BEV (NEEDLE) IMPLANT
NDL SPNL 20GX3.5 QUINCKE YW (NEEDLE) IMPLANT
NEEDLE HYPO 25GX1X1/2 BEV (NEEDLE) ×1 IMPLANT
NEEDLE SPNL 20GX3.5 QUINCKE YW (NEEDLE) IMPLANT
NS IRRIG 1000ML POUR BTL (IV SOLUTION) ×3 IMPLANT
PACK CAROTID (CUSTOM PROCEDURE TRAY) ×1 IMPLANT
PAD ARMBOARD POSITIONER FOAM (MISCELLANEOUS) ×2 IMPLANT
PATCH VASC XENOSURE 1X6 (Vascular Products) IMPLANT
POSITIONER HEAD DONUT 9IN (MISCELLANEOUS) ×1 IMPLANT
SET WALTER ACTIVATION W/DRAPE (SET/KITS/TRAYS/PACK) ×1 IMPLANT
SHUNT CAROTID BYPASS 10 (VASCULAR PRODUCTS) IMPLANT
SHUNT CAROTID BYPASS 12FRX15.5 (VASCULAR PRODUCTS) IMPLANT
SPONGE SURGIFOAM ABS GEL 100 (HEMOSTASIS) IMPLANT
STOPCOCK 4 WAY LG BORE MALE ST (IV SETS) IMPLANT
SURGIFLO W/THROMBIN 8M KIT (HEMOSTASIS) IMPLANT
SUT ETHILON 3 0 PS 1 (SUTURE) IMPLANT
SUT MNCRL AB 4-0 PS2 18 (SUTURE) ×1 IMPLANT
SUT PROLENE 6 0 BV (SUTURE) ×1 IMPLANT
SUT PROLENE 7 0 BV1 MDA (SUTURE) IMPLANT
SUT SILK 3-0 18XBRD TIE 12 (SUTURE) IMPLANT
SUT VIC AB 2-0 CT1 TAPERPNT 27 (SUTURE) ×1 IMPLANT
SUT VIC AB 3-0 SH 27X BRD (SUTURE) ×1 IMPLANT
SYR 20CC LL (SYRINGE) ×2 IMPLANT
SYR CONTROL 10ML LL (SYRINGE) IMPLANT
TOWEL GREEN STERILE (TOWEL DISPOSABLE) ×1 IMPLANT
VASCULAR TIE MINI RED 18IN STL (MISCELLANEOUS) IMPLANT
WATER STERILE IRR 1000ML POUR (IV SOLUTION) ×1 IMPLANT

## 2024-01-25 NOTE — Transfer of Care (Signed)
 Immediate Anesthesia Transfer of Care Note  Patient: [name]  Procedure(s) Performed: ENDARTERECTOMY, CAROTID; WITH PATCH ANGIOPLASTY (Right: Neck)  Patient Location: PACU  Anesthesia Type:General  Level of Consciousness: awake, alert , and oriented  Airway & Oxygen Therapy: Patient Spontanous Breathing and Patient connected to face mask oxygen  Post-op Assessment: Report given to RN and Post -op Vital signs reviewed and stable  Post vital signs: Reviewed and stable  Last Vitals:  Vitals Value Taken Time  BP 129/76 01/25/24 1030  Temp    Pulse 54 01/25/24 1031  Resp 18 01/25/24 1031  SpO2 97 % 01/25/24 1031  Vitals shown include unfiled device data.  Last Pain:  Vitals:   01/25/24 0603  TempSrc:   PainSc: 0-No pain         Complications: No notable events documented.

## 2024-01-25 NOTE — Anesthesia Procedure Notes (Signed)
 Arterial Line Insertion Start/End5/02/2024 7:00 AM, 01/25/2024 7:10 AM Performed by: Leandro Proffer, CRNA, CRNA  Patient location: Pre-op. Preanesthetic checklist: patient identified, IV checked, site marked, risks and benefits discussed, surgical consent, monitors and equipment checked, pre-op evaluation, timeout performed and anesthesia consent Lidocaine  1% used for infiltration radial was placed Catheter size: 20 G Hand hygiene performed  and maximum sterile barriers used   Attempts: 1 Procedure performed without using ultrasound guided technique. Following insertion, dressing applied. Post procedure assessment: normal and unchanged  Patient tolerated the procedure well with no immediate complications.

## 2024-01-25 NOTE — Op Note (Signed)
 NAME: [name]    MRN: [id number] DOB: [birth year]    DATE OF OPERATION: 01/25/2024  PREOP DIAGNOSIS:    Asymptomatic right-sided critical ICA stenosis  POSTOP DIAGNOSIS:    Same  PROCEDURE:    Right carotid endarterectomy with bovine pericardial patch plasty  SURGEON: Kayla Part  ASSIST: Wynonia Hedges, PA  ANESTHESIA: General  EBL: 100 mL  INDICATIONS:    [name] is a 58 y.o. male with history of CABG and bilateral carotid artery stenosis, right greater than left.  At his last visit, right-sided carotid artery stenosis greater than 80%.  Have discussed the risks and benefits of right sided carotid endarterectomy, [name] elected to proceed.  FINDINGS:   Greater than 80% stenosis of the right carotid artery with soft, ulcerated lesion  TECHNIQUE:    DESCRIPTION: After full informed written consent was obtained from the patient, the patient was brought back to the operating room and placed supine upon the operating table.  Prior to induction, the patient received IV antibiotics.  After obtaining adequate anesthesia, the patient was placed into semi-Fowler position with a shoulder roll in place and the patient's neck slightly hyperextended and rotated away from the surgical site.  The patient was prepped in the standard fashion for a right carotid endarterectomy.  I made an incision anterior to the sternocleidomastoid muscle and dissected down through the subcutaneous tissue.  The platysmas was opened with electrocautery.  Then I dissected down to the internal jugular vein.  This was dissected posteriorly until I obtained visualization of the common carotid artery.  This was dissected out and then an umbilical tape was placed around the common carotid artery and I loosely applied a Rumel tourniquet.  I then dissected in a periadventitial fashion along the common carotid artery up to the bifurcation.  I then identified the external carotid artery and the superior  thyroid artery.  A 2-0 silk tie was looped around the superior thyroid artery, and I also dissected out the external carotid artery and placed a vessel loop around it.  In continuing the dissection to the internal carotid artery, I identified the facial vein.  This was ligated and then transected, giving me improved exposure of the internal carotid artery.  In the process of this dissection, the hypoglossal nerve was identified.  I then dissected out the internal carotid artery until I identified an area of soft tissue in the internal carotid artery.  I dissected slightly distal to this area, and placed an umbilical tape around the artery and loosely applied a Rumel tourniquet.  At this point, we gave the patient a therapeutic bolus of Heparin  intravenously (roughly 100 units/kg).  After waiting 3 minutes and confirming ACT greater than 250, I clamped the internal carotid artery, external carotid artery and then the common carotid artery.  I then made an arteriotomy in the common carotid artery with a 11 blade, and extended the arteriotomy with a Potts scissor down into the common carotid artery, then I carried the arteriotomy through the bifurcation into the internal carotid artery until I reached an area that was not diseased.  At this point, I took the 10 shunt that previously been prepared and I inserted it into the internal carotid artery.  The Rumel tourniquet was then applied to this end of the shunt.  I unclamped the shunt to verify retrograde blood flow in the internal carotid artery.  I then placed the other end of the shunt into the common  carotid artery after unclamping the artery.  The Rumel was tightened down around the shunt.  At this point, I verified blood flow in the shunt with a continuous doppler.  At this point, I started the endarterectomy in the common carotid artery with a Cytogeneticist and carried this dissection down into the common carotid artery circumferentially.  Then I transected  the plaque at a segment where it was adherent.  I then carried this dissection up into the external carotid artery.  The plaque was extracted by unclamping the external carotid artery and everting the artery.  The dissection was then carried into the internal carotid artery, extracting the remaining portion of the carotid plaque.  I passed the plaque off the field as a specimen.  I then spent the next 30 minutes removing intimal flaps and loose debris.  Eventually I reached the point where the residual plaque was densely adherent and any further dissection would compromise the integrity of the wall.  After verifying that there was no more loose intimal flaps or debris, I re-interrogated the entirety of this carotid artery.  At this point, I was satisfied that the minimal remaining disease was densely adherent to the wall and wall integrity was intact.  At this point, I then fashioned a bovine pericardial patch for the geometry of this artery and sewed it in place with two running stitch of 6-0 Prolene, one from each end.  Prior to completing this patch angioplasty, I removed the shunt first from the internal carotid artery, from which there was excellent backbleeding, and clamped it.  Then I removed the shunt from the common carotid artery, from which there was excellent antegrade bleeding, and then clamped it.  At this point, I allowed the external carotid artery to backbleed, which was excellent.  Then I instilled heparinized saline in this patched artery and then completed the patch angioplasty in the usual fashion.  First, I released the clamp on the external carotid artery, then I released it on the common carotid artery.  After waiting a few seconds, I then released it on the internal carotid artery.  I then interrogated this patient's arteries with the continuous Doppler.  The audible waveforms in each artery were consistent with the expected characteristics for each artery.  The Sonosite probe was then  sterilely draped and used to interrogate the carotid artery in both longitudinal and transverse views.  At this point, I washed out the wound, and placed thrombin and Gelfoam throughout.  I also gave the patient 30 mg of protamine  to reverse his anticoagulation.   After waiting a few minutes, I removed the thrombin and Gelfoam and washed out the wound.  There was no more active bleeding in the surgical site.   I then reapproximated the platysma muscle with a running stitch of 3-0 Vicryl.  The skin was then reapproximated with a running subcuticular 4-0 Monocryl stitch.  The skin was then cleaned, dried and Dermabond was used to reinforce the skin closure.  The patient woke without any problems, neurologically intact.   01/25/2024, 10:23 AM  Kayla Part, MD [facility] DATE OF DICTATION:   01/25/2024

## 2024-01-25 NOTE — Progress Notes (Signed)
  Progress Note    01/25/2024 1:55 PM * Day of Surgery *  Subjective:  seen in PACU, says he is feeling well. Minimal neck soreness, throats a little sore   Vitals:   01/25/24 1300 01/25/24 1315  BP: 124/67   Pulse: (!) 50 (!) 50  Resp: 11 (!) 0  Temp: 98 F (36.7 C)   SpO2: 95% 93%   Physical Exam: Cardiac:  regular Lungs:  non labored Incisions:  right neck incision is clean, dry and intact without swelling or hematoma Extremities:  moving all extremities without deficits Neurologic: alert and oriented, speech coherent, tongue midline, smile symmetric  CBC    Component Value Date/Time   WBC 9.9 01/25/2024 1048   RBC 4.85 01/25/2024 1048   HGB 14.2 01/25/2024 1048   HCT 42.7 01/25/2024 1048   PLT 188 01/25/2024 1048   MCV 88.0 01/25/2024 1048   MCH 29.3 01/25/2024 1048   MCHC 33.3 01/25/2024 1048   RDW 15.0 01/25/2024 1048   LYMPHSABS 1,833 05/27/2022 1005   MONOABS 0.9 11/10/2018 2028   EOSABS 40 05/27/2022 1005   BASOSABS 32 05/27/2022 1005    BMET    Component Value Date/Time   NA 142 01/21/2024 1030   K 3.9 01/21/2024 1030   CL 105 01/21/2024 1030   CO2 27 01/21/2024 1030   GLUCOSE 98 01/21/2024 1030   BUN 7 01/21/2024 1030   CREATININE 0.79 01/25/2024 1048   CREATININE 0.87 05/27/2022 1005   CALCIUM  9.2 01/21/2024 1030   GFRNONAA >60 01/25/2024 1048   GFRNONAA 100 11/08/2020 1122   GFRAA 116 11/08/2020 1122    INR    Component Value Date/Time   INR 1.1 01/21/2024 1030     Intake/Output Summary (Last 24 hours) at 01/25/2024 1355 Last data filed at 01/25/2024 1000 Gross per 24 hour  Intake 600 ml  Output 100 ml  Net 500 ml     Assessment/Plan:  58 y.o. male is s/p right CEA * Day of Surgery *   Neurologically intact Right neck incision is c/d/I without swelling or hematoma VSS To 4E when bed available  Anticipate d/c tomorrow if he continues to progress well   Deneen Finical, PA-C [facility] [phone] 01/25/2024 1:55 PM

## 2024-01-25 NOTE — Progress Notes (Signed)
Patient brought to 4E from PACU. VSS. Telemetry box applied, CCMD notified. Patient oriented to room and staff. Call bell in reach.   Hannah Crill L Zavannah Deblois, RN  

## 2024-01-25 NOTE — H&P (Signed)
 Office Note   Patient seen and examined in preop holding.  No complaints. No changes to medication history or physical exam since last seen in clinic. After discussing the risks and benefits of right CEA for asymptomatic ICA stenosis, [name] elected to proceed.   Kayla Part MD   CC:  Critical right-sided ICA stenosis  Requesting Provider:  No ref. provider found  HPI: [name] is a 58 y.o. (1966/07/20) male presenting at the request of .Whitaker, [employer], PA-C for critical right-sided ICA stenosis.  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  He has done well, and is currently in cardiopulmonary rehab.  On exam, [name] was doing well.  A native of [place], he has lived there his entire life.  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  In his free time, he enjoys fishing, and travels to the [place] is much as possible to [place].  He denies history of TIA, stroke, amaurosis.  The pt is  on a statin for cholesterol management.  The pt is  on a daily aspirin .   Other AC:  plavix  The pt is on medication for hypertension.   The pt is not diabetic.  Tobacco hx:  -  Past Medical History:  Diagnosis Date   Anxiety    CAD (coronary artery disease)    Carotid artery occlusion    Dysrhythmia    post-CABG PAF   HLD (hyperlipidemia)    Hypertension    PVC (premature ventricular contraction)    Thrombocytopenia (HCC)     Past Surgical History:  Procedure Laterality Date   COLONOSCOPY WITH PROPOFOL  N/A 12/12/2015   Procedure: COLONOSCOPY WITH PROPOFOL ;  Surgeon: Cassie Click, MD;  Location: [facility] ENDOSCOPY;  Service: Endoscopy;  Laterality: N/A;   COLONOSCOPY WITH PROPOFOL  N/A 06/23/2022   Procedure: COLONOSCOPY WITH PROPOFOL ;  Surgeon: Shane Darling, MD;  Location: [facility] ENDOSCOPY;  Service: Endoscopy;  Laterality: N/A;   CORONARY ARTERY BYPASS GRAFT N/A 11/08/2023   Procedure: CORONARY ARTERY BYPASS GRAFTING  (CABG) TIMES THREE USING LEFT INTERNAL MAMMARY ARTERY AND ENDOSCOPICALLY HARVESTED RIGHT GREATER SAPHENOUS VEIN;  Surgeon: Hilarie Lovely, MD;  Location: [facility] OR;  Service: Open Heart Surgery;  Laterality: N/A;   CORONARY STENT INTERVENTION N/A 02/24/2017   Procedure: Coronary Stent Intervention;  Surgeon: Percival Brace, MD;  Location: [facility] INVASIVE CV LAB;  Service: Cardiovascular;  Laterality: N/A;   CORONARY STENT INTERVENTION N/A 11/11/2018   Procedure: CORONARY STENT INTERVENTION;  Surgeon: Antonette Batters, MD;  Location: [facility] INVASIVE CV LAB;  Service: Cardiovascular;  Laterality: N/A;   ESOPHAGOGASTRODUODENOSCOPY (EGD) WITH PROPOFOL  N/A 06/23/2022   Procedure: ESOPHAGOGASTRODUODENOSCOPY (EGD) WITH PROPOFOL ;  Surgeon: Shane Darling, MD;  Location: [facility] ENDOSCOPY;  Service: Endoscopy;  Laterality: N/A;   LEFT HEART CATH AND CORONARY ANGIOGRAPHY Left 02/24/2017   Procedure: Left Heart Cath and Coronary Angiography;  Surgeon: Percival Brace, MD;  Location: [facility] INVASIVE CV LAB;  Service: Cardiovascular;  Laterality: Left;   LEFT HEART CATH AND CORONARY ANGIOGRAPHY N/A 11/11/2018   Procedure: LEFT HEART CATH AND CORONARY ANGIOGRAPHY with possible pci and stent;  Surgeon: Antonette Batters, MD;  Location: [facility] INVASIVE CV LAB;  Service: Cardiovascular;  Laterality: N/A;   LEFT HEART CATH AND CORONARY ANGIOGRAPHY Left 10/27/2023   Procedure: LEFT HEART CATH AND CORONARY ANGIOGRAPHY;  Surgeon: Percival Brace, MD;  Location: [facility] INVASIVE CV LAB;  Service: Cardiovascular;  Laterality: Left;  stents     TEE WITHOUT CARDIOVERSION N/A 11/08/2023   Procedure: TRANSESOPHAGEAL ECHOCARDIOGRAM (TEE);  Surgeon: Hilarie Lovely, MD;  Location: [facility] OR;  Service: Open Heart Surgery;  Laterality: N/A;    Social History   Socioeconomic History   Marital status: Married    Spouse name: [name]   Number of children: 0   Years of education: Not on file   Highest education level: Not  on file  Occupational History   Not on file  Tobacco Use   Smoking status: Former    Current packs/day: 0.00    Average packs/day: 0.3 packs/day for 25.0 years (6.3 ttl pk-yrs)    Types: Cigarettes    Start date: 09/1992    Quit date: 09/2017    Years since quitting: 6.3   Smokeless tobacco: Never  Vaping Use   Vaping status: Never Used  Substance and Sexual Activity   Alcohol use: Yes    Alcohol/week: 8.0 standard drinks of alcohol    Types: 8 Cans of beer per week    Comment: daily 2 drinks beer   Drug use: No   Sexual activity: Not Currently  Other Topics Concern   Not on file  Social History Narrative   Lives at home with wife. 2 dogs & 1 cats & 40 chickens    Social Drivers of Corporate investment banker Strain: Low Risk  (01/10/2024)   Received from [facility]   Overall Financial Resource Strain (CARDIA)    Difficulty of Paying Living Expenses: Not hard at all  Food Insecurity: Patient Unable To Answer (11/08/2023)   Hunger Vital Sign    Worried About Running Out of Food in the Last Year: Patient unable to answer    Ran Out of Food in the Last Year: Patient unable to answer  Transportation Needs: Patient Unable To Answer (11/08/2023)   PRAPARE - Transportation    Lack of Transportation (Medical): Patient unable to answer    Lack of Transportation (Non-Medical): Patient unable to answer  Physical Activity: Insufficiently Active (09/16/2017)   Received from [facility], [facility]   Exercise Vital Sign    Days of Exercise per Week: 4 days    Minutes of Exercise per Session: 30 min  Stress: Stress Concern Present (09/16/2017)   Received from [facility], [facility]   [employer] of Occupational Health - Occupational Stress Questionnaire    Feeling of Stress : Rather much  Social Connections: Somewhat Isolated (09/16/2017)   Received from [facility],  [facility]   Social Connection and Isolation Panel [NHANES]    Frequency of Communication with Friends and Family: More than three times a week    Frequency of Social Gatherings with Friends and Family: More than three times a week    Attends Religious Services: Never    Database administrator or Organizations: Not on file    Attends Banker Meetings: Never    Marital Status: Married  Catering manager Violence: Patient Unable To Answer (11/08/2023)   Humiliation, Afraid, Rape, and Kick questionnaire    Fear of Current or Ex-Partner: Patient unable to answer    Emotionally Abused: Patient unable to answer    Physically Abused: Patient unable to answer    Sexually Abused: Patient unable to answer   Family History  Problem Relation Age of Onset   CAD Mother    Hypertension Mother  Cancer Father    CAD Father     Current Facility-Administered Medications  Medication Dose Route Frequency Provider Last Rate Last Admin   0.9 %  sodium chloride  infusion   Intravenous Continuous Kayla Part, MD       ceFAZolin  (ANCEF ) IVPB 2g/100 mL premix  2 g Intravenous 30 min Pre-Op Kayla Part, MD       Chlorhexidine  Gluconate Cloth 2 % PADS 6 each  6 each Topical Once Lorie Cleckley E, MD       And   Chlorhexidine  Gluconate Cloth 2 % PADS 6 each  6 each Topical Once Davione Lenker E, MD       lactated ringers  infusion   Intravenous Continuous Arvie Latus, MD       remifentanil (ULTIVA) 2 mg in 100 mL normal saline (20 mcg/mL) Optime  0.2 mcg/kg/min Intravenous Continuous Arvie Latus, MD        Allergies  Allergen Reactions   Lopid [Gemfibrozil] Other (See Comments)    Legs hurt   Statins Other (See Comments)    Pain and cramps      REVIEW OF SYSTEMS:  [X]  denotes positive finding, [ ]  denotes negative finding Cardiac  Comments:  Chest pain or chest pressure:    Shortness of breath upon exertion:    Short of breath when lying flat:     Irregular heart rhythm:        Vascular    Pain in calf, thigh, or hip brought on by ambulation:    Pain in feet at night that wakes you up from your sleep:     Blood clot in your veins:    Leg swelling:         Pulmonary    Oxygen at home:    Productive cough:     Wheezing:         Neurologic    Sudden weakness in arms or legs:     Sudden numbness in arms or legs:     Sudden onset of difficulty speaking or slurred speech:    Temporary loss of vision in one eye:     Problems with dizziness:         Gastrointestinal    Blood in stool:     Vomited blood:         Genitourinary    Burning when urinating:     Blood in urine:        Psychiatric    Major depression:         Hematologic    Bleeding problems:    Problems with blood clotting too easily:        Skin    Rashes or ulcers:        Constitutional    Fever or chills:      PHYSICAL EXAMINATION:  Vitals:   01/25/24 0549  BP: (!) 147/81  Pulse: (!) 56  Resp: 18  Temp: (!) 97.3 F (36.3 C)  TempSrc: Oral  SpO2: 100%  Weight: 72.6 kg  Height: 5\' 9"  (1.753 m)    General:  WDWN in NAD; vital signs documented above Gait: Not observed HENT: WNL, normocephalic Pulmonary: normal non-labored breathing , without wheezing Cardiac: regular HR Abdomen: soft, NT, no masses Skin: without rashes Vascular Exam/Pulses:  Right Left  Radial 2+ (normal) 2+ (normal)  Ulnar    Femoral    Popliteal    DP 2+ (normal) 2+ (normal)  PT     Extremities: without ischemic  changes, without Gangrene , without cellulitis; without open wounds;  Musculoskeletal: no muscle wasting or atrophy  Neurologic: A&O X 3;  No focal weakness or paresthesias are detected Psychiatric:  The pt has Normal affect.   Non-Invasive Vascular Imaging:     Right Carotid Findings:  +----------+--------+--------+--------+-------------------------+--------+           PSV cm/sEDV cm/sStenosisPlaque Description       Comments   +----------+--------+--------+--------+-------------------------+--------+  CCA Prox  55      7                                                  +----------+--------+--------+--------+-------------------------+--------+  CCA Mid   50      10                                                 +----------+--------+--------+--------+-------------------------+--------+  CCA Distal50      10                                                 +----------+--------+--------+--------+-------------------------+--------+  ICA Prox  432     166     80-99%  calcific and heterogenous          +----------+--------+--------+--------+-------------------------+--------+  ICA Mid   147     40                                                 +----------+--------+--------+--------+-------------------------+--------+  ICA Distal95      11                                                 +----------+--------+--------+--------+-------------------------+--------+  ECA      216     17      >50%    heterogenous                       +----------+--------+--------+--------+-------------------------+--------+   +----------+--------+-------+----------------+-------------------+           PSV cm/sEDV cmsDescribe        Arm Pressure (mmHG)  +----------+--------+-------+----------------+-------------------+  Subclavian140    7      Multiphasic, WNL                     +----------+--------+-------+----------------+-------------------+   +---------+--------+--+--------+--+---------+  VertebralPSV cm/s89EDV cm/s19Antegrade  +---------+--------+--+--------+--+---------+     ASSESSMENT/PLAN: [name] is a 58 y.o. male presenting with asymptomatic, bilateral carotid artery stenosis.  The right side is more stenotic than the left.  Imaging and velocities demonstrate stenosis greater than 80% in the Rt ICA.  I had a long conversation with [place] regarding the  above, most notably the natural history of carotid disease.  I quoted him an 11% risk of stroke over the next 5 years with optimal medical management.  This is decreased to 5% with intervention.  Being that he is only 87, I think he would benefit from cerebrovascular revascularization in an effort to decrease the stroke rate.  I have examined his imaging that was completed on 12/28/2023.  He is not a candidate for TCAR as his bifurcation is low.  We discussed the risks and benefits of carotid endarterectomy at length.  After discussing these risks and benefits, he elected to proceed.  Due to his cardiac history, I have asked for cardiac clearance prior to scheduling. The case will be performed with aspirin , Plavix , statin on board at his convenience after cardiac clearance    Kayla Part, MD [facility] [phone]

## 2024-01-25 NOTE — Discharge Instructions (Signed)
 [facility]  Discharge Instructions   Carotid Surgery  Please refer to the following instructions for your post-procedure care. Your surgeon or physician assistant will discuss any changes with you.  Activity  You are encouraged to walk as much as you can. You can slowly return to normal activities but must avoid strenuous activity and heavy lifting until your doctor tell you it's okay. Avoid activities such as vacuuming or swinging a golf club. You can drive after one week if you are comfortable and you are no longer taking prescription pain medications. It is normal to feel tired for serval weeks after your surgery. It is also normal to have difficulty with sleep habits, eating, and bowel movements after surgery. These will go away with time.  Bathing/Showering  Shower daily after you go home. Do not soak in a bathtub, hot tub, or swim until the incision heals completely.  Incision Care  Shower every day. Clean your incision with mild soap and water . Pat the area dry with a clean towel. You do not need a bandage unless otherwise instructed. Do not apply any ointments or creams to your incision. You may have skin glue on your incision. Do not peel it off. It will come off on its own in about one week. Your incision may feel thickened and raised for several weeks after your surgery. This is normal and the skin will soften over time.   For Men Only: It's okay to shave around the incision but do not shave the incision itself for 2 weeks. It is common to have numbness under your chin that could last for several months.  Diet  Resume your normal diet. There are no special food restrictions following this procedure. A low fat/low cholesterol diet is recommended for all patients with vascular disease. In order to heal from your surgery, it is CRITICAL to get adequate nutrition. Your body requires vitamins, minerals, and protein. Vegetables are the best source of  vitamins and minerals. Vegetables also provide the perfect balance of protein. Processed food has little nutritional value, so try to avoid this.  Medications  Resume taking all of your medications unless your doctor or physician assistant tells you not to. If your incision is causing pain, you may take over-the- counter pain relievers such as acetaminophen  (Tylenol ). If you were prescribed a stronger pain medication, please be aware these medications can cause nausea and constipation. Prevent nausea by taking the medication with a snack or meal. Avoid constipation by drinking plenty of fluids and eating foods with a high amount of fiber, such as fruits, vegetables, and grains.  Do not take Tylenol  if you are taking prescription pain medications.  Follow Up  Our office will schedule a follow up appointment 2-3 weeks following discharge.  Please call us  immediately for any of the following conditions  Increased pain, redness, drainage (pus) from your incision site. Fever of 101 degrees or higher. If you should develop stroke (slurred speech, difficulty swallowing, weakness on one side of your body, loss of vision) you should call 911 and go to the nearest emergency room.  Reduce your risk of vascular disease:  Stop smoking. If you would like help call [facility] at 1-800-QUIT-NOW (334-470-1569) or [place] at [phone]. Manage your cholesterol Maintain a desired weight Control your diabetes Keep your blood pressure down  If you have any questions, please call the office at [phone].  ==============================================================  Patient agrees with increasing pravastatin  to 40mg . If he tolerates  this, he will discuss switching to Crestor with Cardiologist next month.

## 2024-01-25 NOTE — Anesthesia Procedure Notes (Signed)
 Procedure Name: Intubation Date/Time: 01/25/2024 7:58 AM  Performed by: Leandro Proffer, CRNAPre-anesthesia Checklist: Patient identified, Emergency Drugs available, Suction available and Patient being monitored Patient Re-evaluated:Patient Re-evaluated prior to induction Oxygen Delivery Method: Circle system utilized Preoxygenation: Pre-oxygenation with 100% oxygen Induction Type: IV induction Ventilation: Mask ventilation without difficulty Laryngoscope Size: Mac and 4 Grade View: Grade II Tube type: Oral Tube size: 7.0 mm Number of attempts: 1 Airway Equipment and Method: Stylet and Oral airway Placement Confirmation: ETT inserted through vocal cords under direct vision, positive ETCO2 and breath sounds checked- equal and bilateral Secured at: 22 cm Tube secured with: Tape Dental Injury: Teeth and Oropharynx as per pre-operative assessment

## 2024-01-26 ENCOUNTER — Encounter: Payer: Self-pay | Admitting: *Deleted

## 2024-01-26 ENCOUNTER — Encounter (HOSPITAL_COMMUNITY): Payer: Self-pay | Admitting: Vascular Surgery

## 2024-01-26 ENCOUNTER — Other Ambulatory Visit (HOSPITAL_COMMUNITY): Payer: Self-pay

## 2024-01-26 DIAGNOSIS — Z951 Presence of aortocoronary bypass graft: Secondary | ICD-10-CM

## 2024-01-26 LAB — CBC
HCT: 39.4 % (ref 39.0–52.0)
Hemoglobin: 13.2 g/dL (ref 13.0–17.0)
MCH: 28.9 pg (ref 26.0–34.0)
MCHC: 33.5 g/dL (ref 30.0–36.0)
MCV: 86.4 fL (ref 80.0–100.0)
Platelets: 181 10*3/uL (ref 150–400)
RBC: 4.56 MIL/uL (ref 4.22–5.81)
RDW: 14.8 % (ref 11.5–15.5)
WBC: 9.6 10*3/uL (ref 4.0–10.5)
nRBC: 0 % (ref 0.0–0.2)

## 2024-01-26 LAB — BASIC METABOLIC PANEL WITH GFR
Anion gap: 8 (ref 5–15)
BUN: 9 mg/dL (ref 6–20)
CO2: 21 mmol/L — ABNORMAL LOW (ref 22–32)
Calcium: 8.4 mg/dL — ABNORMAL LOW (ref 8.9–10.3)
Chloride: 108 mmol/L (ref 98–111)
Creatinine, Ser: 0.82 mg/dL (ref 0.61–1.24)
GFR, Estimated: >60 mL/min (ref >60)
Glucose, Bld: 146 mg/dL — ABNORMAL HIGH (ref 70–99)
Potassium: 3.8 mmol/L (ref 3.5–5.1)
Sodium: 137 mmol/L (ref 135–145)

## 2024-01-26 LAB — LIPID PANEL
Cholesterol: 182 mg/dL (ref 0–200)
HDL: 49 mg/dL (ref >40)
LDL Cholesterol: 120 mg/dL — ABNORMAL HIGH (ref 0–99)
Total CHOL/HDL Ratio: 3.7 ratio
Triglycerides: 64 mg/dL (ref <150)
VLDL: 13 mg/dL (ref 0–40)

## 2024-01-26 MED ORDER — OXYCODONE-ACETAMINOPHEN 5-325 MG PO TABS: 1.0000 | ORAL_TABLET | Freq: Four times a day (QID) | ORAL | 0 refills | Status: DC | PRN

## 2024-01-26 MED ORDER — PRAVASTATIN SODIUM 40 MG PO TABS
40.0000 mg | ORAL_TABLET | Freq: Every day | ORAL | 2 refills | Status: AC
  Filled 2024-01-26: qty 30, 30d supply, fill #0

## 2024-01-26 MED ORDER — PRAVASTATIN SODIUM 40 MG PO TABS: 40.0000 mg | ORAL_TABLET | Freq: Every day | ORAL | Status: DC

## 2024-01-26 NOTE — Progress Notes (Signed)
 PHARMACIST LIPID MONITORING   [name] is a 58 y.o. male admitted on 01/25/2024 with Asymptomatic right-sided critical ICA stenosis  s/p TCAR.  Pharmacy has been consulted to optimize lipid-lowering therapy with the indication of secondary prevention for clinical ASCVD.  Recent Labs:  Lipid Panel (last 6 months):   Lab Results  Component Value Date   CHOL 182 01/26/2024   TRIG 64 01/26/2024   HDL 49 01/26/2024   CHOLHDL 3.7 01/26/2024   VLDL 13 01/26/2024   LDLCALC 120 (H) 01/26/2024    Hepatic function panel (last 6 months):   Lab Results  Component Value Date   AST 20 01/21/2024   ALT 18 01/21/2024   ALKPHOS 98 01/21/2024   BILITOT 1.7 (H) 01/21/2024    SCr (since admission):   Serum creatinine: 0.82 mg/dL 16/10/96 0454 Estimated creatinine clearance: 95.8 mL/min  Current therapy and lipid therapy tolerance Current lipid-lowering therapy: pravastatin   Previous lipid-lowering therapies (if applicable): atorvastatin, gemfibrozil Documented or reported allergies or intolerances to lipid-lowering therapies (if applicable): felt like "hit by a truck" on atorvastatin and gemfibrozil  Assessment:   Discussed several options given hx of intolerance. He has discussed IV medications with cardiologist and is not ready to try those yet.  Patient agrees with changes to lipid-lowering therapy by increasing pravastatin  to 40mg . If he tolerates this, he will discuss switching to Crestor with Cardiologist next month.   Plan:    1.Statin intensity (high intensity recommended for all patients regardless of the LDL):  Statin intolerance noted. Discussed with patient, increase statin therapy to pravastatin  40mg .  2.Add ezetimibe (if any one of the following):   Not indicated at this time.  3.Refer to lipid clinic:   No  4.Follow-up with:  Cardiology provider - Dr. Scarlet Curly at [facility]   5.Follow-up labs after discharge:  Changes in lipid therapy were made. Check a lipid panel in  8-12 weeks then annually.     Dorene Gang, PharmD, BCPS, BCCP Clinical Pharmacist  Please check AMION for all [facility] phone numbers After 10:00 PM, call Main Pharmacy [phone]

## 2024-01-26 NOTE — Progress Notes (Signed)
 Discharge instructions reviewed with pt.  Copy of instructions given to pt. Pt informed scripts were sent to his pharmacy for pick up.  Pt d/c'd via wheelchair with belongings and was escorted by staff.   Thelton Graca,RN SWOT

## 2024-01-26 NOTE — Plan of Care (Signed)
  Problem: Activity: Goal: Risk for activity intolerance will decrease Outcome: Progressing   Problem: Pain Managment: Goal: General experience of comfort will improve and/or be controlled Outcome: Progressing   Problem: Safety: Goal: Ability to remain free from injury will improve Outcome: Progressing   Problem: Skin Integrity: Goal: Risk for impaired skin integrity will decrease Outcome: Progressing

## 2024-01-26 NOTE — Discharge Summary (Signed)
 Discharge Summary     [name] 1966-07-16 58 y.o. male  [id number]  Admission Date: 01/25/2024  Discharge Date: 01/26/2024  Physician: Kayla Part, MD  Admission Diagnosis: Bilateral carotid artery occlusion [I65.23] Carotid stenosis [I65.29] Carotid stenosis, asymptomatic, right [I65.21]   HPI:   This is a 58 y.o. male ***  Hospital Course:  The patient was admitted to the hospital and taken to the operating room on 01/25/2024 and underwent ***    Findings: ***  The pt tolerated the procedure well and was transported to the PACU in {DESC; GOOD/FAIR/POOR:18685} condition.   By POD 1, the pt neuro status ***   Recent Labs    01/26/24 0400  NA 137  K 3.8  CL 108  CO2 21*  GLUCOSE 146*  BUN 9  CALCIUM  8.4*   Recent Labs    01/25/24 1048 01/26/24 0400  WBC 9.9 9.6  HGB 14.2 13.2  HCT 42.7 39.4  PLT 188 181   No results for input(s): "INR" in the last 72 hours.   Discharge Instructions     Discharge patient   Complete by: As directed    Discharge home once pt has med from [facility]   Discharge disposition: 01-Home or Self Care   Discharge patient date: 01/26/2024       Discharge Diagnosis:  Bilateral carotid artery occlusion [I65.23] Carotid stenosis [I65.29] Carotid stenosis, asymptomatic, right [I65.21]  Secondary Diagnosis: Patient Active Problem List   Diagnosis Date Noted   Carotid stenosis 01/25/2024   Carotid stenosis, asymptomatic, right 01/25/2024   New onset atrial fibrillation (HCC) 11/10/2023   ABLA (acute blood loss anemia) 11/10/2023   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   Coronary artery disease 11/08/2023   Loss of appetite for more than 2 weeks 06/18/2022   Weight loss 06/18/2022   Moderate mixed hyperlipidemia not requiring statin therapy 05/19/2022   Bilateral carotid bruits 11/08/2020   Ischemic chest pain (HCC) 11/10/2018   Primary hypertension 11/10/2018   HLD (hyperlipidemia) 11/10/2018   Anxiety 11/10/2018   Chest pain  11/10/2018   S/P drug eluting coronary stent placement 06/14/2017   Thrombocytopenia (HCC) 03/22/2017   Coronary artery disease of native artery of native heart with stable angina pectoris (HCC) 02/24/2017   Polycythemia 02/26/2016   Migraine headache with aura 01/01/2016   Primary insomnia 02/12/2015   PVC (premature ventricular contraction) 01/23/2015   Past Medical History:  Diagnosis Date   Anxiety    CAD (coronary artery disease)    Carotid artery occlusion    Dysrhythmia    post-CABG PAF   HLD (hyperlipidemia)    Hypertension    PVC (premature ventricular contraction)    Thrombocytopenia (HCC)     Allergies as of 01/26/2024       Reactions   Atorvastatin Other (See Comments)   Pain and cramps. Like "hit by a truck"    Lopid [gemfibrozil] Other (See Comments)   Legs hurt        Medication List     PAUSE taking these medications    metoprolol  succinate 100 MG 24 hr tablet Wait to take this until: Jan 28, 2024 Commonly known as: TOPROL -XL Take 1 tablet (100 mg total) by mouth daily. Take with or immediately following a meal.       TAKE these medications    amiodarone  200 MG tablet Commonly known as: PACERONE  Take 2 tablets (400mg ) twice per day for 7 days, then take 1 tablet (200mg ) twice per day for 7  days, then take 1 tablet (200mg ) daily thereafter   amLODipine  10 MG tablet Commonly known as: NORVASC  Take 10 mg by mouth daily.   aspirin  81 MG tablet Take 81 mg by mouth daily.   clopidogrel  75 MG tablet Commonly known as: PLAVIX  Take 1 tablet (75 mg total) by mouth daily.   Coenzyme Q10 100 MG Tabs Take 100 mg by mouth daily.   FISH OIL PO Take 690 mg by mouth daily.   furosemide  40 MG tablet Commonly known as: LASIX  Take 1 tablet (40 mg total) by mouth daily.   loratadine  10 MG tablet Commonly known as: CLARITIN  Take 1 tablet (10 mg total) by mouth daily.   oxyCODONE -acetaminophen  5-325 MG tablet Commonly known as: Percocet Take 1  tablet by mouth every 6 (six) hours as needed for severe pain (pain score 7-10).   pravastatin  40 MG tablet Commonly known as: PRAVACHOL  Take 1 tablet (40 mg total) by mouth at bedtime. What changed:  medication strength how much to take         [facility] Discharge Instructions Carotid Endarterectomy (CEA)  Please refer to the following instructions for your post-procedure care. Your surgeon or physician assistant will discuss any changes with you.  Activity  You are encouraged to walk as much as you can. You can slowly return to normal activities but must avoid strenuous activity and heavy lifting until your doctor tell you it's OK. Avoid activities such as vacuuming or swinging a golf club. You can drive after one week if you are comfortable and you are no longer taking prescription pain medications. It is normal to feel tired for serval weeks after your surgery. It is also normal to have difficulty with sleep habits, eating, and bowel movements after surgery. These will go away with time.  Bathing/Showering  You may shower after you come home. Do not soak in a bathtub, hot tub, or swim until the incision heals completely.  Incision Care  Shower every day. Clean your incision with mild soap and water . Pat the area dry with a clean towel. You do not need a bandage unless otherwise instructed. Do not apply any ointments or creams to your incision. You may have skin glue on your incision. Do not peel it off. It will come off on its own in about one week. Your incision may feel thickened and raised for several weeks after your surgery. This is normal and the skin will soften over time. For Men Only: It's OK to shave around the incision but do not shave the incision itself for 2 weeks. It is common to have numbness under your chin that could last for several months.  Diet  Resume your normal diet. There are no special food restrictions following this  procedure. A low fat/low cholesterol diet is recommended for all patients with vascular disease. In order to heal from your surgery, it is CRITICAL to get adequate nutrition. Your body requires vitamins, minerals, and protein. Vegetables are the best source of vitamins and minerals. Vegetables also provide the perfect balance of protein. Processed food has little nutritional value, so try to avoid this.  Medications  Resume taking all of your medications unless your doctor or physician assistant tells you not to.  If your incision is causing pain, you may take over-the- counter pain relievers such as acetaminophen  (Tylenol ). If you were prescribed a stronger pain medication, please be aware these medications can cause nausea and constipation.  Prevent nausea by  taking the medication with a snack or meal. Avoid constipation by drinking plenty of fluids and eating foods with a high amount of fiber, such as fruits, vegetables, and grains.  Do not take Tylenol  if you are taking prescription pain medications.  Follow Up  Our office will schedule a follow up appointment 2-3 weeks following discharge.  Please call us  immediately for any of the following conditions  Increased pain, redness, drainage (pus) from your incision site. Fever of 101 degrees or higher. If you should develop stroke (slurred speech, difficulty swallowing, weakness on one side of your body, loss of vision) you should call 911 and go to the nearest emergency room.  Reduce your risk of vascular disease:  Stop smoking. If you would like help call [facility] at 1-800-QUIT-NOW (260-626-0956) or  at [phone]. Manage your cholesterol Maintain a desired weight Control your diabetes Keep your blood pressure down  If you have any questions, please call the office at [phone].  Prescriptions given: 1.   Roxicet #8 No Refill (sent to [facility]) 2.  Pravachol  40mg  #30 two refills. (Sent to  [facility])  Disposition: home  Patient's condition: is Good  Follow up: Dr. Rosalva Comber in 2-3 weeks for further discussions of left CEA and incision check   Maryanna Smart, PA-C [facility] [phone]   --- For [facility] Registry use ---   Modified Rankin score at D/C (0-6): 0  IV medication needed for:  1. Hypertension: No 2. Hypotension: No  Post-op Complications: No  1. Post-op CVA or TIA: No  If yes: Event classification (right eye, left eye, right cortical, left cortical, verterobasilar, other): n/a  If yes: Timing of event (intra-op, <6 hrs post-op, >=6 hrs post-op, unknown): n/a  2. CN injury: No  If yes: CN n/a injuried   3. Myocardial infarction: No  If yes: Dx by (EKG or clinical, Troponin): n/a  4.  CHF: No  5.  Dysrhythmia (new): No  6. Wound infection: No  7. Reperfusion symptoms: No  8. Return to OR: No  If yes: return to OR for (bleeding, neurologic, other CEA incision, other): n/a  Discharge medications: Statin use:  Yes ASA use:  Yes   Beta blocker use:  Yes ACE-Inhibitor use:  No  ARB use:  No CCB use: Yes P2Y12 Antagonist use: Yes, [ ]  Plavix , [ ]  Plasugrel, [ ]  Ticlopinine, [ ]  Ticagrelor, [ ]  Other, [ ]  No for medical reason, [ ]  Non-compliant, [ ]  Not-indicated Anti-coagulant use:  No, [ ]  Warfarin, [ ]  Rivaroxaban, [ ]  Dabigatran,

## 2024-01-26 NOTE — Progress Notes (Signed)
 Cardiac Individual Treatment Plan  Patient Details  Name: [name] MRN: [id number] Date of Birth: [birth year] Referring Provider:   Flowsheet Row Cardiac Rehab from 12/29/2023 in [facility] Cardiac and Pulmonary Rehab  Referring Provider Dr. Percival Brace, MD       Initial Encounter Date:  Flowsheet Row Cardiac Rehab from 12/29/2023 in [facility] Cardiac and Pulmonary Rehab  Date 12/29/23       Visit Diagnosis: S/P CABG x 3  Patient's Home Medications on Admission:  Current Outpatient Medications:    amiodarone  (PACERONE ) 200 MG tablet, Take 2 tablets (400mg ) twice per day for 7 days, then take 1 tablet (200mg ) twice per day for 7 days, then take 1 tablet (200mg ) daily thereafter (Patient not taking: Reported on 01/06/2024), Disp: 60 tablet, Rfl: 1   amLODipine  (NORVASC ) 10 MG tablet, Take 10 mg by mouth daily., Disp: , Rfl:    aspirin  81 MG tablet, Take 81 mg by mouth daily. , Disp: , Rfl:    clopidogrel  (PLAVIX ) 75 MG tablet, Take 1 tablet (75 mg total) by mouth daily., Disp: 90 tablet, Rfl: 1   Coenzyme Q10 100 MG TABS, Take 100 mg by mouth daily., Disp: , Rfl:    furosemide  (LASIX ) 40 MG tablet, Take 1 tablet (40 mg total) by mouth daily. (Patient not taking: Reported on 12/23/2023), Disp: 7 tablet, Rfl: 0   loratadine  (CLARITIN ) 10 MG tablet, Take 1 tablet (10 mg total) by mouth daily., Disp: 90 tablet, Rfl: 1   [Paused] metoprolol  succinate (TOPROL -XL) 100 MG 24 hr tablet, Take 1 tablet (100 mg total) by mouth daily. Take with or immediately following a meal., Disp: 30 tablet, Rfl: 1   Omega-3 Fatty Acids (FISH OIL PO), Take 690 mg by mouth daily., Disp: , Rfl:    oxyCODONE -acetaminophen  (PERCOCET) 5-325 MG tablet, Take 1 tablet by mouth every 6 (six) hours as needed for severe pain (pain score 7-10)., Disp: 8 tablet, Rfl: 0   pravastatin  (PRAVACHOL ) 40 MG tablet, Take 1 tablet (40 mg total) by mouth at bedtime., Disp: 30 tablet, Rfl: 2 No current facility-administered medications for  this visit.  Facility-Administered Medications Ordered in Other Visits:    0.9 %  sodium chloride  infusion, 500 mL, Intravenous, Once PRN, Baglia, Corrina, PA-C   0.9 %  sodium chloride  infusion, 250 mL, Intravenous, PRN, Baglia, Corrina, PA-C   acetaminophen  (TYLENOL ) tablet 325-650 mg, 325-650 mg, Oral, Q4H PRN, 650 mg at 01/25/24 1943 **OR** acetaminophen  (TYLENOL ) suppository 325-650 mg, 325-650 mg, Rectal, Q4H PRN, Baglia, Corrina, PA-C   alum & mag hydroxide-simeth (MAALOX/MYLANTA) 200-200-20 MG/5ML suspension 15-30 mL, 15-30 mL, Oral, Q2H PRN, Baglia, Corrina, PA-C   amLODipine  (NORVASC ) tablet 10 mg, 10 mg, Oral, Daily, Baglia, Corrina, PA-C, 10 mg at 01/26/24 0948   aspirin  EC tablet 81 mg, 81 mg, Oral, Daily, Baglia, Corrina, PA-C, 81 mg at 01/26/24 0948   bisacodyl  (DULCOLAX) EC tablet 5 mg, 5 mg, Oral, Daily PRN, Baglia, Corrina, PA-C   clopidogrel  (PLAVIX ) tablet 75 mg, 75 mg, Oral, Daily, Baglia, Corrina, PA-C, 75 mg at 01/26/24 0948   docusate sodium  (COLACE) capsule 100 mg, 100 mg, Oral, Daily, Baglia, Corrina, PA-C, 100 mg at 01/26/24 0948   guaiFENesin-dextromethorphan (ROBITUSSIN DM) 100-10 MG/5ML syrup 15 mL, 15 mL, Oral, Q4H PRN, Baglia, Corrina, PA-C   heparin  injection 5,000 Units, 5,000 Units, Subcutaneous, Q8H, Baglia, Corrina, PA-C, 5,000 Units at 01/26/24 0620   hydrALAZINE  (APRESOLINE ) injection 5 mg, 5 mg, Intravenous, Q20 Min PRN, Baglia, Corrina, PA-C  HYDROmorphone (DILAUDID) injection 0.5-1 mg, 0.5-1 mg, Intravenous, Q2H PRN, Baglia, Corrina, PA-C   labetalol  (NORMODYNE ) injection 10 mg, 10 mg, Intravenous, Q10 min PRN, Baglia, Corrina, PA-C   loratadine  (CLARITIN ) tablet 10 mg, 10 mg, Oral, Daily, Baglia, Corrina, PA-C, 10 mg at 01/26/24 1610   magnesium  sulfate IVPB 2 g 50 mL, 2 g, Intravenous, Daily PRN, Baglia, Corrina, PA-C   metoprolol  succinate (TOPROL -XL) 24 hr tablet 100 mg, 100 mg, Oral, Daily, Baglia, Corrina, PA-C   metoprolol  tartrate (LOPRESSOR )  injection 2-5 mg, 2-5 mg, Intravenous, Q2H PRN, Baglia, Corrina, PA-C   ondansetron  (ZOFRAN ) injection 4 mg, 4 mg, Intravenous, Q6H PRN, Baglia, Corrina, PA-C   Oral care mouth rinse, 15 mL, Mouth Rinse, PRN, Kayla Part, MD   oxyCODONE  (Oxy IR/ROXICODONE ) immediate release tablet 5-10 mg, 5-10 mg, Oral, Q4H PRN, Baglia, Corrina, PA-C   pantoprazole  (PROTONIX ) EC tablet 40 mg, 40 mg, Oral, Daily, Baglia, Corrina, PA-C, 40 mg at 01/26/24 0948   phenol (CHLORASEPTIC) mouth spray 1 spray, 1 spray, Mouth/Throat, PRN, Baglia, Corrina, PA-C   potassium chloride  SA (KLOR-CON  M) CR tablet 20-40 mEq, 20-40 mEq, Oral, Daily PRN, Baglia, Corrina, PA-C   pravastatin  (PRAVACHOL ) tablet 40 mg, 40 mg, Oral, QHS, Chen, Lydia D, RPH   senna-docusate (Senokot-S) tablet 1 tablet, 1 tablet, Oral, QHS PRN, Baglia, Corrina, PA-C   sodium chloride  flush (NS) 0.9 % injection 3 mL, 3 mL, Intravenous, Q12H, Baglia, Corrina, PA-C, 3 mL at 01/26/24 9604   sodium chloride  flush (NS) 0.9 % injection 3 mL, 3 mL, Intravenous, PRN, Baglia, Corrina, PA-C   sodium phosphate (FLEET) enema 1 enema, 1 enema, Rectal, Once PRN, Tacy Expose, Corrina, PA-C  Past Medical History: Past Medical History:  Diagnosis Date   Anxiety    CAD (coronary artery disease)    Carotid artery occlusion    Dysrhythmia    post-CABG PAF   HLD (hyperlipidemia)    Hypertension    PVC (premature ventricular contraction)    Thrombocytopenia (HCC)     Tobacco Use: Social History   Tobacco Use  Smoking Status Former   Current packs/day: 0.00   Average packs/day: 0.3 packs/day for 25.0 years (6.3 ttl pk-yrs)   Types: Cigarettes   Start date: 09/1992   Quit date: 09/2017   Years since quitting: 6.3  Smokeless Tobacco Never    Labs: Review Flowsheet  More data may exist      Latest Ref Rng & Units 11/08/2020 05/27/2022 11/04/2023 11/08/2023 01/26/2024  Labs for ITP Cardiac and Pulmonary Rehab  Cholestrol 0 - 200 mg/dL 540  981  - - 191   LDL  (calc) 0 - 99 mg/dL --  478  - - 295   HDL-C >40 mg/dL 36  43  - - 49   Trlycerides <150 mg/dL 621  308  - - 64   Hemoglobin A1c 4.8 - 5.6 % - - 4.7  - -  PH, Arterial 7.35 - 7.45 - - - 7.314  7.316  7.425  7.367  7.385  7.358  7.401  -  PCO2 arterial 32 - 48 mmHg - - - 42.8  42.1  27.6  44.8  39.9  38.3  36.4  -  Bicarbonate 20.0 - 28.0 mmol/L - - - 21.6  21.4  18.4  25.7  23.9  22.1  21.6  22.6  -  TCO2 22 - 32 mmol/L - - - 23  23  19  24  27  25  25   23  23  24  22  24   -  Acid-base deficit 0.0 - 2.0 mmol/L - - - 4.0  4.0  6.0  1.0  3.0  4.0  2.0  -  O2 Saturation % - - - 99  98  99  100  100  82  100  100  -    Details       Multiple values from one day are sorted in reverse-chronological order          Exercise Target Goals: Exercise Program Goal: Individual exercise prescription set using results from initial 6 min walk test and THRR while considering  patient's activity barriers and safety.   Exercise Prescription Goal: Initial exercise prescription builds to 30-45 minutes a day of aerobic activity, 2-3 days per week.  Home exercise guidelines will be given to patient during program as part of exercise prescription that the participant will acknowledge.   Education: Aerobic Exercise: - Group verbal and visual presentation on the components of exercise prescription. Introduces F.I.T.T principle from ACSM for exercise prescriptions.  Reviews F.I.T.T. principles of aerobic exercise including progression. Written material given at graduation.   Education: Resistance Exercise: - Group verbal and visual presentation on the components of exercise prescription. Introduces F.I.T.T principle from ACSM for exercise prescriptions  Reviews F.I.T.T. principles of resistance exercise including progression. Written material given at graduation.    Education: Exercise & Equipment Safety: - Individual verbal instruction and demonstration of equipment use and safety with use of the  equipment. Flowsheet Row Cardiac Rehab from 01/20/2024 in [facility] Cardiac and Pulmonary Rehab  Date 12/29/23  Educator NT  Instruction Review Code 1- Verbalizes Understanding       Education: Exercise Physiology & General Exercise Guidelines: - Group verbal and written instruction with models to review the exercise physiology of the cardiovascular system and associated critical values. Provides general exercise guidelines with specific guidelines to those with heart or lung disease.    Education: Flexibility, Balance, Mind/Body Relaxation: - Group verbal and visual presentation with interactive activity on the components of exercise prescription. Introduces F.I.T.T principle from ACSM for exercise prescriptions. Reviews F.I.T.T. principles of flexibility and balance exercise training including progression. Also discusses the mind body connection.  Reviews various relaxation techniques to help reduce and manage stress (i.e. Deep breathing, progressive muscle relaxation, and visualization). Balance handout provided to take home. Written material given at graduation.   Activity Barriers & Risk Stratification:  Activity Barriers & Cardiac Risk Stratification - 12/29/23 1122       Activity Barriers & Cardiac Risk Stratification   Activity Barriers Back Problems    Cardiac Risk Stratification High             6 Minute Walk:  6 Minute Walk     Row Name 12/29/23 1120         6 Minute Walk   Phase Initial     Distance 1330 feet     Walk Time 6 minutes     # of Rest Breaks 0     MPH 2.52     METS 3.49     RPE 9     Perceived Dyspnea  1     VO2 Peak 12.22     Symptoms No     Resting HR 52 bpm     Resting BP 124/58     Resting Oxygen Saturation  99 %     Exercise Oxygen Saturation  during 6 min walk 99 %  Max Ex. HR 62 bpm     Max Ex. BP 128/60     2 Minute Post BP 126/64              Oxygen Initial Assessment:   Oxygen Re-Evaluation:   Oxygen Discharge (Final  Oxygen Re-Evaluation):   Initial Exercise Prescription:  Initial Exercise Prescription - 12/29/23 1100       Date of Initial Exercise RX and Referring Provider   Date 12/29/23    Referring Provider Dr. Percival Brace, MD      Oxygen   Maintain Oxygen Saturation 88% or higher      Treadmill   MPH 2.6    Grade 1    Minutes 15    METs 3.35      NuStep   Level 3   T6 nustep   SPM 80    Minutes 15    METs 3.49      REL-XR   Level 3    Speed 50    Minutes 15    METs 3.49      Prescription Details   Frequency (times per week) 3    Duration Progress to 30 minutes of continuous aerobic without signs/symptoms of physical distress      Intensity   THRR 40-80% of Max Heartrate 95-139    Ratings of Perceived Exertion 11-13    Perceived Dyspnea 0-4      Progression   Progression Continue to progress workloads to maintain intensity without signs/symptoms of physical distress.      Resistance Training   Training Prescription Yes    Weight 4 lb    Reps 10-15             Perform Capillary Blood Glucose checks as needed.  Exercise Prescription Changes:   Exercise Prescription Changes     Row Name 12/29/23 1100 01/12/24 1300 01/25/24 1500         Response to Exercise   Blood Pressure (Admit) 124/58 120/58 122/60     Blood Pressure (Exercise) 128/60 144/62 128/64     Blood Pressure (Exit) 126/64 108/50 122/62     Heart Rate (Admit) 52 bpm 60 bpm 66 bpm     Heart Rate (Exercise) 62 bpm 78 bpm 75 bpm     Heart Rate (Exit) 52 bpm 59 bpm 68 bpm     Oxygen Saturation (Admit) 99 % -- --     Oxygen Saturation (Exercise) 99 % -- --     Rating of Perceived Exertion (Exercise) 9 13 13      Perceived Dyspnea (Exercise) 1 0 0     Symptoms none none none     Comments Results First 2 weeks of exercise --     Duration -- Progress to 30 minutes of  aerobic without signs/symptoms of physical distress Progress to 30 minutes of  aerobic without signs/symptoms of  physical distress     Intensity -- THRR unchanged THRR unchanged       Progression   Progression -- Continue to progress workloads to maintain intensity without signs/symptoms of physical distress. Continue to progress workloads to maintain intensity without signs/symptoms of physical distress.     Average METs -- 3.16 3.22       Resistance Training   Training Prescription -- Yes Yes     Weight -- 4 4     Reps -- 10-15 10-15       Interval Training   Interval Training -- No No  Treadmill   MPH -- 2.5 2.9     Grade -- 1 1.5     Minutes -- 15 15     METs -- 3.26 3.82       NuStep   Level -- -- 3     Minutes -- -- 15     METs -- -- 2.2       REL-XR   Level -- 3 5     Minutes -- 15 15     METs -- 3.1 3.6       Oxygen   Maintain Oxygen Saturation -- 88% or higher 88% or higher              Exercise Comments:   Exercise Comments     Row Name 01/03/24 0936           Exercise Comments First full day of exercise!  Patient was oriented to gym and equipment including functions, settings, policies, and procedures.  Patient's individual exercise prescription and treatment plan were reviewed.  All starting workloads were established based on the results of the 6 minute walk test done at initial orientation visit.  The plan for exercise progression was also introduced and progression will be customized based on patient's performance and goals.                Exercise Goals and Review:   Exercise Goals     Row Name 12/29/23 1122             Exercise Goals   Increase Physical Activity Yes       Intervention Develop an individualized exercise prescription for aerobic and resistive training based on initial evaluation findings, risk stratification, comorbidities and participant's personal goals.;Provide advice, education, support and counseling about physical activity/exercise needs.       Expected Outcomes Short Term: Attend rehab on a regular basis to  increase amount of physical activity.;Long Term: Add in home exercise to make exercise part of routine and to increase amount of physical activity.;Long Term: Exercising regularly at least 3-5 days a week.       Increase Strength and Stamina Yes       Intervention Provide advice, education, support and counseling about physical activity/exercise needs.;Develop an individualized exercise prescription for aerobic and resistive training based on initial evaluation findings, risk stratification, comorbidities and participant's personal goals.       Expected Outcomes Short Term: Increase workloads from initial exercise prescription for resistance, speed, and METs.;Short Term: Perform resistance training exercises routinely during rehab and add in resistance training at home;Long Term: Improve cardiorespiratory fitness, muscular endurance and strength as measured by increased METs and functional capacity ( )       Able to understand and use rate of perceived exertion (RPE) scale Yes       Intervention Provide education and explanation on how to use RPE scale       Expected Outcomes Short Term: Able to use RPE daily in rehab to express subjective intensity level;Long Term:  Able to use RPE to guide intensity level when exercising independently       Able to understand and use Dyspnea scale Yes       Intervention Provide education and explanation on how to use Dyspnea scale       Expected Outcomes Short Term: Able to use Dyspnea scale daily in rehab to express subjective sense of shortness of breath during exertion;Long Term: Able to use Dyspnea scale to guide intensity level when exercising independently  Knowledge and understanding of Target Heart Rate Range (THRR) Yes       Intervention Provide education and explanation of THRR including how the numbers were predicted and where they are located for reference       Expected Outcomes Short Term: Able to state/look up THRR;Long Term: Able to use THRR to  govern intensity when exercising independently;Short Term: Able to use daily as guideline for intensity in rehab       Able to check pulse independently Yes       Intervention Review the importance of being able to check your own pulse for safety during independent exercise;Provide education and demonstration on how to check pulse in carotid and radial arteries.       Expected Outcomes Short Term: Able to explain why pulse checking is important during independent exercise;Long Term: Able to check pulse independently and accurately       Understanding of Exercise Prescription Yes       Intervention Provide education, explanation, and written materials on patient's individual exercise prescription       Expected Outcomes Long Term: Able to explain home exercise prescription to exercise independently;Short Term: Able to explain program exercise prescription                Exercise Goals Re-Evaluation :  Exercise Goals Re-Evaluation     Row Name 01/03/24 0936 01/12/24 1351 01/13/24 0924 01/25/24 1541       Exercise Goal Re-Evaluation   Exercise Goals Review Able to understand and use rate of perceived exertion (RPE) scale;Knowledge and understanding of Target Heart Rate Range (THRR);Able to understand and use Dyspnea scale;Understanding of Exercise Prescription Increase Physical Activity;Increase Strength and Stamina;Understanding of Exercise Prescription Increase Physical Activity;Increase Strength and Stamina;Understanding of Exercise Prescription Increase Physical Activity;Increase Strength and Stamina;Understanding of Exercise Prescription    Comments Reviewed RPE and dyspnea scale, THR and program prescription with pt today.  Pt voiced understanding and was given a copy of goals to take home. Other is off to a great start in the program. He was able to attend his first 2 sessions during this review. He was also able to use the treadmill at 2. at a 1% incline, and the XR at level 3. We will  continue to monitor his progress in the program. Jarquez reports he is doing well with exercise here at rehab. He has done the treadmill and XR machines. Today he will try the T6. Encouraged him to increase workload as able. For exercise outside of rehab he says he is outside working with his chicken and is active with his dogs. Aundrae is doing well in rehab. He was able to increase his treadmill workload to a speed of 2. and 1.5% incline. He was also able to increase from level 3 to 5 on the XR. We will continue to monitor his progress in the program.    Expected Outcomes Short: Use RPE daily to regulate intensity. Long: Follow program prescription in THR. Short: Continue to follow exercise prescription. Long: Continue exercise to improve strength and stamina. STg: Continue to follow exercise prescription and increase workloads as able. Continue exercise to improve strength and stamina. Short: Continue to increase treadmill workload. Long: Continue exercise to improve strength and stamina.             Discharge Exercise Prescription (Final Exercise Prescription Changes):  Exercise Prescription Changes - 01/25/24 1500       Response to Exercise   Blood Pressure (Admit) 122/60  Blood Pressure (Exercise) 128/64    Blood Pressure (Exit) 122/62    Heart Rate (Admit) 66 bpm    Heart Rate (Exercise) 75 bpm    Heart Rate (Exit) 68 bpm    Rating of Perceived Exertion (Exercise) 13    Perceived Dyspnea (Exercise) 0    Symptoms none    Duration Progress to 30 minutes of  aerobic without signs/symptoms of physical distress    Intensity THRR unchanged      Progression   Progression Continue to progress workloads to maintain intensity without signs/symptoms of physical distress.    Average METs 3.22      Resistance Training   Training Prescription Yes    Weight 4    Reps 10-15      Interval Training   Interval Training No      Treadmill   MPH 2.9    Grade 1.5    Minutes 15    METs 3.82       NuStep   Level 3    Minutes 15    METs 2.2      REL-XR   Level 5    Minutes 15    METs 3.6      Oxygen   Maintain Oxygen Saturation 88% or higher             Nutrition:  Target Goals: Understanding of nutrition guidelines, daily intake of sodium 1500mg , cholesterol 200mg , calories 30% from fat and 7% or less from saturated fats, daily to have 5 or more servings of fruits and vegetables.  Education: All About Nutrition: -Group instruction provided by verbal, written material, interactive activities, discussions, models, and posters to present general guidelines for heart healthy nutrition including fat, fiber, MyPlate, the role of sodium in heart healthy nutrition, utilization of the nutrition label, and utilization of this knowledge for meal planning. Follow up email sent as well. Written material given at graduation.   Biometrics:  Pre Biometrics - 12/29/23 1123       Pre Biometrics   Height 5' 9.5" (1.765 m)    Weight 157 lb 11.2 oz (71.5 kg)    Waist Circumference 34 inches    Hip Circumference 36 inches    Waist to Hip Ratio 0.94 %    BMI (Calculated) 22.96    Single Leg Stand 23.7 seconds              Nutrition Therapy Plan and Nutrition Goals:  Nutrition Therapy & Goals - 12/29/23 1346       Nutrition Therapy   Diet Cardiac, Low Na    Protein (specify units) 75-90    Fiber 30 grams    Whole Grain Foods 3 servings    Saturated Fats 15 max. grams    Fruits and Vegetables 5 servings/day    Sodium 2 grams      Personal Nutrition Goals   Nutrition Goal Read labels and reduce sodium intake to below 2300mg . Ideally 1500mg  per day.    Personal Goal #2 Reduce saturated fat, less than 12g per day. Replace bad fats for more heart healthy fats.    Personal Goal #3 Eat 15-30gProtein and 30-60gCarbs at each meal.    Comments Patient drinking ~32oz of water  and ~32oz of regular soda. Spoke with him about drinking sugary and calorie dense beverages. He  feels its part of his morning routine and would struggle with energy without it. His A1C is normal. Reviewed mediterranean diet handout. Educated on types of fats, sources and  how to read label. Reviewed balanced plates handout. Discussed the importance of including colorful produce at plates. Explained the benefits of pairing carbs with protein or healthy fats. Brainstormed meal and snacks ideas with focus on variety while keeping sodium and saturated fat intake below the limits provided of 1500mg  and 12g respectively      Intervention Plan   Intervention Prescribe, educate and counsel regarding individualized specific dietary modifications aiming towards targeted core components such as weight, hypertension, lipid management, diabetes, heart failure and other comorbidities.;Nutrition handout(s) given to patient.    Expected Outcomes Long Term Goal: Adherence to prescribed nutrition plan.;Short Term Goal: A plan has been developed with personal nutrition goals set during dietitian appointment.;Short Term Goal: Understand basic principles of dietary content, such as calories, fat, sodium, cholesterol and nutrients.             Nutrition Assessments:  MEDIFICTS Score Key: >=70 Need to make dietary changes  40-70 Heart Healthy Diet <= 40 Therapeutic Level Cholesterol Diet  Flowsheet Row Cardiac Rehab from 12/29/2023 in [facility] Cardiac and Pulmonary Rehab  Picture Your Plate Total Score on Admission 63      Picture Your Plate Scores: <16 Unhealthy dietary pattern with much room for improvement. 41-50 Dietary pattern unlikely to meet recommendations for good health and room for improvement. 51-60 More healthful dietary pattern, with some room for improvement.  >60 Healthy dietary pattern, although there may be some specific behaviors that could be improved.    Nutrition Goals Re-Evaluation:  Nutrition Goals Re-Evaluation     Row Name 01/13/24 0933             Goals   Comment Gahel  reports he feels he is doing well with nutrition. He says his portions are small but he includes protein at his meals. Reminded him to read labels and keep sodium controlled, below 1500mg  ideally.       Expected Outcome STG: limit sodium below 1500mg . LTG: Follow a heart healthy diet that supports bodies needs                Nutrition Goals Discharge (Final Nutrition Goals Re-Evaluation):  Nutrition Goals Re-Evaluation - 01/13/24 0933       Goals   Comment Weslie reports he feels he is doing well with nutrition. He says his portions are small but he includes protein at his meals. Reminded him to read labels and keep sodium controlled, below 1500mg  ideally.    Expected Outcome STG: limit sodium below 1500mg . LTG: Follow a heart healthy diet that supports bodies needs             Psychosocial: Target Goals: Acknowledge presence or absence of significant depression and/or stress, maximize coping skills, provide positive support system. Participant is able to verbalize types and ability to use techniques and skills needed for reducing stress and depression.   Education: Stress, Anxiety, and Depression - Group verbal and visual presentation to define topics covered.  Reviews how body is impacted by stress, anxiety, and depression.  Also discusses healthy ways to reduce stress and to treat/manage anxiety and depression.  Written material given at graduation.   Education: Sleep Hygiene -Provides group verbal and written instruction about how sleep can affect your health.  Define sleep hygiene, discuss sleep cycles and impact of sleep habits. Review good sleep hygiene tips.    Initial Review & Psychosocial Screening:  Initial Psych Review & Screening - 12/23/23 1446       Initial Review   Current  issues with None Identified      Family Dynamics   Good Support System? Yes   wife nad brother     Barriers   Psychosocial barriers to participate in program There are no identifiable  barriers or psychosocial needs.      Screening Interventions   Interventions Encouraged to exercise;To provide support and resources with identified psychosocial needs;Provide feedback about the scores to participant    Expected Outcomes Short Term goal: Utilizing psychosocial counselor, staff and physician to assist with identification of specific Stressors or current issues interfering with healing process. Setting desired goal for each stressor or current issue identified.;Long Term Goal: Stressors or current issues are controlled or eliminated.;Short Term goal: Identification and review with participant of any Quality of Life or Depression concerns found by scoring the questionnaire.;Long Term goal: The participant improves quality of Life and PHQ9 Scores as seen by post scores and/or verbalization of changes             Quality of Life Scores:   Scores of 19 and below usually indicate a poorer quality of life in these areas.  A difference of  2-3 points is a clinically meaningful difference.  A difference of 2-3 points in the total score of the Quality of Life Index has been associated with significant improvement in overall quality of life, self-image, physical symptoms, and general health in studies assessing change in quality of life.  PHQ-9: Review Flowsheet       12/29/2023 05/19/2022  Depression screen PHQ 2/9  Decreased Interest 0 0  Down, Depressed, Hopeless 0 0  PHQ - 2 Score 0 0  Altered sleeping 1 -  Tired, decreased energy 0 -  Change in appetite 0 -  Feeling bad or failure about yourself  0 -  Trouble concentrating 0 -  Moving slowly or fidgety/restless 0 -  Suicidal thoughts 0 -  PHQ-9 Score 1 -  Difficult doing work/chores Not difficult at all -   Interpretation of Total Score  Total Score Depression Severity:  1-4 = Minimal depression, 5-9 = Mild depression, 10-14 = Moderate depression, 15-19 = Moderately severe depression, 20-27 = Severe depression    Psychosocial Evaluation and Intervention:  Psychosocial Evaluation - 12/23/23 1502       Psychosocial Evaluation & Interventions   Interventions Encouraged to exercise with the program and follow exercise prescription    Comments There are o barriers to attending the program.  HE wishes to continue to heal and get back to his lawn mower, 4 whellers and usual activities.   He lives with his wife and she is his support.   He is ready to start the program.    Expected Outcomes STG attend all scheduled sessions, follow exercise progression guidelines.  LTG continues with exercise progression and nutriton plan to continue to work on heart healthy lifestyle    Continue Psychosocial Services  Follow up required by staff             Psychosocial Re-Evaluation:  Psychosocial Re-Evaluation     Row Name 01/13/24 [phone]             Psychosocial Re-Evaluation   Current issues with None Identified       Comments Anthony denies any anxiety, depression or stress. Says he has a good support system and sleeps well. Reports he gets ~7hrs per night and wakes up well rested.       Expected Outcomes STG: Continue to attend cardiac rehab. LTG: achieve and  maintain a positive outlook on health and daily life       Interventions Encouraged to attend Cardiac Rehabilitation for the exercise       Continue Psychosocial Services  Follow up required by staff                Psychosocial Discharge (Final Psychosocial Re-Evaluation):  Psychosocial Re-Evaluation - 01/13/24 0927       Psychosocial Re-Evaluation   Current issues with None Identified    Comments Mosi denies any anxiety, depression or stress. Says he has a good support system and sleeps well. Reports he gets ~7hrs per night and wakes up well rested.    Expected Outcomes STG: Continue to attend cardiac rehab. LTG: achieve and maintain a positive outlook on health and daily life    Interventions Encouraged to attend Cardiac Rehabilitation for  the exercise    Continue Psychosocial Services  Follow up required by staff             Vocational Rehabilitation: Provide vocational rehab assistance to qualifying candidates.   Vocational Rehab Evaluation & Intervention:   Education: Education Goals: Education classes will be provided on a variety of topics geared toward better understanding of heart health and risk factor modification. Participant will state understanding/return demonstration of topics presented as noted by education test scores.  Learning Barriers/Preferences:   General Cardiac Education Topics:  AED/CPR: - Group verbal and written instruction with the use of models to demonstrate the basic use of the AED with the basic ABC's of resuscitation.   Anatomy and Cardiac Procedures: - Group verbal and visual presentation and models provide information about basic cardiac anatomy and function. Reviews the testing methods done to diagnose heart disease and the outcomes of the test results. Describes the treatment choices: Medical Management, Angioplasty, or Coronary Bypass Surgery for treating various heart conditions including Myocardial Infarction, Angina, Valve Disease, and Cardiac Arrhythmias.  Written material given at graduation.   Medication Safety: - Group verbal and visual instruction to review commonly prescribed medications for heart and lung disease. Reviews the medication, class of the drug, and side effects. Includes the steps to properly store meds and maintain the prescription regimen.  Written material given at graduation.   Intimacy: - Group verbal instruction through game format to discuss how heart and lung disease can affect sexual intimacy. Written material given at graduation..   Know Your Numbers and Heart Failure: - Group verbal and visual instruction to discuss disease risk factors for cardiac and pulmonary disease and treatment options.  Reviews associated critical values for  Overweight/Obesity, Hypertension, Cholesterol, and Diabetes.  Discusses basics of heart failure: signs/symptoms and treatments.  Introduces Heart Failure Zone chart for action plan for heart failure.  Written material given at graduation. Flowsheet Row Cardiac Rehab from 01/20/2024 in [facility] Cardiac and Pulmonary Rehab  Date 01/13/24  Educator SB  Instruction Review Code 1- Verbalizes Understanding       Infection Prevention: - Provides verbal and written material to individual with discussion of infection control including proper hand washing and proper equipment cleaning during exercise session. Flowsheet Row Cardiac Rehab from 01/20/2024 in [facility] Cardiac and Pulmonary Rehab  Date 12/29/23  Educator NT  Instruction Review Code 1- Verbalizes Understanding       Falls Prevention: - Provides verbal and written material to individual with discussion of falls prevention and safety. Flowsheet Row Cardiac Rehab from 01/20/2024 in [facility] Cardiac and Pulmonary Rehab  Date 12/23/23  Educator SB  Instruction Review Code  1- Verbalizes Understanding       Other: -Provides group and verbal instruction on various topics (see comments)   Knowledge Questionnaire Score:   Core Components/Risk Factors/Patient Goals at Admission:  Personal Goals and Risk Factors at Admission - 12/23/23 1447       Core Components/Risk Factors/Patient Goals on Admission    Weight Management Yes    Intervention Weight Management: Develop a combined nutrition and exercise program designed to reach desired caloric intake, while maintaining appropriate intake of nutrient and fiber, sodium and fats, and appropriate energy expenditure required for the weight goal.;Weight Management: Provide education and appropriate resources to help participant work on and attain dietary goals.    Admit Weight 154 lb (69.9 kg)    Goal Weight: Short Term 155 lb (70.3 kg)    Goal Weight: Long Term 178 lb (80.7 kg)    Expected Outcomes Short  Term: Continue to assess and modify interventions until short term weight is achieved;Long Term: Adherence to nutrition and physical activity/exercise program aimed toward attainment of established weight goal;Weight Gain: Understanding of general recommendations for a high calorie, high protein meal plan that promotes weight gain by distributing calorie intake throughout the day with the consumption for 4-5 meals, snacks, and/or supplements    Hypertension Yes    Intervention Provide education on lifestyle modifcations including regular physical activity/exercise, weight management, moderate sodium restriction and increased consumption of fresh fruit, vegetables, and low fat dairy, alcohol moderation, and smoking cessation.;Monitor prescription use compliance.    Expected Outcomes Short Term: Continued assessment and intervention until BP is < 140/27mm HG in hypertensive participants. < 130/31mm HG in hypertensive participants with diabetes, heart failure or chronic kidney disease.;Long Term: Maintenance of blood pressure at goal levels.    Lipids Yes    Intervention Provide education and support for participant on nutrition & aerobic/resistive exercise along with prescribed medications to achieve LDL 70mg , HDL >40mg .    Expected Outcomes Short Term: Participant states understanding of desired cholesterol values and is compliant with medications prescribed. Participant is following exercise prescription and nutrition guidelines.;Long Term: Cholesterol controlled with medications as prescribed, with individualized exercise RX and with personalized nutrition plan. Value goals: LDL < 70mg , HDL > 40 mg.             Education:Diabetes - Individual verbal and written instruction to review signs/symptoms of diabetes, desired ranges of glucose level fasting, after meals and with exercise. Acknowledge that pre and post exercise glucose checks will be done for 3 sessions at entry of program.   Core  Components/Risk Factors/Patient Goals Review:   Goals and Risk Factor Review     Row Name 01/13/24 0935             Core Components/Risk Factors/Patient Goals Review   Personal Goals Review Hypertension       Review [employer] reports he does check his blood pressure at home. Says it consistently matches the readings her at rehab. Encouraged him to continue to check his BP at home and let the rehab team if there are any changes in readings       Expected Outcomes STG: Check BP at home. LTG: manage risk factors independently                Core Components/Risk Factors/Patient Goals at Discharge (Final Review):   Goals and Risk Factor Review - 01/13/24 0935       Core Components/Risk Factors/Patient Goals Review   Personal Goals Review Hypertension  Review [employer] reports he does check his blood pressure at home. Says it consistently matches the readings her at rehab. Encouraged him to continue to check his BP at home and let the rehab team if there are any changes in readings    Expected Outcomes STG: Check BP at home. LTG: manage risk factors independently             ITP Comments:  ITP Comments     Row Name 12/23/23 1501 12/29/23 1130 01/03/24 0935 01/26/24 1329     ITP Comments Virtual orientation call completed today. he has an appointment on Date: 12/29/2023  for EP eval and gym Orientation.  Documentation of diagnosis [facility] be found in [facility] 11/08/2023 . Completed and gym orientation for cardiac rehab. Initial ITP created and sent for review to Dr. Firman Hughes, Medical Director. First full day of exercise!  Patient was oriented to gym and equipment including functions, settings, policies, and procedures.  Patient's individual exercise prescription and treatment plan were reviewed.  All starting workloads were established based on the results of the 6 minute walk test done at initial orientation visit.  The plan for exercise progression was also introduced and progression will  be customized based on patient's performance and goals. 30 Day review completed. Medical Director ITP review done, changes made as directed, and signed approval by Medical Director.    new to prpgram             Comments:

## 2024-01-26 NOTE — Progress Notes (Addendum)
  Progress Note    01/26/2024 6:44 AM 1 Day Post-Op  Subjective:  sitting in chair; no trouble swallowing; has voided  Afebrile HR 50's 110's-120's systolic 96% RA  Gtts:  none  Vitals:   01/26/24 0300 01/26/24 0400  BP: 121/61 (!) 123/92  Pulse: (!) 50 (!) 56  Resp: 14 14  Temp:  (!) 97.4 F (36.3 C)  SpO2: 95% 100%     Physical Exam: Neuro:  in tact; tongue is midline Lungs:  non labored Incision:  clean and dry without hematoma  CBC    Component Value Date/Time   WBC 9.6 01/26/2024 0400   RBC 4.56 01/26/2024 0400   HGB 13.2 01/26/2024 0400   HCT 39.4 01/26/2024 0400   PLT 181 01/26/2024 0400   MCV 86.4 01/26/2024 0400   MCH 28.9 01/26/2024 0400   MCHC 33.5 01/26/2024 0400   RDW 14.8 01/26/2024 0400   LYMPHSABS 1,833 05/27/2022 1005   MONOABS 0.9 11/10/2018 2028   EOSABS 40 05/27/2022 1005   BASOSABS 32 05/27/2022 1005    BMET    Component Value Date/Time   NA 137 01/26/2024 0400   K 3.8 01/26/2024 0400   CL 108 01/26/2024 0400   CO2 21 (L) 01/26/2024 0400   GLUCOSE 146 (H) 01/26/2024 0400   BUN 9 01/26/2024 0400   CREATININE 0.82 01/26/2024 0400   CREATININE 0.87 05/27/2022 1005   CALCIUM  8.4 (L) 01/26/2024 0400   GFRNONAA >60 01/26/2024 0400   GFRNONAA 100 11/08/2020 1122   GFRAA 116 11/08/2020 1122     Intake/Output Summary (Last 24 hours) at 01/26/2024 0644 Last data filed at 01/26/2024 0300 Gross per 24 hour  Intake 1503 ml  Output 400 ml  Net 1103 ml     Assessment/Plan:  This is a 58 y.o. male who is s/p right CEA for asx carotid stenosis  1 Day Post-Op  -pt is doing well this am. -pt neuro exam is in tact -pt has not ambulated-will get him up walking this am -pt has voided -f/u with [facility] in 2-3 weeks with Dr. Rosalva Comber as he may need a left CEA given 80-99% stenosis. -continue asa/statin/plavix    Maryanna Smart, PA-C [facility] [phone]  VASCULAR STAFF ADDENDUM: I have independently interviewed and  examined the patient. I agree with the above.  Home today  Kayla Part MD [facility] Phone Number: [phone] 01/26/2024 7:55 AM

## 2024-01-27 ENCOUNTER — Encounter

## 2024-01-28 NOTE — Anesthesia Postprocedure Evaluation (Signed)
 Anesthesia Post Note  Patient: [name]  Procedure(s) Performed: ENDARTERECTOMY, CAROTID; WITH PATCH ANGIOPLASTY (Right: Neck)     Patient location during evaluation: PACU Anesthesia Type: General Level of consciousness: awake and alert Pain management: pain level controlled Vital Signs Assessment: post-procedure vital signs reviewed and stable Respiratory status: spontaneous breathing, nonlabored ventilation and respiratory function stable Cardiovascular status: blood pressure returned to baseline and stable Postop Assessment: no apparent nausea or vomiting Anesthetic complications: no   No notable events documented.                Chelse Matas

## 2024-01-31 ENCOUNTER — Encounter

## 2024-02-01 ENCOUNTER — Encounter

## 2024-02-03 ENCOUNTER — Encounter: Admitting: *Deleted

## 2024-02-03 DIAGNOSIS — Z951 Presence of aortocoronary bypass graft: Secondary | ICD-10-CM | POA: Diagnosis not present

## 2024-02-03 NOTE — Progress Notes (Signed)
 Daily Session Note  Patient Details  Name: ABAS SGRO MRN: [id number] Date of Birth: [birth year] Referring Provider:   Flowsheet Row Cardiac Rehab from 12/29/2023 in [facility] Cardiac and Pulmonary Rehab  Referring Provider Dr. Percival Brace, MD       Encounter Date: 02/03/2024  Check In:  Session Check In - 02/03/24 0951       Check-In   Supervising physician immediately available to respond to emergencies See telemetry face sheet for immediately available ER MD    Location [facility]-Cardiac & Pulmonary Rehab    Staff Present Maud Sorenson, RN, BSN, CCRP;Margaret Best, MS, Exercise Physiologist;Maxon Conetta BS, Exercise Physiologist;Joseph Lacinda Pica RCP,RRT,BSRT    Virtual Visit No    Medication changes reported     No    Fall or balance concerns reported    No    Warm-up and Cool-down Performed on first and last piece of equipment    Resistance Training Performed Yes    VAD Patient? No    PAD/SET Patient? No      Pain Assessment   Currently in Pain? No/denies                Social History   Tobacco Use  Smoking Status Former   Current packs/day: 0.00   Average packs/day: 0.3 packs/day for 25.0 years (6.3 ttl pk-yrs)   Types: Cigarettes   Start date: 09/1992   Quit date: 09/2017   Years since quitting: 6.3  Smokeless Tobacco Never    Goals Met:  Independence with exercise equipment Exercise tolerated well No report of concerns or symptoms today  Goals Unmet:  Not Applicable  Comments: Pt able to follow exercise prescription today without complaint.  Will continue to monitor for progression.    Dr. Firman Hughes is Medical Director for [facility] Cardiac Rehabilitation.  Dr. Fuad Aleskerov is Medical Director for [facility] Pulmonary Rehabilitation.

## 2024-02-07 ENCOUNTER — Encounter: Admitting: *Deleted

## 2024-02-07 DIAGNOSIS — Z951 Presence of aortocoronary bypass graft: Secondary | ICD-10-CM | POA: Diagnosis not present

## 2024-02-07 NOTE — Progress Notes (Signed)
 Daily Session Note  Patient Details  Name: [name] MRN: [id number] Date of Birth: 1965/10/14 Referring Provider:   Flowsheet Row Cardiac Rehab from 12/29/2023 in [facility] Cardiac and Pulmonary Rehab  Referring Provider Dr. Percival Brace, MD       Encounter Date: 02/07/2024  Check In:  Session Check In - 02/07/24 0917       Check-In   Supervising physician immediately available to respond to emergencies See telemetry face sheet for immediately available ER MD    Location [facility]-Cardiac & Pulmonary Rehab    Staff Present Maud Sorenson, RN, BSN, CCRP;Maxon Conetta BS, Exercise Physiologist;Kelly [employer], ACSM CEP, Exercise Physiologist;Jason Martina Sledge RDN,LDN    Virtual Visit No    Medication changes reported     No    Fall or balance concerns reported    No    Warm-up and Cool-down Performed on first and last piece of equipment    Resistance Training Performed Yes    VAD Patient? No    PAD/SET Patient? No      Pain Assessment   Currently in Pain? No/denies                Social History   Tobacco Use  Smoking Status Former   Current packs/day: 0.00   Average packs/day: 0.3 packs/day for 25.0 years (6.3 ttl pk-yrs)   Types: Cigarettes   Start date: 09/1992   Quit date: 09/2017   Years since quitting: 6.3  Smokeless Tobacco Never    Goals Met:  Independence with exercise equipment Exercise tolerated well No report of concerns or symptoms today  Goals Unmet:  Not Applicable  Comments: Pt able to follow exercise prescription today without complaint.  Will continue to monitor for progression.    Dr. Firman Hughes is Medical Director for [facility] Cardiac Rehabilitation.  Dr. Fuad Aleskerov is Medical Director for [facility] Pulmonary Rehabilitation.

## 2024-02-08 ENCOUNTER — Encounter (INDEPENDENT_AMBULATORY_CARE_PROVIDER_SITE_OTHER): Payer: Self-pay

## 2024-02-08 ENCOUNTER — Encounter: Admitting: *Deleted

## 2024-02-08 DIAGNOSIS — Z951 Presence of aortocoronary bypass graft: Secondary | ICD-10-CM

## 2024-02-08 NOTE — Progress Notes (Signed)
 Daily Session Note  Patient Details  Name: [name] MRN: [id number] Date of Birth: 1965-10-17 Referring Provider:   Flowsheet Row Cardiac Rehab from 12/29/2023 in [facility] Cardiac and Pulmonary Rehab  Referring Provider Dr. Percival Brace, MD       Encounter Date: 02/08/2024  Check In:  Session Check In - 02/08/24 0927       Check-In   Supervising physician immediately available to respond to emergencies See telemetry face sheet for immediately available ER MD    Location [facility]-Cardiac & Pulmonary Rehab    Staff Present Maud Sorenson, RN, BSN, CCRP;Maxon Conetta BS, Exercise Physiologist;Noah Tickle, BS, Exercise Physiologist;Jason Martina Sledge RDN,LDN    Virtual Visit No    Medication changes reported     No    Fall or balance concerns reported    No    Warm-up and Cool-down Performed on first and last piece of equipment    Resistance Training Performed Yes    VAD Patient? No    PAD/SET Patient? No      Pain Assessment   Currently in Pain? No/denies                Social History   Tobacco Use  Smoking Status Former   Current packs/day: 0.00   Average packs/day: 0.3 packs/day for 25.0 years (6.3 ttl pk-yrs)   Types: Cigarettes   Start date: 09/1992   Quit date: 09/2017   Years since quitting: 6.3  Smokeless Tobacco Never    Goals Met:  Independence with exercise equipment Exercise tolerated well No report of concerns or symptoms today  Goals Unmet:  Not Applicable  Comments: Pt able to follow exercise prescription today without complaint.  Will continue to monitor for progression.    Dr. Firman Hughes is Medical Director for [facility] Cardiac Rehabilitation.  Dr. Fuad Aleskerov is Medical Director for [facility] Pulmonary Rehabilitation.

## 2024-02-10 ENCOUNTER — Encounter: Admitting: *Deleted

## 2024-02-10 DIAGNOSIS — Z951 Presence of aortocoronary bypass graft: Secondary | ICD-10-CM

## 2024-02-10 NOTE — Progress Notes (Signed)
 Daily Session Note  Patient Details  Name: [name] MRN: [id number] Date of Birth: [birth year] Referring Provider:   Flowsheet Row Cardiac Rehab from 12/29/2023 in [facility] Cardiac and Pulmonary Rehab  Referring Provider Dr. Percival Brace, MD       Encounter Date: 02/10/2024  Check In:  Session Check In - 02/10/24 0946       Check-In   Supervising physician immediately available to respond to emergencies See telemetry face sheet for immediately available ER MD    Location [facility]-Cardiac & Pulmonary Rehab    Staff Present Maud Sorenson, RN, BSN, CCRP;Joseph Hood RCP,RRT,BSRT;Margaret Best, MS, Exercise Physiologist;Maxon Conetta BS, Exercise Physiologist    Virtual Visit No    Medication changes reported     No    Fall or balance concerns reported    No    Warm-up and Cool-down Performed on first and last piece of equipment    Resistance Training Performed Yes    VAD Patient? No    PAD/SET Patient? No      Pain Assessment   Currently in Pain? No/denies                Social History   Tobacco Use  Smoking Status Former   Current packs/day: 0.00   Average packs/day: 0.3 packs/day for 25.0 years (6.3 ttl pk-yrs)   Types: Cigarettes   Start date: 09/1992   Quit date: 09/2017   Years since quitting: 6.3  Smokeless Tobacco Never    Goals Met:  Independence with exercise equipment Exercise tolerated well No report of concerns or symptoms today  Goals Unmet:  Not Applicable  Comments: Pt able to follow exercise prescription today without complaint.  Will continue to monitor for progression.  Dr. Firman Hughes is Medical Director for [facility] Cardiac Rehabilitation.  Dr. Fuad Aleskerov is Medical Director for [facility] Pulmonary Rehabilitation.

## 2024-02-15 ENCOUNTER — Encounter: Admitting: *Deleted

## 2024-02-15 DIAGNOSIS — Z951 Presence of aortocoronary bypass graft: Secondary | ICD-10-CM | POA: Diagnosis not present

## 2024-02-15 NOTE — Progress Notes (Signed)
 Daily Session Note  Patient Details  Name: [name] MRN: [id number] Date of Birth: 02/27/1966 Referring Provider:   Flowsheet Row Cardiac Rehab from 12/29/2023 in [facility] Cardiac and Pulmonary Rehab  Referring Provider Dr. Percival Brace, MD       Encounter Date: 02/15/2024  Check In:  Session Check In - 02/15/24 0929       Check-In   Supervising physician immediately available to respond to emergencies See telemetry face sheet for immediately available ER MD    Staff Present Maud Sorenson, RN, BSN, CCRP;Noah Tickle, BS, Exercise Physiologist;Maxon Conetta BS, Exercise Physiologist;Jason Martina Sledge RDN,LDN    Virtual Visit No    Medication changes reported     No    Fall or balance concerns reported    No    Warm-up and Cool-down Performed on first and last piece of equipment    Resistance Training Performed Yes    VAD Patient? No    PAD/SET Patient? No      Pain Assessment   Currently in Pain? No/denies                Social History   Tobacco Use  Smoking Status Former   Current packs/day: 0.00   Average packs/day: 0.3 packs/day for 25.0 years (6.3 ttl pk-yrs)   Types: Cigarettes   Start date: 09/1992   Quit date: 09/2017   Years since quitting: 6.4  Smokeless Tobacco Never    Goals Met:  Independence with exercise equipment Exercise tolerated well No report of concerns or symptoms today  Goals Unmet:  Not Applicable  Comments: Pt able to follow exercise prescription today without complaint.  Will continue to monitor for progression.    Dr. Firman Hughes is Medical Director for [facility] Cardiac Rehabilitation.  Dr. Fuad Aleskerov is Medical Director for [facility] Pulmonary Rehabilitation.

## 2024-02-17 ENCOUNTER — Encounter: Admitting: *Deleted

## 2024-02-17 ENCOUNTER — Encounter: Payer: Self-pay | Admitting: Physician Assistant

## 2024-02-17 ENCOUNTER — Encounter

## 2024-02-17 ENCOUNTER — Other Ambulatory Visit: Payer: Self-pay

## 2024-02-17 ENCOUNTER — Ambulatory Visit: Attending: Vascular Surgery | Admitting: Physician Assistant

## 2024-02-17 VITALS — BP 116/68 | HR 55 | Temp 98.2°F | Ht 69.0 in | Wt 155.2 lb

## 2024-02-17 DIAGNOSIS — Z951 Presence of aortocoronary bypass graft: Secondary | ICD-10-CM

## 2024-02-17 DIAGNOSIS — I6523 Occlusion and stenosis of bilateral carotid arteries: Secondary | ICD-10-CM

## 2024-02-17 NOTE — Progress Notes (Signed)
  POST OPERATIVE OFFICE NOTE    CC:  F/u for surgery  HPI:  This is a 58 y.o. male who was found to have asymptomatic critical right-sided ICA stenosis. He is s/p right CEA on 01/25/24 by Dr. Rosalva Comber.  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 He has done well, and is currently in cardiopulmonary rehab.   Pt returns today for follow up.  Pt states he has no symptoms of stroke or TIA.  He denies amaurosis, weakness in extremities or aphasia.  He wants to talk about the left ICA stenosis today.   Allergies  Allergen Reactions   Atorvastatin Other (See Comments)    Pain and cramps. Like "hit by a truck"    Lopid [Gemfibrozil] Other (See Comments)    Legs hurt    Current Outpatient Medications  Medication Sig Dispense Refill   amLODipine  (NORVASC ) 10 MG tablet Take 10 mg by mouth daily.     aspirin  81 MG tablet Take 81 mg by mouth daily.      clopidogrel  (PLAVIX ) 75 MG tablet Take 1 tablet (75 mg total) by mouth daily. 90 tablet 1   Coenzyme Q10 100 MG TABS Take 100 mg by mouth daily.     loratadine  (CLARITIN ) 10 MG tablet Take 1 tablet (10 mg total) by mouth daily. 90 tablet 1   metoprolol  succinate (TOPROL -XL) 100 MG 24 hr tablet Take 1 tablet (100 mg total) by mouth daily. Take with or immediately following a meal. 30 tablet 1   Omega-3 Fatty Acids (FISH OIL PO) Take 690 mg by mouth daily.     oxyCODONE -acetaminophen  (PERCOCET) 5-325 MG tablet Take 1 tablet by mouth every 6 (six) hours as needed for severe pain (pain score 7-10). 8 tablet 0   pravastatin  (PRAVACHOL ) 40 MG tablet Take 1 tablet (40 mg total) by mouth at bedtime. 30 tablet 2   amiodarone  (PACERONE ) 200 MG tablet Take 2 tablets (400mg ) twice per day for 7 days, then take 1 tablet (200mg ) twice per day for 7 days, then take 1 tablet (200mg ) daily thereafter (Patient not taking: Reported on 02/17/2024) 60 tablet 1   furosemide  (LASIX ) 40 MG tablet Take 1 tablet (40 mg total) by mouth daily. (Patient not  taking: Reported on 02/17/2024) 7 tablet 0   No current facility-administered medications for this visit.     ROS:  See HPI  Physical Exam:    Incision:  well healed right neck incision Extremities:  palpable radial pulses, right neck numbness surrounding the incision Neuro: no facial droop or tongue deviation Lungs :  non labored clear to auscultation    Assessment/Plan:  This is a 58 y.o. male who is s/p: Right CEA.  We reviewed the CTA and carotid duplex which shows he has asymptomatic ICA stenosis > than 80% on the left as well.  We will plan left CEA with DR. Robins at the end of June.  He will continue to take ASA and Plavix .  Continue cardiac rehab.     Rocky Cipro PA-C [facility] [phone]   Clinic MD:  Rosalva Comber

## 2024-02-17 NOTE — Progress Notes (Signed)
 Daily Session Note  Patient Details  Name: SLAYTON LUBITZ MRN: [id number] Date of Birth: Mar 14, 1966 Referring Provider:   Flowsheet Row Cardiac Rehab from 12/29/2023 in [facility] Cardiac and Pulmonary Rehab  Referring Provider Dr. Percival Brace, MD       Encounter Date: 02/17/2024  Check In:  Session Check In - 02/17/24 0932       Check-In   Supervising physician immediately available to respond to emergencies See telemetry face sheet for immediately available ER MD    Location [facility]-Cardiac & Pulmonary Rehab    Staff Present Maud Sorenson, RN, BSN, CCRP;Margaret Best, MS, Exercise Physiologist;Joseph Hood RCP,RRT,BSRT;Maxon Conetta BS, Exercise Physiologist    Virtual Visit No    Medication changes reported     No    Fall or balance concerns reported    No    Warm-up and Cool-down Performed on first and last piece of equipment    Resistance Training Performed Yes    VAD Patient? No    PAD/SET Patient? No      Pain Assessment   Currently in Pain? No/denies                Social History   Tobacco Use  Smoking Status Former   Current packs/day: 0.00   Average packs/day: 0.3 packs/day for 25.0 years (6.3 ttl pk-yrs)   Types: Cigarettes   Start date: 09/1992   Quit date: 09/2017   Years since quitting: 6.4  Smokeless Tobacco Never    Goals Met:  Independence with exercise equipment Exercise tolerated well No report of concerns or symptoms today  Goals Unmet:  Not Applicable  Comments: Pt able to follow exercise prescription today without complaint.  Will continue to monitor for progression.    Dr. Firman Hughes is Medical Director for [facility] Cardiac Rehabilitation.  Dr. Fuad Aleskerov is Medical Director for [facility] Pulmonary Rehabilitation.

## 2024-02-21 ENCOUNTER — Encounter: Attending: Cardiology

## 2024-02-21 DIAGNOSIS — Z48812 Encounter for surgical aftercare following surgery on the circulatory system: Secondary | ICD-10-CM | POA: Insufficient documentation

## 2024-02-21 DIAGNOSIS — Z951 Presence of aortocoronary bypass graft: Secondary | ICD-10-CM | POA: Insufficient documentation

## 2024-02-21 NOTE — Progress Notes (Signed)
 Daily Session Note  Patient Details  Name: [name] MRN: [id number] Date of Birth: 06/30/1966 Referring Provider:   Flowsheet Row Cardiac Rehab from 12/29/2023 in [facility] Cardiac and Pulmonary Rehab  Referring Provider Dr. Percival Brace, MD       Encounter Date: 02/21/2024  Check In:  Session Check In - 02/21/24 0944       Check-In   Supervising physician immediately available to respond to emergencies See telemetry face sheet for immediately available ER MD    Location [facility]-Cardiac & Pulmonary Rehab    Staff Present Josph Nimrod RN,BSN,MPA;Joseph [facility] Sabra Cramp BS, ACSM CEP, Exercise Physiologist;Margaret Best, MS, Exercise Physiologist    Virtual Visit No    Medication changes reported     No    Fall or balance concerns reported    No    Warm-up and Cool-down Performed on first and last piece of equipment    Resistance Training Performed Yes    VAD Patient? No    PAD/SET Patient? No      Pain Assessment   Currently in Pain? No/denies                Social History   Tobacco Use  Smoking Status Former   Current packs/day: 0.00   Average packs/day: 0.3 packs/day for 25.0 years (6.3 ttl pk-yrs)   Types: Cigarettes   Start date: 09/1992   Quit date: 09/2017   Years since quitting: 6.4  Smokeless Tobacco Never    Goals Met:  Independence with exercise equipment Exercise tolerated well No report of concerns or symptoms today Strength training completed today  Goals Unmet:  Not Applicable  Comments: Pt able to follow exercise prescription today without complaint.  Will continue to monitor for progression.    Dr. Firman Hughes is Medical Director for [facility] Cardiac Rehabilitation.  Dr. Fuad Aleskerov is Medical Director for [facility] Pulmonary Rehabilitation.

## 2024-02-22 ENCOUNTER — Encounter

## 2024-02-22 DIAGNOSIS — Z951 Presence of aortocoronary bypass graft: Secondary | ICD-10-CM | POA: Diagnosis not present

## 2024-02-22 NOTE — Progress Notes (Signed)
 Daily Session Note  Patient Details  Name: [name] MRN: [id number] Date of Birth: [birth year] Referring Provider:   Flowsheet Row Cardiac Rehab from 12/29/2023 in [facility] Cardiac and Pulmonary Rehab  Referring Provider Dr. Percival Brace, MD       Encounter Date: 02/22/2024  Check In:  Session Check In - 02/22/24 0915       Check-In   Supervising physician immediately available to respond to emergencies See telemetry face sheet for immediately available ER MD    Location [facility]-Cardiac & Pulmonary Rehab    Staff Present Sherle Dire, BS, Exercise Physiologist;Mclain Freer RN,BSN,MPA;Maxon Conetta BS, Exercise Physiologist;Jason Martina Sledge RDN,LDN    Virtual Visit No    Medication changes reported     No    Fall or balance concerns reported    No    Warm-up and Cool-down Performed on first and last piece of equipment    Resistance Training Performed Yes    VAD Patient? No    PAD/SET Patient? No      Pain Assessment   Currently in Pain? No/denies                Social History   Tobacco Use  Smoking Status Former   Current packs/day: 0.00   Average packs/day: 0.3 packs/day for 25.0 years (6.3 ttl pk-yrs)   Types: Cigarettes   Start date: 09/1992   Quit date: 09/2017   Years since quitting: 6.4  Smokeless Tobacco Never    Goals Met:  Independence with exercise equipment Exercise tolerated well No report of concerns or symptoms today Strength training completed today  Goals Unmet:  Not Applicable  Comments: Pt able to follow exercise prescription today without complaint.  Will continue to monitor for progression.    Dr. Firman Hughes is Medical Director for [facility] Cardiac Rehabilitation.  Dr. Fuad Aleskerov is Medical Director for [facility] Pulmonary Rehabilitation.

## 2024-02-23 ENCOUNTER — Encounter: Payer: Self-pay | Admitting: *Deleted

## 2024-02-23 DIAGNOSIS — Z951 Presence of aortocoronary bypass graft: Secondary | ICD-10-CM

## 2024-02-23 NOTE — Progress Notes (Signed)
 Cardiac Individual Treatment Plan  Patient Details  Name: [name] MRN: [id number] Date of Birth: 1966-03-07 Referring Provider:   Flowsheet Row Cardiac Rehab from 12/29/2023 in [facility] Cardiac and Pulmonary Rehab  Referring Provider Dr. Percival Brace, MD       Initial Encounter Date:  Flowsheet Row Cardiac Rehab from 12/29/2023 in [facility] Cardiac and Pulmonary Rehab  Date 12/29/23       Visit Diagnosis: S/P CABG x 3  Patient's Home Medications on Admission:  Current Outpatient Medications:    amiodarone  (PACERONE ) 200 MG tablet, Take 2 tablets (400mg ) twice per day for 7 days, then take 1 tablet (200mg ) twice per day for 7 days, then take 1 tablet (200mg ) daily thereafter (Patient not taking: Reported on 02/17/2024), Disp: 60 tablet, Rfl: 1   amLODipine  (NORVASC ) 10 MG tablet, Take 10 mg by mouth daily., Disp: , Rfl:    aspirin  81 MG tablet, Take 81 mg by mouth daily. , Disp: , Rfl:    clopidogrel  (PLAVIX ) 75 MG tablet, Take 1 tablet (75 mg total) by mouth daily., Disp: 90 tablet, Rfl: 1   Coenzyme Q10 100 MG TABS, Take 100 mg by mouth daily., Disp: , Rfl:    furosemide  (LASIX ) 40 MG tablet, Take 1 tablet (40 mg total) by mouth daily. (Patient not taking: Reported on 02/17/2024), Disp: 7 tablet, Rfl: 0   loratadine  (CLARITIN ) 10 MG tablet, Take 1 tablet (10 mg total) by mouth daily., Disp: 90 tablet, Rfl: 1   metoprolol  succinate (TOPROL -XL) 100 MG 24 hr tablet, Take 1 tablet (100 mg total) by mouth daily. Take with or immediately following a meal., Disp: 30 tablet, Rfl: 1   Omega-3 Fatty Acids (FISH OIL PO), Take 690 mg by mouth daily., Disp: , Rfl:    oxyCODONE -acetaminophen  (PERCOCET) 5-325 MG tablet, Take 1 tablet by mouth every 6 (six) hours as needed for severe pain (pain score 7-10)., Disp: 8 tablet, Rfl: 0   pravastatin  (PRAVACHOL ) 40 MG tablet, Take 1 tablet (40 mg total) by mouth at bedtime., Disp: 30 tablet, Rfl: 2  Past Medical History: Past Medical History:   Diagnosis Date   Anxiety    CAD (coronary artery disease)    Carotid artery occlusion    Dysrhythmia    post-CABG PAF   HLD (hyperlipidemia)    Hypertension    PVC (premature ventricular contraction)    Thrombocytopenia (HCC)     Tobacco Use: Social History   Tobacco Use  Smoking Status Former   Current packs/day: 0.00   Average packs/day: 0.3 packs/day for 25.0 years (6.3 ttl pk-yrs)   Types: Cigarettes   Start date: 09/1992   Quit date: 09/2017   Years since quitting: 6.4  Smokeless Tobacco Never    Labs: Review Flowsheet  More data may exist      Latest Ref Rng & Units 11/08/2020 05/27/2022 11/04/2023 11/08/2023 01/26/2024  Labs for ITP Cardiac and Pulmonary Rehab  Cholestrol 0 - 200 mg/dL 409  811  - - 914   LDL (calc) 0 - 99 mg/dL --  782  - - 956   HDL-C >40 mg/dL 36  43  - - 49   Trlycerides <150 mg/dL 213  086  - - 64   Hemoglobin A1c 4.8 - 5.6 % - - 4.7  - -  PH, Arterial 7.35 - 7.45 - - - 7.314  7.316  7.425  7.367  7.385  7.358  7.401  -  PCO2 arterial 32 - 48 mmHg - - -  42.8  42.1  27.6  44.8  39.9  38.3  36.4  -  Bicarbonate 20.0 - 28.0 mmol/L - - - 21.6  21.4  18.4  25.7  23.9  22.1  21.6  22.6  -  TCO2 22 - 32 mmol/L - - - 23  23  19  24  27  25  25  23  23  24  22  24   -  Acid-base deficit 0.0 - 2.0 mmol/L - - - 4.0  4.0  6.0  1.0  3.0  4.0  2.0  -  O2 Saturation % - - - 99  98  99  100  100  82  100  100  -    Details       Multiple values from one day are sorted in reverse-chronological order          Exercise Target Goals: Exercise Program Goal: Individual exercise prescription set using results from initial 6 min walk test and THRR while considering  patient's activity barriers and safety.   Exercise Prescription Goal: Initial exercise prescription builds to 30-45 minutes a day of aerobic activity, 2-3 days per week.  Home exercise guidelines will be given to patient during program as part of exercise prescription that the participant will  acknowledge.   Education: Aerobic Exercise: - Group verbal and visual presentation on the components of exercise prescription. Introduces F.I.T.T principle from ACSM for exercise prescriptions.  Reviews F.I.T.T. principles of aerobic exercise including progression. Written material given at graduation. Flowsheet Row Cardiac Rehab from 02/17/2024 in [facility] Cardiac and Pulmonary Rehab  Date 02/17/24  Educator MB  Instruction Review Code 1- Verbalizes Understanding       Education: Resistance Exercise: - Group verbal and visual presentation on the components of exercise prescription. Introduces F.I.T.T principle from ACSM for exercise prescriptions  Reviews F.I.T.T. principles of resistance exercise including progression. Written material given at graduation. Flowsheet Row Cardiac Rehab from 02/17/2024 in [facility] Cardiac and Pulmonary Rehab  Date 02/10/24  Educator MB  Instruction Review Code 1- [employer] Understanding        Education: Exercise & Equipment Safety: - Individual verbal instruction and demonstration of equipment use and safety with use of the equipment. Flowsheet Row Cardiac Rehab from 02/17/2024 in [facility] Cardiac and Pulmonary Rehab  Date 12/29/23  Educator NT  Instruction Review Code 1- Verbalizes Understanding       Education: Exercise Physiology & General Exercise Guidelines: - Group verbal and written instruction with models to review the exercise physiology of the cardiovascular system and associated critical values. Provides general exercise guidelines with specific guidelines to those with heart or lung disease.  Flowsheet Row Cardiac Rehab from 02/17/2024 in [facility] Cardiac and Pulmonary Rehab  Date 02/03/24  Educator MB  Instruction Review Code 1- [employer] Understanding       Education: Flexibility, Balance, Mind/Body Relaxation: - Group verbal and visual presentation with interactive activity on the components of exercise prescription. Introduces F.I.T.T  principle from ACSM for exercise prescriptions. Reviews F.I.T.T. principles of flexibility and balance exercise training including progression. Also discusses the mind body connection.  Reviews various relaxation techniques to help reduce and manage stress (i.e. Deep breathing, progressive muscle relaxation, and visualization). Balance handout provided to take home. Written material given at graduation. Flowsheet Row Cardiac Rehab from 02/17/2024 in [facility] Cardiac and Pulmonary Rehab  Date 02/10/24  Educator MB  Instruction Review Code 1- Verbalizes Understanding       Activity Barriers & Risk  Stratification:  Activity Barriers & Cardiac Risk Stratification - 12/29/23 1122       Activity Barriers & Cardiac Risk Stratification   Activity Barriers Back Problems    Cardiac Risk Stratification High             6 Minute Walk:  6 Minute Walk     Row Name 12/29/23 1120         6 Minute Walk   Phase Initial     Distance 1330 feet     Walk Time 6 minutes     # of Rest Breaks 0     MPH 2.52     METS 3.49     RPE 9     Perceived Dyspnea  1     VO2 Peak 12.22     Symptoms No     Resting HR 52 bpm     Resting BP 124/58     Resting Oxygen Saturation  99 %     Exercise Oxygen Saturation  during 6 min walk 99 %     Max Ex. HR 62 bpm     Max Ex. BP 128/60     2 Minute Post BP 126/64              Oxygen Initial Assessment:   Oxygen Re-Evaluation:   Oxygen Discharge (Final Oxygen Re-Evaluation):   Initial Exercise Prescription:  Initial Exercise Prescription - 12/29/23 1100       Date of Initial Exercise RX and Referring Provider   Date 12/29/23    Referring Provider Dr. Percival Brace, MD      Oxygen   Maintain Oxygen Saturation 88% or higher      Treadmill   MPH 2.6    Grade 1    Minutes 15    METs 3.35      NuStep   Level 3   T6 nustep   SPM 80    Minutes 15    METs 3.49      REL-XR   Level 3    Speed 50    Minutes 15    METs 3.49       Prescription Details   Frequency (times per week) 3    Duration Progress to 30 minutes of continuous aerobic without signs/symptoms of physical distress      Intensity   THRR 40-80% of Max Heartrate 95-139    Ratings of Perceived Exertion 11-13    Perceived Dyspnea 0-4      Progression   Progression Continue to progress workloads to maintain intensity without signs/symptoms of physical distress.      Resistance Training   Training Prescription Yes    Weight 4 lb    Reps 10-15             Perform Capillary Blood Glucose checks as needed.  Exercise Prescription Changes:   Exercise Prescription Changes     Row Name 12/29/23 1100 01/12/24 1300 01/25/24 1500 02/10/24 1500 02/22/24 1400     Response to Exercise   Blood Pressure (Admit) 124/58 120/58 122/60 118/72 122/70   Blood Pressure (Exercise) 128/60 144/62 128/64 132/54 124/82   Blood Pressure (Exit) 126/64 108/50 122/62 128/64 100/64   Heart Rate (Admit) 52 bpm 60 bpm 66 bpm 63 bpm 62 bpm   Heart Rate (Exercise) 62 bpm 78 bpm 75 bpm 81 bpm 77 bpm   Heart Rate (Exit) 52 bpm 59 bpm 68 bpm 52 bpm 57 bpm   Oxygen Saturation (Admit) 99 % -- -- -- --  Oxygen Saturation (Exercise) 99 % -- -- -- --   Rating of Perceived Exertion (Exercise) 9 13 13 13 13    Perceived Dyspnea (Exercise) 1 0 0 -- --   Symptoms none none none none none   Comments Results First 2 weeks of exercise -- -- --   Duration -- Progress to 30 minutes of  aerobic without signs/symptoms of physical distress Progress to 30 minutes of  aerobic without signs/symptoms of physical distress Continue with 30 min of aerobic exercise without signs/symptoms of physical distress. Continue with 30 min of aerobic exercise without signs/symptoms of physical distress.   Intensity -- THRR unchanged THRR unchanged THRR unchanged THRR unchanged     Progression   Progression -- Continue to progress workloads to maintain intensity without signs/symptoms of physical  distress. Continue to progress workloads to maintain intensity without signs/symptoms of physical distress. Continue to progress workloads to maintain intensity without signs/symptoms of physical distress. Continue to progress workloads to maintain intensity without signs/symptoms of physical distress.   Average METs -- 3.16 3.22 3.37 3.02     Resistance Training   Training Prescription -- Yes Yes Yes Yes   Weight -- 4 4 4  lb 4 lb   Reps -- 10-15 10-15 10-15 10-15     Interval Training   Interval Training -- No No No No     Treadmill   MPH -- 2.5 2.9 2.3 2.3   Grade -- 1 1.5 1.5 3   Minutes -- 15 15 15 15    METs -- 3.26 3.82 3.23 3.71     NuStep   Level -- -- 3 -- --   Minutes -- -- 15 -- --   METs -- -- 2.2 -- --     REL-XR   Level -- 3 5 2 4    Minutes -- 15 15 15 15    METs -- 3.1 3.6 3.5 3.9     T5 Nustep   Level -- -- -- -- 4   Minutes -- -- -- -- 15   METs -- -- -- -- 2.47     Oxygen   Maintain Oxygen Saturation -- 88% or higher 88% or higher 88% or higher 88% or higher            Exercise Comments:   Exercise Comments     Row Name 01/03/24 0936           Exercise Comments First full day of exercise!  Patient was oriented to gym and equipment including functions, settings, policies, and procedures.  Patient's individual exercise prescription and treatment plan were reviewed.  All starting workloads were established based on the results of the 6 minute walk test done at initial orientation visit.  The plan for exercise progression was also introduced and progression will be customized based on patient's performance and goals.                Exercise Goals and Review:   Exercise Goals     Row Name 12/29/23 1122             Exercise Goals   Increase Physical Activity Yes       Intervention Develop an individualized exercise prescription for aerobic and resistive training based on initial evaluation findings, risk stratification, comorbidities and  participant's personal goals.;Provide advice, education, support and counseling about physical activity/exercise needs.       Expected Outcomes Short Term: Attend rehab on a regular basis to increase amount of physical activity.;Long Term: Add  in home exercise to make exercise part of routine and to increase amount of physical activity.;Long Term: Exercising regularly at least 3-5 days a week.       Increase Strength and Stamina Yes       Intervention Provide advice, education, support and counseling about physical activity/exercise needs.;Develop an individualized exercise prescription for aerobic and resistive training based on initial evaluation findings, risk stratification, comorbidities and participant's personal goals.       Expected Outcomes Short Term: Increase workloads from initial exercise prescription for resistance, speed, and METs.;Short Term: Perform resistance training exercises routinely during rehab and add in resistance training at home;Long Term: Improve cardiorespiratory fitness, muscular endurance and strength as measured by increased METs and functional capacity ( )       Able to understand and use rate of perceived exertion (RPE) scale Yes       Intervention Provide education and explanation on how to use RPE scale       Expected Outcomes Short Term: Able to use RPE daily in rehab to express subjective intensity level;Long Term:  Able to use RPE to guide intensity level when exercising independently       Able to understand and use Dyspnea scale Yes       Intervention Provide education and explanation on how to use Dyspnea scale       Expected Outcomes Short Term: Able to use Dyspnea scale daily in rehab to express subjective sense of shortness of breath during exertion;Long Term: Able to use Dyspnea scale to guide intensity level when exercising independently       Knowledge and understanding of Target Heart Rate Range (THRR) Yes       Intervention Provide education and  explanation of THRR including how the numbers were predicted and where they are located for reference       Expected Outcomes Short Term: Able to state/look up THRR;Long Term: Able to use THRR to govern intensity when exercising independently;Short Term: Able to use daily as guideline for intensity in rehab       Able to check pulse independently Yes       Intervention Review the importance of being able to check your own pulse for safety during independent exercise;Provide education and demonstration on how to check pulse in carotid and radial arteries.       Expected Outcomes Short Term: Able to explain why pulse checking is important during independent exercise;Long Term: Able to check pulse independently and accurately       Understanding of Exercise Prescription Yes       Intervention Provide education, explanation, and written materials on patient's individual exercise prescription       Expected Outcomes Long Term: Able to explain home exercise prescription to exercise independently;Short Term: Able to explain program exercise prescription                Exercise Goals Re-Evaluation :  Exercise Goals Re-Evaluation     Row Name 01/03/24 0936 01/12/24 1351 01/13/24 0924 01/25/24 1541 02/08/24 0935     Exercise Goal Re-Evaluation   Exercise Goals Review Able to understand and use rate of perceived exertion (RPE) scale;Knowledge and understanding of Target Heart Rate Range (THRR);Able to understand and use Dyspnea scale;Understanding of Exercise Prescription Increase Physical Activity;Increase Strength and Stamina;Understanding of Exercise Prescription Increase Physical Activity;Increase Strength and Stamina;Understanding of Exercise Prescription Increase Physical Activity;Increase Strength and Stamina;Understanding of Exercise Prescription Increase Physical Activity;Increase Strength and Stamina;Understanding of Exercise Prescription   Comments Reviewed  RPE and dyspnea scale, THR and  program prescription with pt today.  Pt voiced understanding and was given a copy of goals to take home. Musab is off to a great start in the program. He was able to attend his first 2 sessions during this review. He was also able to use the treadmill at 2. at a 1% incline, and the XR at level 3. We will continue to monitor his progress in the program. Martez reports he is doing well with exercise here at rehab. He has done the treadmill and XR machines. Today he will try the T6. Encouraged him to increase workload as able. For exercise outside of rehab he says he is outside working with his chicken and is active with his dogs. Takahiro is doing well in rehab. He was able to increase his treadmill workload to a speed of 2. and 1.5% incline. He was also able to increase from level 3 to 5 on the XR. We will continue to monitor his progress in the program. Kolbi is doing well in rehab. He walks a lot at home is active with yard work. Encouraged him to work on increasing workload while he is here at rehab.   Expected Outcomes Short: Use RPE daily to regulate intensity. Long: Follow program prescription in THR. Short: Continue to follow exercise prescription. Long: Continue exercise to improve strength and stamina. STg: Continue to follow exercise prescription and increase workloads as able. Continue exercise to improve strength and stamina. Short: Continue to increase treadmill workload. Long: Continue exercise to improve strength and stamina. STG: Increase workload as able. LTG: Continue exercise to improve strength and stamina.    Row Name 02/10/24 1559 02/22/24 1424           Exercise Goal Re-Evaluation   Exercise Goals Review Increase Physical Activity;Increase Strength and Stamina;Understanding of Exercise Prescription Increase Physical Activity;Increase Strength and Stamina;Understanding of Exercise Prescription      Comments Jalin has only attended one session of rehab since the last review. His speed on  the treadmill decreased down to 2.3 mph. He also decreased to level 2 on the XR. We will continue to monitor his progress in the program. Pascal continues to do well in rehab. He recently increased his treadmill workload by increasing his incline to 3% while maintaining his speed at 2.3 mph. He also improved back up to level 4 on the XR and began using the T5 nustep at level 4. We will continue to monitor his progress in the program.      Expected Outcomes Short: Attend rehab more consistently. Long: Continue exercise to improve strength and stamina. Short: Continue to progressively increase treadmill workload. Long: Continue exercise to improve strength and stamina.               Discharge Exercise Prescription (Final Exercise Prescription Changes):  Exercise Prescription Changes - 02/22/24 1400       Response to Exercise   Blood Pressure (Admit) 122/70    Blood Pressure (Exercise) 124/82    Blood Pressure (Exit) 100/64    Heart Rate (Admit) 62 bpm    Heart Rate (Exercise) 77 bpm    Heart Rate (Exit) 57 bpm    Rating of Perceived Exertion (Exercise) 13    Symptoms none    Duration Continue with 30 min of aerobic exercise without signs/symptoms of physical distress.    Intensity THRR unchanged      Progression   Progression Continue to progress workloads to maintain intensity without  signs/symptoms of physical distress.    Average METs 3.02      Resistance Training   Training Prescription Yes    Weight 4 lb    Reps 10-15      Interval Training   Interval Training No      Treadmill   MPH 2.3    Grade 3    Minutes 15    METs 3.71      REL-XR   Level 4    Minutes 15    METs 3.9      T5 Nustep   Level 4    Minutes 15    METs 2.47      Oxygen   Maintain Oxygen Saturation 88% or higher             Nutrition:  Target Goals: Understanding of nutrition guidelines, daily intake of sodium 1500mg , cholesterol 200mg , calories 30% from fat and 7% or less from  saturated fats, daily to have 5 or more servings of fruits and vegetables.  Education: All About Nutrition: -Group instruction provided by verbal, written material, interactive activities, discussions, models, and posters to present general guidelines for heart healthy nutrition including fat, fiber, MyPlate, the role of sodium in heart healthy nutrition, utilization of the nutrition label, and utilization of this knowledge for meal planning. Follow up email sent as well. Written material given at graduation.   Biometrics:  Pre Biometrics - 12/29/23 1123       Pre Biometrics   Height 5' 9.5" (1.765 m)    Weight 157 lb 11.2 oz (71.5 kg)    Waist Circumference 34 inches    Hip Circumference 36 inches    Waist to Hip Ratio 0.94 %    BMI (Calculated) 22.96    Single Leg Stand 23.7 seconds              Nutrition Therapy Plan and Nutrition Goals:  Nutrition Therapy & Goals - 12/29/23 1346       Nutrition Therapy   Diet Cardiac, Low Na    Protein (specify units) 75-90    Fiber 30 grams    Whole Grain Foods 3 servings    Saturated Fats 15 max. grams    Fruits and Vegetables 5 servings/day    Sodium 2 grams      Personal Nutrition Goals   Nutrition Goal Read labels and reduce sodium intake to below 2300mg . Ideally 1500mg  per day.    Personal Goal #2 Reduce saturated fat, less than 12g per day. Replace bad fats for more heart healthy fats.    Personal Goal #3 Eat 15-30gProtein and 30-60gCarbs at each meal.    Comments Patient drinking ~32oz of water  and ~32oz of regular soda. Spoke with him about drinking sugary and calorie dense beverages. He feels its part of his morning routine and would struggle with energy without it. His A1C is normal. Reviewed mediterranean diet handout. Educated on types of fats, sources and how to read label. Reviewed balanced plates handout. Discussed the importance of including colorful produce at plates. Explained the benefits of pairing carbs with  protein or healthy fats. Brainstormed meal and snacks ideas with focus on variety while keeping sodium and saturated fat intake below the limits provided of 1500mg  and 12g respectively      Intervention Plan   Intervention Prescribe, educate and counsel regarding individualized specific dietary modifications aiming towards targeted core components such as weight, hypertension, lipid management, diabetes, heart failure and other comorbidities.;Nutrition handout(s) given to patient.  Expected Outcomes Long Term Goal: Adherence to prescribed nutrition plan.;Short Term Goal: A plan has been developed with personal nutrition goals set during dietitian appointment.;Short Term Goal: Understand basic principles of dietary content, such as calories, fat, sodium, cholesterol and nutrients.             Nutrition Assessments:  MEDIFICTS Score Key: >=70 Need to make dietary changes  40-70 Heart Healthy Diet <= 40 Therapeutic Level Cholesterol Diet  Flowsheet Row Cardiac Rehab from 12/29/2023 in [facility] Cardiac and Pulmonary Rehab  Picture Your Plate Total Score on Admission 63      Picture Your Plate Scores: <16 Unhealthy dietary pattern with much room for improvement. 41-50 Dietary pattern unlikely to meet recommendations for good health and room for improvement. 51-60 More healthful dietary pattern, with some room for improvement.  >60 Healthy dietary pattern, although there may be some specific behaviors that could be improved.    Nutrition Goals Re-Evaluation:  Nutrition Goals Re-Evaluation     Row Name 01/13/24 0933 02/08/24 0937           Goals   Comment Matyas reports he feels he is doing well with nutrition. He says his portions are small but he includes protein at his meals. Reminded him to read labels and keep sodium controlled, below 1500mg  ideally. Aws says he is doing well with nutrition. Eats most homecooked meals and rarely eats out and tries to stay away from processed foods.       Expected Outcome STG: limit sodium below 1500mg . LTG: Follow a heart healthy diet that supports bodies needs STG: Continue to eat healthy meals at home. LTG: Follow a heart healthy diet that supports bodies needs               Nutrition Goals Discharge (Final Nutrition Goals Re-Evaluation):  Nutrition Goals Re-Evaluation - 02/08/24 0937       Goals   Comment Brown says he is doing well with nutrition. Eats most homecooked meals and rarely eats out and tries to stay away from processed foods.    Expected Outcome STG: Continue to eat healthy meals at home. LTG: Follow a heart healthy diet that supports bodies needs             Psychosocial: Target Goals: Acknowledge presence or absence of significant depression and/or stress, maximize coping skills, provide positive support system. Participant is able to verbalize types and ability to use techniques and skills needed for reducing stress and depression.   Education: Stress, Anxiety, and Depression - Group verbal and visual presentation to define topics covered.  Reviews how body is impacted by stress, anxiety, and depression.  Also discusses healthy ways to reduce stress and to treat/manage anxiety and depression.  Written material given at graduation.   Education: Sleep Hygiene -Provides group verbal and written instruction about how sleep can affect your health.  Define sleep hygiene, discuss sleep cycles and impact of sleep habits. Review good sleep hygiene tips.    Initial Review & Psychosocial Screening:  Initial Psych Review & Screening - 12/23/23 1446       Initial Review   Current issues with None Identified      Family Dynamics   Good Support System? Yes   wife nad brother     Barriers   Psychosocial barriers to participate in program There are no identifiable barriers or psychosocial needs.      Screening Interventions   Interventions Encouraged to exercise;To provide support and resources with identified  psychosocial  needs;Provide feedback about the scores to participant    Expected Outcomes Short Term goal: Utilizing psychosocial counselor, staff and physician to assist with identification of specific Stressors or current issues interfering with healing process. Setting desired goal for each stressor or current issue identified.;Long Term Goal: Stressors or current issues are controlled or eliminated.;Short Term goal: Identification and review with participant of any Quality of Life or Depression concerns found by scoring the questionnaire.;Long Term goal: The participant improves quality of Life and PHQ9 Scores as seen by post scores and/or verbalization of changes             Quality of Life Scores:   Scores of 19 and below usually indicate a poorer quality of life in these areas.  A difference of  2-3 points is a clinically meaningful difference.  A difference of 2-3 points in the total score of the Quality of Life Index has been associated with significant improvement in overall quality of life, self-image, physical symptoms, and general health in studies assessing change in quality of life.  PHQ-9: Review Flowsheet       12/29/2023 05/19/2022  Depression screen PHQ 2/9  Decreased Interest 0 0  Down, Depressed, Hopeless 0 0  PHQ - 2 Score 0 0  Altered sleeping 1 -  Tired, decreased energy 0 -  Change in appetite 0 -  Feeling bad or failure about yourself  0 -  Trouble concentrating 0 -  Moving slowly or fidgety/restless 0 -  Suicidal thoughts 0 -  PHQ-9 Score 1 -  Difficult doing work/chores Not difficult at all -   Interpretation of Total Score  Total Score Depression Severity:  1-4 = Minimal depression, 5-9 = Mild depression, 10-14 = Moderate depression, 15-19 = Moderately severe depression, 20-27 = Severe depression   Psychosocial Evaluation and Intervention:  Psychosocial Evaluation - 12/23/23 1502       Psychosocial Evaluation & Interventions   Interventions Encouraged  to exercise with the program and follow exercise prescription    Comments There are o barriers to attending the program.  HE wishes to continue to heal and get back to his lawn mower, 4 whellers and usual activities.   He lives with his wife and she is his support.   He is ready to start the program.    Expected Outcomes STG attend all scheduled sessions, follow exercise progression guidelines.  LTG continues with exercise progression and nutriton plan to continue to work on heart healthy lifestyle    Continue Psychosocial Services  Follow up required by staff             Psychosocial Re-Evaluation:  Psychosocial Re-Evaluation     Row Name 01/13/24 [phone] 02/08/24 0936           Psychosocial Re-Evaluation   Current issues with None Identified None Identified      Comments Kenneth denies any anxiety, depression or stress. Says he has a good support system and sleeps well. Reports he gets ~7hrs per night and wakes up well rested. Bonnie denies any stress, depression or aniety. He says he has a good support system. Reports he is getting ~7hrs per night and wakes up well rested.      Expected Outcomes STG: Continue to attend cardiac rehab. LTG: achieve and maintain a positive outlook on health and daily life STG: Continue to attend cardiac rehab. LTG: achieve and maintain a positive outlook on health and daily life      Interventions Encouraged to attend Cardiac  Rehabilitation for the exercise Encouraged to attend Cardiac Rehabilitation for the exercise      Continue Psychosocial Services  Follow up required by staff Follow up required by staff               Psychosocial Discharge (Final Psychosocial Re-Evaluation):  Psychosocial Re-Evaluation - 02/08/24 0936       Psychosocial Re-Evaluation   Current issues with None Identified    Comments Trigo denies any stress, depression or aniety. He says he has a good support system. Reports he is getting ~7hrs per night and wakes up well rested.     Expected Outcomes STG: Continue to attend cardiac rehab. LTG: achieve and maintain a positive outlook on health and daily life    Interventions Encouraged to attend Cardiac Rehabilitation for the exercise    Continue Psychosocial Services  Follow up required by staff             Vocational Rehabilitation: Provide vocational rehab assistance to qualifying candidates.   Vocational Rehab Evaluation & Intervention:   Education: Education Goals: Education classes will be provided on a variety of topics geared toward better understanding of heart health and risk factor modification. Participant will state understanding/return demonstration of topics presented as noted by education test scores.  Learning Barriers/Preferences:   General Cardiac Education Topics:  AED/CPR: - Group verbal and written instruction with the use of models to demonstrate the basic use of the AED with the basic ABC's of resuscitation.   Anatomy and Cardiac Procedures: - Group verbal and visual presentation and models provide information about basic cardiac anatomy and function. Reviews the testing methods done to diagnose heart disease and the outcomes of the test results. Describes the treatment choices: Medical Management, Angioplasty, or Coronary Bypass Surgery for treating various heart conditions including Myocardial Infarction, Angina, Valve Disease, and Cardiac Arrhythmias.  Written material given at graduation.   Medication Safety: - Group verbal and visual instruction to review commonly prescribed medications for heart and lung disease. Reviews the medication, class of the drug, and side effects. Includes the steps to properly store meds and maintain the prescription regimen.  Written material given at graduation.   Intimacy: - Group verbal instruction through game format to discuss how heart and lung disease can affect sexual intimacy. Written material given at graduation.. Flowsheet Row Cardiac Rehab  from 02/17/2024 in [facility] Cardiac and Pulmonary Rehab  Date 02/17/24  Educator MB  Instruction Review Code 1- Verbalizes Understanding       Know Your Numbers and Heart Failure: - Group verbal and visual instruction to discuss disease risk factors for cardiac and pulmonary disease and treatment options.  Reviews associated critical values for Overweight/Obesity, Hypertension, Cholesterol, and Diabetes.  Discusses basics of heart failure: signs/symptoms and treatments.  Introduces Heart Failure Zone chart for action plan for heart failure.  Written material given at graduation. Flowsheet Row Cardiac Rehab from 02/17/2024 in [facility] Cardiac and Pulmonary Rehab  Date 01/13/24  Educator SB  Instruction Review Code 1- Verbalizes Understanding       Infection Prevention: - Provides verbal and written material to individual with discussion of infection control including proper hand washing and proper equipment cleaning during exercise session. Flowsheet Row Cardiac Rehab from 02/17/2024 in [facility] Cardiac and Pulmonary Rehab  Date 12/29/23  Educator NT  Instruction Review Code 1- Verbalizes Understanding       Falls Prevention: - Provides verbal and written material to individual with discussion of falls prevention and safety. Flowsheet Row Cardiac  Rehab from 02/17/2024 in [facility] Cardiac and Pulmonary Rehab  Date 12/23/23  Educator SB  Instruction Review Code 1- Verbalizes Understanding       Other: -Provides group and verbal instruction on various topics (see comments)   Knowledge Questionnaire Score:   Core Components/Risk Factors/Patient Goals at Admission:  Personal Goals and Risk Factors at Admission - 12/23/23 1447       Core Components/Risk Factors/Patient Goals on Admission    Weight Management Yes    Intervention Weight Management: Develop a combined nutrition and exercise program designed to reach desired caloric intake, while maintaining appropriate intake of nutrient and  fiber, sodium and fats, and appropriate energy expenditure required for the weight goal.;Weight Management: Provide education and appropriate resources to help participant work on and attain dietary goals.    Admit Weight 154 lb (69.9 kg)    Goal Weight: Short Term 155 lb (70.3 kg)    Goal Weight: Long Term 178 lb (80.7 kg)    Expected Outcomes Short Term: Continue to assess and modify interventions until short term weight is achieved;Long Term: Adherence to nutrition and physical activity/exercise program aimed toward attainment of established weight goal;Weight Gain: Understanding of general recommendations for a high calorie, high protein meal plan that promotes weight gain by distributing calorie intake throughout the day with the consumption for 4-5 meals, snacks, and/or supplements    Hypertension Yes    Intervention Provide education on lifestyle modifcations including regular physical activity/exercise, weight management, moderate sodium restriction and increased consumption of fresh fruit, vegetables, and low fat dairy, alcohol moderation, and smoking cessation.;Monitor prescription use compliance.    Expected Outcomes Short Term: Continued assessment and intervention until BP is < 140/64mm HG in hypertensive participants. < 130/17mm HG in hypertensive participants with diabetes, heart failure or chronic kidney disease.;Long Term: Maintenance of blood pressure at goal levels.    Lipids Yes    Intervention Provide education and support for participant on nutrition & aerobic/resistive exercise along with prescribed medications to achieve LDL 70mg , HDL >40mg .    Expected Outcomes Short Term: Participant states understanding of desired cholesterol values and is compliant with medications prescribed. Participant is following exercise prescription and nutrition guidelines.;Long Term: Cholesterol controlled with medications as prescribed, with individualized exercise RX and with personalized nutrition  plan. Value goals: LDL < 70mg , HDL > 40 mg.             Education:Diabetes - Individual verbal and written instruction to review signs/symptoms of diabetes, desired ranges of glucose level fasting, after meals and with exercise. Acknowledge that pre and post exercise glucose checks will be done for 3 sessions at entry of program.   Core Components/Risk Factors/Patient Goals Review:   Goals and Risk Factor Review     Row Name 01/13/24 0935 02/08/24 0939           Core Components/Risk Factors/Patient Goals Review   Personal Goals Review Hypertension Hypertension      Review Aviv reports he does check his blood pressure at home. Says it consistently matches the readings her at rehab. Encouraged him to continue to check his BP at home and let the rehab team if there are any changes in readings Salahuddin reports he checks his blood pressure at home about once per week. Encouraged him to check a few times per week. More if readings are concerning.      Expected Outcomes STG: Check BP at home. LTG: manage risk factors independently STG: Check BP at home 4-5 times per  week. LTG: manage risk factors independently               Core Components/Risk Factors/Patient Goals at Discharge (Final Review):   Goals and Risk Factor Review - 02/08/24 0939       Core Components/Risk Factors/Patient Goals Review   Personal Goals Review Hypertension    Review Osmond reports he checks his blood pressure at home about once per week. Encouraged him to check a few times per week. More if readings are concerning.    Expected Outcomes STG: Check BP at home 4-5 times per week. LTG: manage risk factors independently             ITP Comments:  ITP Comments     Row Name 12/23/23 1501 12/29/23 1130 01/03/24 0935 01/26/24 1329 02/23/24 0958   ITP Comments Virtual orientation call completed today. he has an appointment on Date: 12/29/2023  for EP eval and gym Orientation.  Documentation of diagnosis can be  found in [facility] 11/08/2023 . Completed and gym orientation for cardiac rehab. Initial ITP created and sent for review to Dr. Firman Hughes, Medical Director. First full day of exercise!  Patient was oriented to gym and equipment including functions, settings, policies, and procedures.  Patient's individual exercise prescription and treatment plan were reviewed.  All starting workloads were established based on the results of the 6 minute walk test done at initial orientation visit.  The plan for exercise progression was also introduced and progression will be customized based on patient's performance and goals. 30 Day review completed. Medical Director ITP review done, changes made as directed, and signed approval by Medical Director.    new to prpgram 30 Day review completed. Medical Director ITP review done, changes made as directed, and signed approval by Medical Director.            Comments:

## 2024-02-24 ENCOUNTER — Encounter: Admitting: *Deleted

## 2024-02-24 DIAGNOSIS — Z951 Presence of aortocoronary bypass graft: Secondary | ICD-10-CM

## 2024-02-24 NOTE — Progress Notes (Signed)
 Daily Session Note  Patient Details  Name: [name] MRN: [id number] Date of Birth: 06-29-1966 Referring Provider:   Flowsheet Row Cardiac Rehab from 12/29/2023 in [facility] Cardiac and Pulmonary Rehab  Referring Provider Dr. Percival Brace, MD       Encounter Date: 02/24/2024  Check In:  Session Check In - 02/24/24 0954       Check-In   Supervising physician immediately available to respond to emergencies See telemetry face sheet for immediately available ER MD    Location [facility]-Cardiac & Pulmonary Rehab    Staff Present Maud Sorenson, RN, BSN, CCRP;Margaret Best, MS, Exercise Physiologist;Maxon Conetta BS, Exercise Physiologist;Joseph Lacinda Pica RCP,RRT,BSRT    Virtual Visit No    Medication changes reported     No    Fall or balance concerns reported    No    Warm-up and Cool-down Performed on first and last piece of equipment    Resistance Training Performed Yes    VAD Patient? No    PAD/SET Patient? No      Pain Assessment   Currently in Pain? No/denies                Social History   Tobacco Use  Smoking Status Former   Current packs/day: 0.00   Average packs/day: 0.3 packs/day for 25.0 years (6.3 ttl pk-yrs)   Types: Cigarettes   Start date: 09/1992   Quit date: 09/2017   Years since quitting: 6.4  Smokeless Tobacco Never    Goals Met:  Independence with exercise equipment Exercise tolerated well No report of concerns or symptoms today  Goals Unmet:  Not Applicable  Comments: Pt able to follow exercise prescription today without complaint.  Will continue to monitor for progression.    Dr. Firman Hughes is Medical Director for [facility] Cardiac Rehabilitation.  Dr. Fuad Aleskerov is Medical Director for [facility] Pulmonary Rehabilitation.

## 2024-02-28 ENCOUNTER — Encounter: Admitting: *Deleted

## 2024-02-28 DIAGNOSIS — Z951 Presence of aortocoronary bypass graft: Secondary | ICD-10-CM

## 2024-02-28 NOTE — Progress Notes (Signed)
 Daily Session Note  Patient Details  Name: [name] MRN: [id number] Date of Birth: May 24, 1966 Referring Provider:   Flowsheet Row Cardiac Rehab from 12/29/2023 in [facility] Cardiac and Pulmonary Rehab  Referring Provider Dr. Percival Brace, MD       Encounter Date: 02/28/2024  Check In:  Session Check In - 02/28/24 0934       Check-In   Supervising physician immediately available to respond to emergencies See telemetry face sheet for immediately available ER MD    Location [facility]-Cardiac & Pulmonary Rehab    Staff Present Maud Sorenson, RN, BSN, CCRP;Maxon Conetta BS, Exercise Physiologist;Kelly Bollinger RN,BSN,MPA;Meredith Craven RN,BSN    Virtual Visit No    Medication changes reported     No    Fall or balance concerns reported    No    Warm-up and Cool-down Performed on first and last piece of equipment    Resistance Training Performed Yes    VAD Patient? No    PAD/SET Patient? No      Pain Assessment   Currently in Pain? No/denies                Social History   Tobacco Use  Smoking Status Former   Current packs/day: 0.00   Average packs/day: 0.3 packs/day for 25.0 years (6.3 ttl pk-yrs)   Types: Cigarettes   Start date: 09/1992   Quit date: 09/2017   Years since quitting: 6.4  Smokeless Tobacco Never    Goals Met:  Independence with exercise equipment Exercise tolerated well No report of concerns or symptoms today  Goals Unmet:  Not Applicable  Comments: Pt able to follow exercise prescription today without complaint.  Will continue to monitor for progression.    Dr. Firman Hughes is Medical Director for [facility] Cardiac Rehabilitation.  Dr. Fuad Aleskerov is Medical Director for [facility] Pulmonary Rehabilitation.

## 2024-02-29 ENCOUNTER — Encounter: Admitting: *Deleted

## 2024-02-29 DIAGNOSIS — Z951 Presence of aortocoronary bypass graft: Secondary | ICD-10-CM | POA: Diagnosis not present

## 2024-02-29 NOTE — Progress Notes (Signed)
 Daily Session Note  Patient Details  Name: [name] MRN: [id number] Date of Birth: April 28, 1966 Referring Provider:   Flowsheet Row Cardiac Rehab from 12/29/2023 in [facility] Cardiac and Pulmonary Rehab  Referring Provider Dr. Percival Brace, MD       Encounter Date: 02/29/2024  Check In:  Session Check In - 02/29/24 0930       Check-In   Supervising physician immediately available to respond to emergencies See telemetry face sheet for immediately available ER MD    Location [facility]-Cardiac & Pulmonary Rehab    Staff Present Maud Sorenson, RN, BSN, CCRP;Noah Tickle, BS, Exercise Physiologist;Kelly Bollinger [facility]    Virtual Visit No    Medication changes reported     No    Fall or balance concerns reported    No    Warm-up and Cool-down Performed on first and last piece of equipment    Resistance Training Performed Yes    VAD Patient? No    PAD/SET Patient? No      Pain Assessment   Currently in Pain? No/denies                Social History   Tobacco Use  Smoking Status Former   Current packs/day: 0.00   Average packs/day: 0.3 packs/day for 25.0 years (6.3 ttl pk-yrs)   Types: Cigarettes   Start date: 09/1992   Quit date: 09/2017   Years since quitting: 6.4  Smokeless Tobacco Never    Goals Met:  Independence with exercise equipment Exercise tolerated well No report of concerns or symptoms today  Goals Unmet:  Not Applicable  Comments: Pt able to follow exercise prescription today without complaint.  Will continue to monitor for progression.    Dr. Firman Hughes is Medical Director for [facility] Cardiac Rehabilitation.  Dr. Fuad Aleskerov is Medical Director for [facility] Pulmonary Rehabilitation.

## 2024-03-02 ENCOUNTER — Encounter: Admitting: *Deleted

## 2024-03-02 DIAGNOSIS — Z951 Presence of aortocoronary bypass graft: Secondary | ICD-10-CM | POA: Diagnosis not present

## 2024-03-02 NOTE — Progress Notes (Signed)
 Daily Session Note  Patient Details  Name: [name] MRN: [id number] Date of Birth: 06/05/1966 Referring Provider:   Flowsheet Row Cardiac Rehab from 12/29/2023 in [facility] Cardiac and Pulmonary Rehab  Referring Provider Dr. Percival Brace, MD    Encounter Date: 03/02/2024  Check In:  Session Check In - 03/02/24 0937       Check-In   Supervising physician immediately available to respond to emergencies See telemetry face sheet for immediately available ER MD    Location [facility]-Cardiac & Pulmonary Rehab    Staff Present Maud Sorenson, RN, BSN, CCRP;Noah Tickle, BS, Exercise Physiologist;Meredith Manson Seitz RN,BSN;Jason Martina Sledge RDN,LDN    Virtual Visit No    Medication changes reported     No    Fall or balance concerns reported    No    Warm-up and Cool-down Performed on first and last piece of equipment    Resistance Training Performed Yes    VAD Patient? No    PAD/SET Patient? No      Pain Assessment   Currently in Pain? No/denies             Social History   Tobacco Use  Smoking Status Former   Current packs/day: 0.00   Average packs/day: 0.3 packs/day for 25.0 years (6.3 ttl pk-yrs)   Types: Cigarettes   Start date: 09/1992   Quit date: 09/2017   Years since quitting: 6.4  Smokeless Tobacco Never    Goals Met:  Independence with exercise equipment Exercise tolerated well No report of concerns or symptoms today  Goals Unmet:  Not Applicable  Comments: Pt able to follow exercise prescription today without complaint.  Will continue to monitor for progression.    Dr. Firman Hughes is Medical Director for [facility] Cardiac Rehabilitation.  Dr. Fuad Aleskerov is Medical Director for [facility] Pulmonary Rehabilitation.

## 2024-03-03 NOTE — Pre-Procedure Instructions (Signed)
 Surgical Instructions   Your procedure is scheduled on March 17, 2024. Report to [facility] Main Entrance A at 5:30 A.M., then check in with the Admitting office. Any questions or running late day of surgery: call [phone]  Questions prior to your surgery date: call [phone], Monday-Friday, 8am-4pm. If you experience any cold or flu symptoms such as cough, fever, chills, shortness of breath, etc. between now and your scheduled surgery, please notify us  at the above number.     Remember:  Do not eat or drink after midnight the night before your surgery    Take these medicines the morning of surgery with A SIP OF WATER : amLODipine  (NORVASC )  aspirin   clopidogrel  (PLAVIX )  loratadine  (CLARITIN )  metoprolol  succinate (TOPROL -XL)    May take these medicines IF NEEDED: acetaminophen  (TYLENOL )    One week prior to surgery, STOP taking any Aleve, Naproxen, Ibuprofen, Motrin, Advil, Goody's, BC's, all herbal medications, fish oil, and non-prescription vitamins.                     Do NOT Smoke (Tobacco/Vaping) for 24 hours prior to your procedure.  If you use a CPAP at night, you may bring your mask/headgear for your overnight stay.   You will be asked to remove any contacts, glasses, piercing's, hearing aid's, dentures/partials prior to surgery. Please bring cases for these items if needed.    Patients discharged the day of surgery will not be allowed to drive home, and someone needs to stay with them for 24 hours.  SURGICAL WAITING ROOM VISITATION Patients may have no more than 2 support people in the waiting area - these visitors may rotate.   Pre-op nurse will coordinate an appropriate time for 1 ADULT support person, who may not rotate, to accompany patient in pre-op.  Children under the age of 58 must have an adult with them who is not the patient and must remain in the main waiting area with an adult.  If the patient needs to stay at the hospital during part of their  recovery, the visitor guidelines for inpatient rooms apply.  Please refer to the [facility] website for the visitor guidelines for any additional information.   If you received a COVID test during your pre-op visit  it is requested that you wear a mask when out in public, stay away from anyone that may not be feeling well and notify your surgeon if you develop symptoms. If you have been in contact with anyone that has tested positive in the last 10 days please notify you surgeon.      Pre-operative CHG Bathing Instructions   You can play a key role in reducing the risk of infection after surgery. Your skin needs to be as free of germs as possible. You can reduce the number of germs on your skin by washing with CHG (chlorhexidine  gluconate) soap before surgery. CHG is an antiseptic soap that kills germs and continues to kill germs even after washing.   DO NOT use if you have an allergy to chlorhexidine /CHG or antibacterial soaps. If your skin becomes reddened or irritated, stop using the CHG and notify one of our RNs at [phone].              TAKE A SHOWER THE NIGHT BEFORE SURGERY AND THE DAY OF SURGERY    Please keep in mind the following:  DO NOT shave, including legs and underarms, 48 hours prior to surgery.   You may shave your  face before/day of surgery.  Place clean sheets on your bed the night before surgery Use a clean washcloth (not used since being washed) for each shower. DO NOT sleep with pet's night before surgery.  CHG Shower Instructions:  Wash your face and private area with normal soap. If you choose to wash your hair, wash first with your normal shampoo.  After you use shampoo/soap, rinse your hair and body thoroughly to remove shampoo/soap residue.  Turn the water  OFF and apply half the bottle of CHG soap to a CLEAN washcloth.  Apply CHG soap ONLY FROM YOUR NECK DOWN TO YOUR TOES (washing for 3-5 minutes)  DO NOT use CHG soap on face, private areas, open wounds,  or sores.  Pay special attention to the area where your surgery is being performed.  If you are having back surgery, having someone wash your back for you may be helpful. Wait 2 minutes after CHG soap is applied, then you may rinse off the CHG soap.  Pat dry with a clean towel  Put on clean pajamas    Additional instructions for the day of surgery: DO NOT APPLY any lotions, deodorants, cologne, or perfumes.   Do not wear jewelry or makeup Do not wear nail polish, gel polish, artificial nails, or any other type of covering on natural nails (fingers and toes) Do not bring valuables to the hospital. [facility] is not responsible for valuables/personal belongings. Put on clean/comfortable clothes.  Please brush your teeth.  Ask your nurse before applying any prescription medications to the skin.

## 2024-03-06 ENCOUNTER — Encounter (HOSPITAL_COMMUNITY)
Admission: RE | Admit: 2024-03-06 | Discharge: 2024-03-06 | Disposition: A | Discharge status: Home / Self Care | Source: Ambulatory Visit | Attending: Vascular Surgery | Admitting: Vascular Surgery

## 2024-03-06 ENCOUNTER — Encounter: Admitting: *Deleted

## 2024-03-06 ENCOUNTER — Encounter (HOSPITAL_COMMUNITY): Payer: Self-pay

## 2024-03-06 ENCOUNTER — Other Ambulatory Visit: Payer: Self-pay

## 2024-03-06 VITALS — BP 131/71 | HR 59 | Temp 98.3°F | Resp 17 | Ht 69.0 in | Wt 154.4 lb

## 2024-03-06 DIAGNOSIS — I10 Essential (primary) hypertension: Secondary | ICD-10-CM | POA: Insufficient documentation

## 2024-03-06 DIAGNOSIS — Z9889 Other specified postprocedural states: Secondary | ICD-10-CM | POA: Insufficient documentation

## 2024-03-06 DIAGNOSIS — I480 Paroxysmal atrial fibrillation: Secondary | ICD-10-CM | POA: Insufficient documentation

## 2024-03-06 DIAGNOSIS — Z951 Presence of aortocoronary bypass graft: Secondary | ICD-10-CM | POA: Diagnosis not present

## 2024-03-06 DIAGNOSIS — I493 Ventricular premature depolarization: Secondary | ICD-10-CM | POA: Diagnosis not present

## 2024-03-06 DIAGNOSIS — Z7902 Long term (current) use of antithrombotics/antiplatelets: Secondary | ICD-10-CM | POA: Diagnosis not present

## 2024-03-06 DIAGNOSIS — I2510 Atherosclerotic heart disease of native coronary artery without angina pectoris: Secondary | ICD-10-CM | POA: Insufficient documentation

## 2024-03-06 DIAGNOSIS — Z01818 Encounter for other preprocedural examination: Secondary | ICD-10-CM | POA: Diagnosis present

## 2024-03-06 DIAGNOSIS — Z01812 Encounter for preprocedural laboratory examination: Secondary | ICD-10-CM | POA: Diagnosis not present

## 2024-03-06 DIAGNOSIS — Z79899 Other long term (current) drug therapy: Secondary | ICD-10-CM | POA: Insufficient documentation

## 2024-03-06 DIAGNOSIS — Z87891 Personal history of nicotine dependence: Secondary | ICD-10-CM | POA: Insufficient documentation

## 2024-03-06 DIAGNOSIS — E785 Hyperlipidemia, unspecified: Secondary | ICD-10-CM | POA: Diagnosis not present

## 2024-03-06 DIAGNOSIS — I6522 Occlusion and stenosis of left carotid artery: Secondary | ICD-10-CM | POA: Diagnosis not present

## 2024-03-06 DIAGNOSIS — Z955 Presence of coronary angioplasty implant and graft: Secondary | ICD-10-CM | POA: Diagnosis not present

## 2024-03-06 DIAGNOSIS — F419 Anxiety disorder, unspecified: Secondary | ICD-10-CM | POA: Diagnosis not present

## 2024-03-06 DIAGNOSIS — I6523 Occlusion and stenosis of bilateral carotid arteries: Secondary | ICD-10-CM

## 2024-03-06 LAB — PROTIME-INR
INR: 1.1 (ref 0.8–1.2)
Prothrombin Time: 14.1 s (ref 11.4–15.2)

## 2024-03-06 LAB — URINALYSIS, ROUTINE W REFLEX MICROSCOPIC
Bilirubin Urine: NEGATIVE
Glucose, UA: NEGATIVE mg/dL
Hgb urine dipstick: NEGATIVE
Ketones, ur: NEGATIVE mg/dL
Leukocytes,Ua: NEGATIVE
Nitrite: NEGATIVE
Protein, ur: NEGATIVE mg/dL
Specific Gravity, Urine: 1.017 (ref 1.005–1.030)
pH: 5 (ref 5.0–8.0)

## 2024-03-06 LAB — TYPE AND SCREEN
ABO/RH(D): O POS
Antibody Screen: NEGATIVE

## 2024-03-06 LAB — COMPREHENSIVE METABOLIC PANEL WITH GFR
ALT: 18 U/L (ref 0–44)
AST: 22 U/L (ref 15–41)
Albumin: 4.3 g/dL (ref 3.5–5.0)
Alkaline Phosphatase: 112 U/L (ref 38–126)
Anion gap: 11 (ref 5–15)
BUN: 10 mg/dL (ref 6–20)
CO2: 26 mmol/L (ref 22–32)
Calcium: 9.4 mg/dL (ref 8.9–10.3)
Chloride: 104 mmol/L (ref 98–111)
Creatinine, Ser: 0.89 mg/dL (ref 0.61–1.24)
GFR, Estimated: >60 mL/min (ref >60)
Glucose, Bld: 92 mg/dL (ref 70–99)
Potassium: 3.5 mmol/L (ref 3.5–5.1)
Sodium: 141 mmol/L (ref 135–145)
Total Bilirubin: 1.3 mg/dL — ABNORMAL HIGH (ref 0.0–1.2)
Total Protein: 7.9 g/dL (ref 6.5–8.1)

## 2024-03-06 LAB — SURGICAL PCR SCREEN
MRSA, PCR: NEGATIVE
Staphylococcus aureus: POSITIVE — AB

## 2024-03-06 LAB — CBC
HCT: 48.2 % (ref 39.0–52.0)
Hemoglobin: 15.8 g/dL (ref 13.0–17.0)
MCH: 28.9 pg (ref 26.0–34.0)
MCHC: 32.8 g/dL (ref 30.0–36.0)
MCV: 88.3 fL (ref 80.0–100.0)
Platelets: 197 10*3/uL (ref 150–400)
RBC: 5.46 MIL/uL (ref 4.22–5.81)
RDW: 16.1 % — ABNORMAL HIGH (ref 11.5–15.5)
WBC: 7.6 10*3/uL (ref 4.0–10.5)
nRBC: 0 % (ref 0.0–0.2)

## 2024-03-06 LAB — APTT: aPTT: 31 s (ref 24–36)

## 2024-03-06 NOTE — Progress Notes (Signed)
 Anesthesia Chart Review:  Case: [id number] Date/Time: 03/17/24 0715   Procedure: ENDARTERECTOMY, CAROTID (Left)   Anesthesia type: General   Diagnosis: Stenosis of left carotid artery [I65.22]   Pre-op diagnosis: left carotid stenosis   [facility] / [facility]   Surgeons: Kayla Part, MD       DISCUSSION: Patient is a 58 year old male scheduled for the above procedure. S/p right carotid endarterectomy 01/25/24 with overall unremarkable post-operative course other than holding b-blocker therapy for a few days due to HR in the 50's.   History includes former smoker, HTN, HLD, CAD (overlapping DES mRCA 12/19/09; DES RCA for ISR 02/24/17; DES pLCX 11/11/18; CABG: LIMA-LAD, SVG-PDA, SVG-OM1 11/08/23), post-CABG PAF (discharged in SR on amiodarone  11/12/23), PVCs, carotid artery stenosis (s/p right carotid endarterectomy 01/25/24), thrombocytopenia (PLT count normal 6/16/01/21/24), anxiety, spinal surgery (left L4-5 microdiscectomy 01/06/06, redo 05/09/08). Alcohol use is documented as 2 beers/day.   Last cardiology visit with Dr. Parks Bollman was on 11/29/23 for follow-up CABG. He reportedly was doing well. EKG was not done but HR RRR by exam (no longer on amiodarone ). Meds continued. 3 month follow-up planned. Patient indicated he has routine follow-up on 03/07/24. As needed CT surgery follow-up at 12/22/23 visit.   [name]  Preoperative labs noted. Total bilirubin 1.3 with normal AS and ALT. Isolated elevated total bilirubin noted on labs dating back to 05/27/22. CBC was normal other than elevated RDW of 16.1. A1c 4.7% on 11/04/23.   He is to continue ASA and Plavix  per VVS.   Will leave chart to review upcoming follow-up routine cardiology visit. (UPDATE 03/08/24 9:01 AM: Reviewed 03/07/24 cardiology follow-up with Dr. Parks Bollman. Patient was doing well without exertional chest pain, SOB, palpitations, edema. He was active and walking most days. HTN controlled. He noted plans  for left CEA. No new orders. 3 month follow-up planned.    VS: BP 131/71   Pulse (!) 59   Temp 36.8 C   Resp 17   Ht 5' 9 (1.753 m)   Wt 70 kg   SpO2 99%   BMI 22.80 kg/m   PROVIDERS: Lenell Query, PA-C is PCP. Last primary care visit 01/10/24. Percival Brace, MD is cardiologist Starleen Eastern, MD is CT surgeon. Last visit 12/22/23 with APP. Left pleural effusion decreased with diuresis. As needed TCTS follow-up planned.    LABS: Labs reviewed: Acceptable for surgery. (all labs ordered are listed, but only abnormal results are displayed)  Labs Reviewed  SURGICAL PCR SCREEN - Abnormal; Notable for the following components:      Result Value   Staphylococcus aureus POSITIVE (*)    All other components within normal limits  CBC - Abnormal; Notable for the following components:   RDW 16.1 (*)    All other components within normal limits  COMPREHENSIVE METABOLIC PANEL WITH GFR - Abnormal; Notable for the following components:   Total Bilirubin 1.3 (*)    All other components within normal limits  PROTIME-INR  APTT  URINALYSIS, ROUTINE W REFLEX MICROSCOPIC  TYPE AND SCREEN     IMAGES: CTA Head/neck 12/28/23: IMPRESSION: 1. Critical (95%) stenosis of the proximal right internal carotid artery secondary to calcified and noncalcified atherosclerosis. 2. Severe (70%) stenosis of the proximal left internal carotid artery secondary to calcified and noncalcified atherosclerosis. 3. Partially visualized left pleural effusion. - S/p left CEA 01/25/24.  CXR 12/22/23 (ordered by CT surgeon Dr. Deloise Ferries): FINDINGS: No cardiomegaly. Sternotomy wires and changes of  a prior CABG. Triangular opacity in the left lung base on the frontal radiograph. No lobar consolidation or pneumothorax. Multilevel thoracic osteophytosis. IMPRESSION: Triangular opacity in the left lung base on the frontal radiograph, may reflect changes of a small left pleural effusion,  partially loculated versus subsegmental or lobar atelectasis. Nonemergent chest CT with IV contrast is recommended for further characterization.   MRI Abd 07/03/22: IMPRESSION: 1. No pancreatic ductal dilation or suspicious pancreatic lesion identified, specifically no mass visualized in the pancreatic head correspond with findings on prior CT. Subcentimeter left adrenal nodule is statistically likely to reflect a benign adenoma and requires no independent imaging follow-up.     EKG:  EKG 11/10/23: Atrial fibrillation with rapid ventricular response @ 107 bpm Moderate voltage criteria for LVH, may be normal variant ( Sokolow-Lyon , Cornell product ) Confirmed by Harvie Liner [phone]) on 11/10/2023 5:59:13 PM   EKG 11/08/23 (post-CABG): Sinus rhythm with 1st degree A-V block T wave abnormality, consider inferior ischemia Confirmed by Harvie Liner [phone]) on 11/08/2023 9:14:05 PM   EKG 10/27/23 (pre-CABG) Sinus rhythm Probable left ventricular hypertrophy Borderline T abnormalities, lateral leads Confirmed by UNCONFIRMED, DOCTOR ([id number]), editor Toy Freund, Tammy [phone]) on 10/27/2023 9:30:50 AM     CV: US  Carotid 01/06/24: Summary:  - Right Carotid: Velocities in the right ICA are consistent with a 80-99% stenosis.  - Left Carotid: Velocities in the left ICA are consistent with a 80-99% stenosis.  - Vertebrals: Bilateral vertebral arteries demonstrate antegrade flow.  - Subclavians: Normal flow hemodynamics were seen in bilateral subclavian arteries.  - S/p left CEA 01/25/24.  TEE (intr-op CABG) 11/08/23: POST-OP IMPRESSIONS  Overall, there were no significant changes from pre-bypass.  PRE-OP FINDINGS   Left Ventricle: The left ventricle has normal systolic function, with an  ejection fraction of 60-65%. The cavity size was normal. There is no  increase in left ventricular wall thickness. There is no left ventricular  hypertrophy.    Echo 10/27/23: IMPRESSIONS   1. Left  ventricular ejection fraction, by estimation, is 55 to 60%. The  left ventricle has normal function. The left ventricle has no regional  wall motion abnormalities. There is moderate asymmetric left ventricular hypertrophy of the basal-septal and  septal segments. Left ventricular diastolic parameters are consistent with Grade I diastolic dysfunction (impaired relaxation).   2. Right ventricular systolic function is normal. The right ventricular  size is normal.   3. The mitral valve is normal in structure. No evidence of mitral valve regurgitation.   4. The aortic valve is normal in structure. Aortic valve regurgitation is mild. Aortic valve sclerosis is present, with no evidence of aortic valve stenosis.    Last cardiac cath was on 10/27/23 (pre-CABG).   Past Medical History:  Diagnosis Date   CAD (coronary artery disease)    CABG x4 February 2025 by [facility]   Carotid artery occlusion    Dysrhythmia    post-CABG PAF   HLD (hyperlipidemia)    Hypertension    PVC (premature ventricular contraction)    Thrombocytopenia (HCC)     Past Surgical History:  Procedure Laterality Date   COLONOSCOPY WITH PROPOFOL  N/A 12/12/2015   Procedure: COLONOSCOPY WITH PROPOFOL ;  Surgeon: Cassie Click, MD;  Location[facility] ENDOSCOPY;  Service: Endoscopy;  Laterality: N/A;   COLONOSCOPY WITH PROPOFOL  N/A 06/23/2022   Procedure: COLONOSCOPY WITH PROPOFOL ;  Surgeon: Shane Darling, MD;  Location: [facility] ENDOSCOPY;  Service: Endoscopy;  Laterality: N/A;   CORONARY ARTERY BYPASS GRAFT N/A 11/08/2023  Procedure: CORONARY ARTERY BYPASS GRAFTING (CABG) TIMES THREE USING LEFT INTERNAL MAMMARY ARTERY AND ENDOSCOPICALLY HARVESTED RIGHT GREATER SAPHENOUS VEIN;  Surgeon: Hilarie Lovely, MD;  Location[facility] OR;  Service: Open Heart Surgery;  Laterality: N/A;   CORONARY STENT INTERVENTION N/A 02/24/2017   Procedure: Coronary Stent Intervention;  Surgeon: Percival Brace, MD;  Location: [facility] INVASIVE CV LAB;   Service: Cardiovascular;  Laterality: N/A;   CORONARY STENT INTERVENTION N/A 11/11/2018   Procedure: CORONARY STENT INTERVENTION;  Surgeon: Antonette Batters, MD;  Location: [facility] INVASIVE CV LAB;  Service: Cardiovascular;  Laterality: N/A;   ENDARTERECTOMY Right 01/25/2024   Procedure: ENDARTERECTOMY, CAROTID; WITH PATCH ANGIOPLASTY;  Surgeon: Kayla Part, MD;  Location: [facility] OR;  Service: Vascular;  Laterality: Right;   ESOPHAGOGASTRODUODENOSCOPY (EGD) WITH PROPOFOL  N/A 06/23/2022   Procedure: ESOPHAGOGASTRODUODENOSCOPY (EGD) WITH PROPOFOL ;  Surgeon: Shane Darling, MD;  Location: [facility] ENDOSCOPY;  Service: Endoscopy;  Laterality: N/A;   LEFT HEART CATH AND CORONARY ANGIOGRAPHY Left 02/24/2017   Procedure: Left Heart Cath and Coronary Angiography;  Surgeon: Percival Brace, MD;  Location: [facility] INVASIVE CV LAB;  Service: Cardiovascular;  Laterality: Left;   LEFT HEART CATH AND CORONARY ANGIOGRAPHY N/A 11/11/2018   Procedure: LEFT HEART CATH AND CORONARY ANGIOGRAPHY with possible pci and stent;  Surgeon: Antonette Batters, MD;  Location: [facility] INVASIVE CV LAB;  Service: Cardiovascular;  Laterality: N/A;   LEFT HEART CATH AND CORONARY ANGIOGRAPHY Left 10/27/2023   Procedure: LEFT HEART CATH AND CORONARY ANGIOGRAPHY;  Surgeon: Percival Brace, MD;  Location: [facility] INVASIVE CV LAB;  Service: Cardiovascular;  Laterality: Left;   stents     TEE WITHOUT CARDIOVERSION N/A 11/08/2023   Procedure: TRANSESOPHAGEAL ECHOCARDIOGRAM (TEE);  Surgeon: Hilarie Lovely, MD;  Location: [facility] OR;  Service: Open Heart Surgery;  Laterality: N/A;    MEDICATIONS:  acetaminophen  (TYLENOL ) 325 MG tablet   amiodarone  (PACERONE ) 200 MG tablet   amLODipine  (NORVASC ) 10 MG tablet   aspirin  81 MG tablet   clopidogrel  (PLAVIX ) 75 MG tablet   Coenzyme Q10 100 MG TABS   furosemide  (LASIX ) 40 MG tablet   loratadine  (CLARITIN ) 10 MG tablet   metoprolol  succinate (TOPROL -XL) 100 MG 24 hr tablet   Omega-3 Fatty Acids  (FISH OIL PO)   oxyCODONE -acetaminophen  (PERCOCET) 5-325 MG tablet   pravastatin  (PRAVACHOL ) 40 MG tablet   No current facility-administered medications for this encounter.    Ella Gun, PA-C Surgical Short Stay/Anesthesiology [facility] Phone [phone] [facility] Phone [phone] 03/06/2024 6:12 PM

## 2024-03-06 NOTE — Progress Notes (Signed)
 Daily Session Note  Patient Details  Name: [name] MRN: [id number] Date of Birth: 1966-03-15 Referring Provider:   Flowsheet Row Cardiac Rehab from 12/29/2023 in [facility] Cardiac and Pulmonary Rehab  Referring Provider Dr. Percival Brace, MD    Encounter Date: 03/06/2024  Check In:  Session Check In - 03/06/24 0918       Check-In   Supervising physician immediately available to respond to emergencies See telemetry face sheet for immediately available ER MD    Location [facility]-Cardiac & Pulmonary Rehab    Staff Present Maud Sorenson, RN, BSN, CCRP;Kelly Hayes BS, ACSM CEP, Exercise Physiologist;Maxon Conetta BS, Exercise Physiologist;Joseph Hood RCP,RRT,BSRT    Virtual Visit No    Medication changes reported     No    Fall or balance concerns reported    No    Warm-up and Cool-down Performed on first and last piece of equipment    Resistance Training Performed Yes    VAD Patient? No    PAD/SET Patient? No      Pain Assessment   Currently in Pain? No/denies             Social History   Tobacco Use  Smoking Status Former   Current packs/day: 0.00   Average packs/day: 0.3 packs/day for 25.0 years (6.3 ttl pk-yrs)   Types: Cigarettes   Start date: 09/1992   Quit date: 09/2017   Years since quitting: 6.4  Smokeless Tobacco Never    Goals Met:  Independence with exercise equipment Exercise tolerated well No report of concerns or symptoms today  Goals Unmet:  Not Applicable  Comments: Pt able to follow exercise prescription today without complaint.  Will continue to monitor for progression.    Dr. Firman Hughes is Medical Director for [facility] Cardiac Rehabilitation.  Dr. Fuad Aleskerov is Medical Director for [facility] Pulmonary Rehabilitation.

## 2024-03-06 NOTE — Progress Notes (Signed)
 PCP - Dr. Bruce Caper Cardiologist - Dr. Percival Brace - Last office visit 11/29/2023 with next visit tomorrow, June 17th  PPM/ICD - Denies Device Orders - n/a Rep Notified - n/a  Chest x-ray - 12/22/2023 EKG - 11/10/2023 Stress Test - 07/12/2023 ECHO - 11/08/2023 Cardiac Cath - 10/27/2023  Sleep Study - Denies CPAP - n/a  No DM  Last dose of GLP1 agonist- n/a GLP1 instructions: n/a  Blood Thinner Instructions: Pt instructed to continue Plavix  through the morning of surgery Aspirin  Instructions: Pt instructed to continue ASA through the morning of surgery  NPO after midnight  COVID TEST- n/a   Anesthesia review: Yes. Hx of CAD with CABG x4 Feb. 2025, carotid stenosis and a.fib after CABG.    Patient denies shortness of breath, fever, cough and chest pain at PAT appointment. Pt denies any respiratory illness/infection in the last two months.   All instructions explained to the patient, with a verbal understanding of the material. Patient agrees to go over the instructions while at home for a better understanding. Patient also instructed to self quarantine after being tested for COVID-19. The opportunity to ask questions was provided.

## 2024-03-07 ENCOUNTER — Encounter: Admitting: *Deleted

## 2024-03-07 DIAGNOSIS — Z951 Presence of aortocoronary bypass graft: Secondary | ICD-10-CM

## 2024-03-07 NOTE — Progress Notes (Signed)
 Daily Session Note  Patient Details  Name: [name] MRN: [id number] Date of Birth: 05/29/1966 Referring Provider:   Flowsheet Row Cardiac Rehab from 12/29/2023 in [facility] Cardiac and Pulmonary Rehab  Referring Provider Dr. Percival Brace, MD    Encounter Date: 03/07/2024  Check In:  Session Check In - 03/07/24 0934       Check-In   Supervising physician immediately available to respond to emergencies See telemetry face sheet for immediately available ER MD    Location [facility]-Cardiac & Pulmonary Rehab    Staff Present Maud Sorenson, RN, BSN, CCRP;Maxon Conetta BS, Exercise Physiologist;Noah Tickle, BS, Exercise Physiologist;Jason Martina Sledge RDN,LDN    Virtual Visit No    Medication changes reported     No    Fall or balance concerns reported    No    Warm-up and Cool-down Performed on first and last piece of equipment    Resistance Training Performed Yes    VAD Patient? No    PAD/SET Patient? No      Pain Assessment   Currently in Pain? No/denies             Social History   Tobacco Use  Smoking Status Former   Current packs/day: 0.00   Average packs/day: 0.3 packs/day for 25.0 years (6.3 ttl pk-yrs)   Types: Cigarettes   Start date: 09/1992   Quit date: 09/2017   Years since quitting: 6.4  Smokeless Tobacco Never    Goals Met:  Independence with exercise equipment Exercise tolerated well No report of concerns or symptoms today  Goals Unmet:  Not Applicable  Comments: Pt able to follow exercise prescription today without complaint.  Will continue to monitor for progression.    Dr. Firman Hughes is Medical Director for [facility] Cardiac Rehabilitation.  Dr. Fuad Aleskerov is Medical Director for [facility] Pulmonary Rehabilitation.

## 2024-03-08 NOTE — Anesthesia Preprocedure Evaluation (Addendum)
 Anesthesia Evaluation  Patient identified by MRN, date of birth, ID band Patient awake    Reviewed: Allergy & Precautions, NPO status , Patient's Chart, lab work & pertinent test results  Airway Mallampati: II  TM Distance: >3 FB Neck ROM: Full    Dental no notable dental hx.    Pulmonary former smoker   Pulmonary exam normal        Cardiovascular hypertension, Pt. on medications and Pt. on home beta blockers + CAD, + Cardiac Stents and + CABG  + dysrhythmias  Rhythm:Regular Rate:Normal     Neuro/Psych  Headaches  Anxiety     Carotid stenosis     GI/Hepatic negative GI ROS, Neg liver ROS,,,  Endo/Other  negative endocrine ROS    Renal/GU negative Renal ROS  negative genitourinary   Musculoskeletal negative musculoskeletal ROS (+)    Abdominal Normal abdominal exam  (+)   Peds  Hematology  (+) Blood dyscrasia, anemia Lab Results      Component                Value               Date                      WBC                      7.6                 03/06/2024                HGB                      15.8                03/06/2024                HCT                      48.2                03/06/2024                MCV                      88.3                03/06/2024                PLT                      197                 03/06/2024             Lab Results      Component                Value               Date                      NA                       141                 03/06/2024  K                        3.5                 03/06/2024                CO2                      26                  03/06/2024                GLUCOSE                  92                  03/06/2024                BUN                      10                  03/06/2024                CREATININE               0.89                03/06/2024                CALCIUM                   9.4                 03/06/2024                 EGFR                     100                 05/27/2022                GFRNONAA                 >60                 03/06/2024              Anesthesia Other Findings   Reproductive/Obstetrics                              Anesthesia Physical Anesthesia Plan  ASA: 3  Anesthesia Plan: General   Post-op Pain Management:    Induction: Intravenous  PONV Risk Score and Plan: 2 and Ondansetron , Dexamethasone , Treatment may vary due to age or medical condition and Midazolam   Airway Management Planned: Mask and Oral ETT  Additional Equipment: Arterial line  Intra-op Plan:   Post-operative Plan: Extubation in OR  Informed Consent: I have reviewed the patients History and Physical, chart, labs and discussed the procedure including the risks, benefits and alternatives for the proposed anesthesia with the patient or authorized representative who has indicated his/her understanding and acceptance.     Dental advisory given  Plan Discussed with: CRNA  Anesthesia Plan Comments: (PAT note written 03/08/2024 by Allison Zelenak, PA-C.  )        Anesthesia Quick Evaluation

## 2024-03-09 ENCOUNTER — Encounter: Admitting: *Deleted

## 2024-03-09 DIAGNOSIS — Z951 Presence of aortocoronary bypass graft: Secondary | ICD-10-CM

## 2024-03-09 NOTE — Progress Notes (Signed)
 Daily Session Note  Patient Details  Name: [name] MRN: [id number] Date of Birth: August 28, 1966 Referring Provider:   Flowsheet Row Cardiac Rehab from 12/29/2023 in [facility]  Referring Provider Dr. Percival Brace, MD    Encounter Date: 03/09/2024  Check In:  Session Check In - 03/09/24 0951       Check-In   Supervising physician immediately available to respond to emergencies See telemetry face sheet for immediately available ER MD    Location [facility]-Cardiac & Pulmonary Rehab    Staff Present Maud Sorenson, RN, BSN, CCRP;Joseph Hood RCP,RRT,BSRT;Maxon Petersburg BS, Exercise Physiologist;Krista Spencer RN,BSN    Virtual Visit No    Medication changes reported     No    Warm-up and Cool-down Performed on first and last piece of equipment    Resistance Training Performed Yes    VAD Patient? No    PAD/SET Patient? No      Pain Assessment   Currently in Pain? No/denies             Social History   Tobacco Use  Smoking Status Former   Current packs/day: 0.00   Average packs/day: 0.3 packs/day for 25.0 years (6.3 ttl pk-yrs)   Types: Cigarettes   Start date: 09/1992   Quit date: 09/2017   Years since quitting: 6.4  Smokeless Tobacco Never    Goals Met:  Independence with exercise equipment Exercise tolerated well No report of concerns or symptoms today  Goals Unmet:  Not Applicable  Comments: Pt able to follow exercise prescription today without complaint.  Will continue to monitor for progression.    Dr. Firman Hughes is Medical Director for [facility] Cardiac Rehabilitation.  Dr. Fuad Aleskerov is Medical Director for [facility] Pulmonary Rehabilitation.

## 2024-03-13 ENCOUNTER — Encounter: Admitting: *Deleted

## 2024-03-13 VITALS — Ht 69.5 in | Wt 155.1 lb

## 2024-03-13 DIAGNOSIS — Z951 Presence of aortocoronary bypass graft: Secondary | ICD-10-CM

## 2024-03-13 NOTE — Progress Notes (Signed)
 Daily Session Note  Patient Details  Name: [name] MRN: [id number] Date of Birth: 06-11-1966 Referring Provider:   Flowsheet Row Cardiac Rehab from 12/29/2023 in [facility] Cardiac and Pulmonary Rehab  Referring Provider Dr. Marsa Dooms, MD    Encounter Date: 03/13/2024  Check In:  Session Check In - 03/13/24 0932       Check-In   Supervising physician immediately available to respond to emergencies See telemetry face sheet for immediately available ER MD    Location [facility]-Cardiac & Pulmonary Rehab    Staff Present Othel Durand, RN, BSN, CCRP;Kelly Dyane BS, ACSM CEP, Exercise Physiologist;Kelly Bollinger [facility]    Virtual Visit No    Medication changes reported     No    Fall or balance concerns reported    No    Warm-up and Cool-down Performed on first and last piece of equipment    Resistance Training Performed Yes    VAD Patient? No    PAD/SET Patient? No      Pain Assessment   Currently in Pain? No/denies           6 Minute Walk     Row Name 12/29/23 1120 03/13/24 0926       6 Minute Walk   Phase Initial Discharge    Distance 1330 feet 1420 feet    Distance % Change -- 7 %    Distance Feet Change -- 90 ft    Walk Time 6 minutes 6 minutes    # of Rest Breaks 0 0    MPH 2.52 2.69    METS 3.49 3.73    RPE 9 11    Perceived Dyspnea  1 1    VO2 Peak 12.22 13.05    Symptoms No No    Resting HR 52 bpm 65 bpm    Resting BP 124/58 122/82    Resting Oxygen Saturation  99 % --    Exercise Oxygen Saturation  during 6 min walk 99 % --    Max Ex. HR 62 bpm 68 bpm    Max Ex. BP 128/60 128/82    2 Minute Post BP 126/64 120/80         Social History   Tobacco Use  Smoking Status Former   Current packs/day: 0.00   Average packs/day: 0.3 packs/day for 25.0 years (6.3 ttl pk-yrs)   Types: Cigarettes   Start date: 09/1992   Quit date: 09/2017   Years since quitting: 6.4  Smokeless Tobacco Never    Goals Met:  Independence with exercise  equipment Exercise tolerated well No report of concerns or symptoms today  Goals Unmet:  Not Applicable  Comments: Pt able to follow exercise prescription today without complaint.  Will continue to monitor for progression.    Dr. Oneil Pinal is Medical Director for [facility] Cardiac Rehabilitation.  Dr. Fuad Aleskerov is Medical Director for [facility] Pulmonary Rehabilitation.

## 2024-03-13 NOTE — Patient Instructions (Signed)
 Discharge Patient Instructions  Patient Details  Name: [name] MRN: [id number] Date of Birth: 04-09-1966 Referring Provider:  Cyrus Selinda Moose,*   Number of Visits: 36  Reason for Discharge:  Patient reached a stable level of exercise. Patient independent in their exercise. Patient has met program and personal goals.  Smoking History:  Social History   Tobacco Use  Smoking Status Former   Current packs/day: 0.00   Average packs/day: 0.3 packs/day for 25.0 years (6.3 ttl pk-yrs)   Types: Cigarettes   Start date: 09/1992   Quit date: 09/2017   Years since quitting: 6.4  Smokeless Tobacco Never    Diagnosis:  No diagnosis found.  Initial Exercise Prescription:  Initial Exercise Prescription - 12/29/23 1100       Date of Initial Exercise RX and Referring Provider   Date 12/29/23    Referring Provider Dr. Marsa Dooms, MD      Oxygen   Maintain Oxygen Saturation 88% or higher      Treadmill   MPH 2.6    Grade 1    Minutes 15    METs 3.35      NuStep   Level 3   T6 nustep   SPM 80    Minutes 15    METs 3.49      REL-XR   Level 3    Speed 50    Minutes 15    METs 3.49      Prescription Details   Frequency (times per week) 3    Duration Progress to 30 minutes of continuous aerobic without signs/symptoms of physical distress      Intensity   THRR 40-80% of Max Heartrate 95-139    Ratings of Perceived Exertion 11-13    Perceived Dyspnea 0-4      Progression   Progression Continue to progress workloads to maintain intensity without signs/symptoms of physical distress.      Resistance Training   Training Prescription Yes    Weight 4 lb    Reps 10-15          Discharge Exercise Prescription (Final Exercise Prescription Changes):  Exercise Prescription Changes - 03/09/24 1000       Home Exercise Plan   Plans to continue exercise at Home (comment)   [name] plans to continue to walk outside around his house. 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   Frequency Add 2 additional days to program exercise sessions.    Initial Home Exercises Provided 03/09/24          Functional Capacity:  6 Minute Walk     Row Name 12/29/23 1120 03/13/24 0926       6 Minute Walk   Phase Initial Discharge    Distance 1330 feet 1420 feet    Distance % Change -- 7 %    Distance Feet Change -- 90 ft    Walk Time 6 minutes 6 minutes    # of Rest Breaks 0 0    MPH 2.52 2.69    METS 3.49 3.73    RPE 9 11    Perceived Dyspnea  1 1    VO2 Peak 12.22 13.05    Symptoms No No    Resting HR 52 bpm 65 bpm    Resting BP 124/58 122/82    Resting Oxygen Saturation  99 % --    Exercise Oxygen Saturation  during 6 min walk 99 % --    Max Ex. HR 62  bpm 68 bpm    Max Ex. BP 128/60 128/82    2 Minute Post BP 126/64 120/80          Nutrition & Weight - Outcomes:  Pre Biometrics - 12/29/23 1123       Pre Biometrics   Height 5' 9.5 (1.765 m)    Weight 157 lb 11.2 oz (71.5 kg)    Waist Circumference 34 inches    Hip Circumference 36 inches    Waist to Hip Ratio 0.94 %    BMI (Calculated) 22.96    Single Leg Stand 23.7 seconds          Post Biometrics - 03/13/24 0928        Post  Biometrics   Height 5' 9.5 (1.765 m)    Weight 155 lb 1.6 oz (70.4 kg)    Waist Circumference 34 inches    Hip Circumference 36 inches    Waist to Hip Ratio 0.94 %    BMI (Calculated) 22.58    Single Leg Stand 17.66 seconds          Nutrition:  Nutrition Therapy & Goals - 12/29/23 1346       Nutrition Therapy   Diet Cardiac, Low Na    Protein (specify units) 75-90    Fiber 30 grams    Whole Grain Foods 3 servings    Saturated Fats 15 max. grams    Fruits and Vegetables 5 servings/day    Sodium 2 grams      Personal Nutrition Goals   Nutrition Goal Read labels and reduce sodium intake to below 2300mg . Ideally 1500mg  per day.    Personal Goal #2 Reduce saturated fat, less than 12g per day. Replace bad fats  for more heart healthy fats.    Personal Goal #3 Eat 15-30gProtein and 30-60gCarbs at each meal.    Comments Patient drinking ~32oz of water  and ~32oz of regular soda. Spoke with him about drinking sugary and calorie dense beverages. He feels its part of his morning routine and would struggle with energy without it. His A1C is normal. Reviewed mediterranean diet handout. Educated on types of fats, sources and how to read label. Reviewed balanced plates handout. Discussed the importance of including colorful produce at plates. Explained the benefits of pairing carbs with protein or healthy fats. Brainstormed meal and snacks ideas with focus on variety while keeping sodium and saturated fat intake below the limits provided of 1500mg  and 12g respectively      Intervention Plan   Intervention Prescribe, educate and counsel regarding individualized specific dietary modifications aiming towards targeted core components such as weight, hypertension, lipid management, diabetes, heart failure and other comorbidities.;Nutrition handout(s) given to patient.    Expected Outcomes Long Term Goal: Adherence to prescribed nutrition plan.;Short Term Goal: A plan has been developed with personal nutrition goals set during dietitian appointment.;Short Term Goal: Understand basic principles of dietary content, such as calories, fat, sodium, cholesterol and nutrients.            Goals reviewed with patient; copy given to patient.

## 2024-03-14 ENCOUNTER — Encounter: Admitting: *Deleted

## 2024-03-14 DIAGNOSIS — Z951 Presence of aortocoronary bypass graft: Secondary | ICD-10-CM | POA: Diagnosis not present

## 2024-03-14 NOTE — Progress Notes (Signed)
 Daily Session Note  Patient Details  Name: [name] MRN: [id number] Date of Birth: 1965-10-16 Referring Provider:   Flowsheet Row Cardiac Rehab from 12/29/2023 in [facility] Cardiac and Pulmonary Rehab  Referring Provider Dr. Marsa Dooms, MD    Encounter Date: 03/14/2024  Check In:  Session Check In - 03/14/24 0923       Check-In   Supervising physician immediately available to respond to emergencies See telemetry face sheet for immediately available ER MD    Location [facility]-Cardiac & Pulmonary Rehab    Staff Present Othel Durand, RN, BSN, CCRP;Noah Tickle, BS, Exercise Physiologist;Maxon Conetta BS, Exercise Physiologist;Jason Elnor RDN,LDN    Virtual Visit No    Medication changes reported     No    Fall or balance concerns reported    No    Warm-up and Cool-down Performed on first and last piece of equipment    Resistance Training Performed Yes    VAD Patient? No    PAD/SET Patient? No      Pain Assessment   Currently in Pain? No/denies             Social History   Tobacco Use  Smoking Status Former   Current packs/day: 0.00   Average packs/day: 0.3 packs/day for 25.0 years (6.3 ttl pk-yrs)   Types: Cigarettes   Start date: 09/1992   Quit date: 09/2017   Years since quitting: 6.4  Smokeless Tobacco Never    Goals Met:  Independence with exercise equipment Exercise tolerated well No report of concerns or symptoms today  Goals Unmet:  Not Applicable  Comments: Pt able to follow exercise prescription today without complaint.  Will continue to monitor for progression.    Dr. Oneil Pinal is Medical Director for [facility] Cardiac Rehabilitation.  Dr. Fuad Aleskerov is Medical Director for [facility] Pulmonary Rehabilitation.

## 2024-03-16 ENCOUNTER — Encounter: Admitting: Vascular Surgery

## 2024-03-16 ENCOUNTER — Encounter: Admitting: *Deleted

## 2024-03-16 DIAGNOSIS — Z951 Presence of aortocoronary bypass graft: Secondary | ICD-10-CM | POA: Diagnosis not present

## 2024-03-16 NOTE — Progress Notes (Signed)
 Daily Session Note  Patient Details  Name: [name] MRN: [id number] Date of Birth: 03-05-1966 Referring Provider:   Flowsheet Row Cardiac Rehab from 12/29/2023 in [facility] Cardiac and Pulmonary Rehab  Referring Provider Dr. Marsa Dooms, MD    Encounter Date: 03/16/2024  Check In:  Session Check In - 03/16/24 0933       Check-In   Supervising physician immediately available to respond to emergencies See telemetry face sheet for immediately available ER MD    Location [facility]-Cardiac & Pulmonary Rehab    Staff Present Othel Durand, RN, BSN, CCRP;Joseph Hood RCP,RRT,BSRT;Noah Tickle, [place], Exercise Physiologist;Maxon Conetta BS, Exercise Physiologist    Virtual Visit No    Medication changes reported     No    Fall or balance concerns reported    No    Warm-up and Cool-down Performed on first and last piece of equipment    Resistance Training Performed Yes    VAD Patient? No    PAD/SET Patient? No      Pain Assessment   Currently in Pain? No/denies             Social History   Tobacco Use  Smoking Status Former   Current packs/day: 0.00   Average packs/day: 0.3 packs/day for 25.0 years (6.3 ttl pk-yrs)   Types: Cigarettes   Start date: 09/1992   Quit date: 09/2017   Years since quitting: 6.4  Smokeless Tobacco Never    Goals Met:  Independence with exercise equipment Exercise tolerated well No report of concerns or symptoms today  Goals Unmet:  Not Applicable  Comments: Pt able to follow exercise prescription today without complaint.  Will continue to monitor for progression.    Dr. Oneil Pinal is Medical Director for [facility] Cardiac Rehabilitation.  Dr. Fuad Aleskerov is Medical Director for [facility] Pulmonary Rehabilitation.

## 2024-03-17 ENCOUNTER — Other Ambulatory Visit: Payer: Self-pay

## 2024-03-17 ENCOUNTER — Encounter (HOSPITAL_COMMUNITY)
Admission: RE | Disposition: A | Discharge status: Home / Self Care | Payer: Self-pay | Source: Home / Self Care | Attending: Vascular Surgery

## 2024-03-17 ENCOUNTER — Inpatient Hospital Stay (HOSPITAL_COMMUNITY)

## 2024-03-17 ENCOUNTER — Inpatient Hospital Stay (HOSPITAL_COMMUNITY)
Admission: RE | Admit: 2024-03-17 | Discharge: 2024-03-18 | DRG: 039 | Disposition: A | Discharge status: Home / Self Care | Attending: Vascular Surgery | Admitting: Vascular Surgery

## 2024-03-17 ENCOUNTER — Encounter (HOSPITAL_COMMUNITY): Payer: Self-pay | Admitting: Vascular Surgery

## 2024-03-17 ENCOUNTER — Inpatient Hospital Stay (HOSPITAL_COMMUNITY): Payer: Self-pay | Admitting: Vascular Surgery

## 2024-03-17 DIAGNOSIS — I10 Essential (primary) hypertension: Secondary | ICD-10-CM | POA: Diagnosis present

## 2024-03-17 DIAGNOSIS — Z79899 Other long term (current) drug therapy: Secondary | ICD-10-CM

## 2024-03-17 DIAGNOSIS — Z87891 Personal history of nicotine dependence: Secondary | ICD-10-CM

## 2024-03-17 DIAGNOSIS — Z951 Presence of aortocoronary bypass graft: Secondary | ICD-10-CM

## 2024-03-17 DIAGNOSIS — Z888 Allergy status to other drugs, medicaments and biological substances status: Secondary | ICD-10-CM

## 2024-03-17 DIAGNOSIS — I2510 Atherosclerotic heart disease of native coronary artery without angina pectoris: Secondary | ICD-10-CM | POA: Diagnosis present

## 2024-03-17 DIAGNOSIS — E785 Hyperlipidemia, unspecified: Secondary | ICD-10-CM | POA: Diagnosis present

## 2024-03-17 DIAGNOSIS — I6522 Occlusion and stenosis of left carotid artery: Secondary | ICD-10-CM

## 2024-03-17 DIAGNOSIS — I480 Paroxysmal atrial fibrillation: Secondary | ICD-10-CM | POA: Diagnosis present

## 2024-03-17 HISTORY — PX: ENDARTERECTOMY: SHX5162

## 2024-03-17 LAB — CBC
HCT: 40.1 % (ref 39.0–52.0)
Hemoglobin: 13.7 g/dL (ref 13.0–17.0)
MCH: 29.8 pg (ref 26.0–34.0)
MCHC: 34.2 g/dL (ref 30.0–36.0)
MCV: 87.2 fL (ref 80.0–100.0)
Platelets: 137 10*3/uL — ABNORMAL LOW (ref 150–400)
RBC: 4.6 MIL/uL (ref 4.22–5.81)
RDW: 15.3 % (ref 11.5–15.5)
WBC: 8 10*3/uL (ref 4.0–10.5)
nRBC: 0 % (ref 0.0–0.2)

## 2024-03-17 LAB — CREATININE, SERUM
Creatinine, Ser: 0.76 mg/dL (ref 0.61–1.24)
GFR, Estimated: >60 mL/min (ref >60)

## 2024-03-17 SURGERY — ENDARTERECTOMY, CAROTID
Anesthesia: General | Laterality: Left

## 2024-03-17 MED ORDER — DOCUSATE SODIUM 100 MG PO CAPS
100.0000 mg | ORAL_CAPSULE | Freq: Every day | ORAL | Status: DC
  Administered 2024-03-18: 100 mg via ORAL
  Filled 2024-03-17: qty 1

## 2024-03-17 MED ORDER — ALBUMIN HUMAN 5 % IV SOLN
12.5000 g | Freq: Once | INTRAVENOUS | Status: AC
  Administered 2024-03-17: 12.5 g via INTRAVENOUS

## 2024-03-17 MED ORDER — SODIUM CHLORIDE 0.9 % IV SOLN: INTRAVENOUS | Status: DC

## 2024-03-17 MED ORDER — PHENYLEPHRINE HCL-NACL 20-0.9 MG/250ML-% IV SOLN
INTRAVENOUS | Status: DC | PRN
  Administered 2024-03-17: 120 ug via INTRAVENOUS
  Administered 2024-03-17 (×2): 80 ug via INTRAVENOUS
  Administered 2024-03-17: 120 ug via INTRAVENOUS

## 2024-03-17 MED ORDER — ACETAMINOPHEN 650 MG RE SUPP: 325.0000 mg | RECTAL | Status: DC | PRN

## 2024-03-17 MED ORDER — PROPOFOL 10 MG/ML IV BOLUS
INTRAVENOUS | Status: AC
  Filled 2024-03-17: qty 20

## 2024-03-17 MED ORDER — GENTAMICIN SULFATE 40 MG/ML IJ SOLN
INTRAMUSCULAR | Status: DC | PRN
  Administered 2024-03-17: 500 mL

## 2024-03-17 MED ORDER — LORATADINE 10 MG PO TABS
10.0000 mg | ORAL_TABLET | Freq: Every day | ORAL | Status: DC
  Administered 2024-03-18: 10 mg via ORAL
  Filled 2024-03-17 (×2): qty 1

## 2024-03-17 MED ORDER — SODIUM CHLORIDE 0.9% FLUSH: 3.0000 mL | INTRAVENOUS | Status: DC | PRN

## 2024-03-17 MED ORDER — HEMOSTATIC AGENTS (NO CHARGE) OPTIME
TOPICAL | Status: DC | PRN
Start: 2024-03-17 — End: 2024-03-17
  Administered 2024-03-17: 1 via TOPICAL

## 2024-03-17 MED ORDER — FENTANYL CITRATE (PF) 250 MCG/5ML IJ SOLN
INTRAMUSCULAR | Status: AC
  Filled 2024-03-17: qty 5

## 2024-03-17 MED ORDER — BISACODYL 5 MG PO TBEC: 5.0000 mg | DELAYED_RELEASE_TABLET | Freq: Every day | ORAL | Status: DC | PRN

## 2024-03-17 MED ORDER — ASPIRIN 81 MG PO TBEC
81.0000 mg | DELAYED_RELEASE_TABLET | Freq: Every day | ORAL | Status: DC
  Administered 2024-03-18: 81 mg via ORAL
  Filled 2024-03-17 (×2): qty 1

## 2024-03-17 MED ORDER — NOREPINEPHRINE 4 MG/250ML-% IV SOLN
INTRAVENOUS | Status: DC | PRN
  Administered 2024-03-17: 2 ug/min via INTRAVENOUS

## 2024-03-17 MED ORDER — CEFAZOLIN SODIUM-DEXTROSE 2-4 GM/100ML-% IV SOLN
2.0000 g | INTRAVENOUS | Status: AC
  Administered 2024-03-17: 2 g via INTRAVENOUS
  Filled 2024-03-17: qty 100

## 2024-03-17 MED ORDER — EPHEDRINE SULFATE-NACL 50-0.9 MG/10ML-% IV SOSY
PREFILLED_SYRINGE | INTRAVENOUS | Status: DC | PRN
  Administered 2024-03-17: 5 mg via INTRAVENOUS

## 2024-03-17 MED ORDER — PRAVASTATIN SODIUM 40 MG PO TABS
40.0000 mg | ORAL_TABLET | Freq: Every day | ORAL | Status: DC
  Administered 2024-03-17: 40 mg via ORAL
  Filled 2024-03-17 (×2): qty 1

## 2024-03-17 MED ORDER — ACETAMINOPHEN 325 MG PO TABS
325.0000 mg | ORAL_TABLET | ORAL | Status: DC | PRN
  Administered 2024-03-17: 650 mg via ORAL
  Filled 2024-03-17: qty 2

## 2024-03-17 MED ORDER — ALBUMIN HUMAN 5 % IV SOLN: INTRAVENOUS | Status: DC | PRN

## 2024-03-17 MED ORDER — CEFAZOLIN SODIUM-DEXTROSE 2-4 GM/100ML-% IV SOLN
2.0000 g | Freq: Three times a day (TID) | INTRAVENOUS | Status: AC
  Administered 2024-03-17 – 2024-03-18 (×2): 2 g via INTRAVENOUS
  Filled 2024-03-17 (×2): qty 100

## 2024-03-17 MED ORDER — POTASSIUM CHLORIDE CRYS ER 20 MEQ PO TBCR: 40.0000 meq | EXTENDED_RELEASE_TABLET | Freq: Every day | ORAL | Status: DC | PRN

## 2024-03-17 MED ORDER — PROPOFOL 10 MG/ML IV BOLUS
INTRAVENOUS | Status: DC | PRN
  Administered 2024-03-17: 30 mg via INTRAVENOUS
  Administered 2024-03-17: 120 mg via INTRAVENOUS

## 2024-03-17 MED ORDER — SUGAMMADEX SODIUM 200 MG/2ML IV SOLN
INTRAVENOUS | Status: DC | PRN
  Administered 2024-03-17: 200 mg via INTRAVENOUS

## 2024-03-17 MED ORDER — FENTANYL CITRATE (PF) 250 MCG/5ML IJ SOLN
INTRAMUSCULAR | Status: DC | PRN
  Administered 2024-03-17: 25 ug via INTRAVENOUS
  Administered 2024-03-17: 50 ug via INTRAVENOUS
  Administered 2024-03-17: 25 ug via INTRAVENOUS
  Administered 2024-03-17: 50 ug via INTRAVENOUS

## 2024-03-17 MED ORDER — ACETAMINOPHEN 500 MG PO TABS: 1000.0000 mg | ORAL_TABLET | Freq: Once | ORAL | Status: DC

## 2024-03-17 MED ORDER — HEPARIN SODIUM (PORCINE) 1000 UNIT/ML IJ SOLN
INTRAMUSCULAR | Status: DC | PRN
  Administered 2024-03-17: 7000 [IU] via INTRAVENOUS

## 2024-03-17 MED ORDER — PROTAMINE SULFATE 10 MG/ML IV SOLN
INTRAVENOUS | Status: DC | PRN
  Administered 2024-03-17: 50 mg via INTRAVENOUS

## 2024-03-17 MED ORDER — CHLORHEXIDINE GLUCONATE CLOTH 2 % EX PADS: 6.0000 | MEDICATED_PAD | Freq: Once | CUTANEOUS | Status: DC

## 2024-03-17 MED ORDER — MIDAZOLAM HCL 2 MG/2ML IJ SOLN
INTRAMUSCULAR | Status: AC
  Filled 2024-03-17: qty 2

## 2024-03-17 MED ORDER — HEMOSTATIC AGENTS (NO CHARGE) OPTIME
TOPICAL | Status: DC | PRN
  Administered 2024-03-17: 1 via TOPICAL

## 2024-03-17 MED ORDER — SODIUM CHLORIDE 0.9 % IV SOLN
INTRAVENOUS | Status: DC | PRN
Start: 2024-03-17 — End: 2024-03-17

## 2024-03-17 MED ORDER — SENNOSIDES-DOCUSATE SODIUM 8.6-50 MG PO TABS: 1.0000 | ORAL_TABLET | Freq: Every evening | ORAL | Status: DC | PRN

## 2024-03-17 MED ORDER — SODIUM CHLORIDE 0.9 % IV SOLN
500.0000 mL | Freq: Once | INTRAVENOUS | Status: DC | PRN
Start: 2024-03-17 — End: 2024-03-18

## 2024-03-17 MED ORDER — DROPERIDOL 2.5 MG/ML IJ SOLN: 0.6250 mg | Freq: Once | INTRAMUSCULAR | Status: DC | PRN

## 2024-03-17 MED ORDER — ONDANSETRON HCL 4 MG/2ML IJ SOLN
INTRAMUSCULAR | Status: DC | PRN
  Administered 2024-03-17: 4 mg via INTRAVENOUS

## 2024-03-17 MED ORDER — PHENOL 1.4 % MT LIQD: 1.0000 | OROMUCOSAL | Status: DC | PRN

## 2024-03-17 MED ORDER — LIDOCAINE 2% (20 MG/ML) 5 ML SYRINGE
INTRAMUSCULAR | Status: DC | PRN
  Administered 2024-03-17: 60 mg via INTRAVENOUS

## 2024-03-17 MED ORDER — LIDOCAINE-EPINEPHRINE (PF) 1 %-1:200000 IJ SOLN
INTRAMUSCULAR | Status: DC | PRN
  Administered 2024-03-17: 3 mL

## 2024-03-17 MED ORDER — SODIUM CHLORIDE 0.9 % IV SOLN: 250.0000 mL | INTRAVENOUS | Status: DC | PRN

## 2024-03-17 MED ORDER — OXYCODONE-ACETAMINOPHEN 5-325 MG PO TABS: 1.0000 | ORAL_TABLET | ORAL | Status: DC | PRN

## 2024-03-17 MED ORDER — LIDOCAINE HCL (PF) 1 % IJ SOLN
INTRAMUSCULAR | Status: AC
  Filled 2024-03-17: qty 30

## 2024-03-17 MED ORDER — PHENYLEPHRINE HCL-NACL 20-0.9 MG/250ML-% IV SOLN
INTRAVENOUS | Status: AC
  Filled 2024-03-17: qty 250

## 2024-03-17 MED ORDER — AMLODIPINE BESYLATE 10 MG PO TABS
10.0000 mg | ORAL_TABLET | Freq: Every day | ORAL | Status: DC
  Administered 2024-03-18: 10 mg via ORAL
  Filled 2024-03-17 (×2): qty 1

## 2024-03-17 MED ORDER — MIDAZOLAM HCL 2 MG/2ML IJ SOLN
INTRAMUSCULAR | Status: DC | PRN
  Administered 2024-03-17 (×2): 1 mg via INTRAVENOUS

## 2024-03-17 MED ORDER — SODIUM CHLORIDE 0.9% FLUSH
3.0000 mL | Freq: Two times a day (BID) | INTRAVENOUS | Status: DC
  Administered 2024-03-17 – 2024-03-18 (×3): 3 mL via INTRAVENOUS

## 2024-03-17 MED ORDER — CLOPIDOGREL BISULFATE 75 MG PO TABS
75.0000 mg | ORAL_TABLET | Freq: Every day | ORAL | Status: DC
  Administered 2024-03-18: 75 mg via ORAL
  Filled 2024-03-17 (×2): qty 1

## 2024-03-17 MED ORDER — LABETALOL HCL 5 MG/ML IV SOLN: 10.0000 mg | INTRAVENOUS | Status: DC | PRN

## 2024-03-17 MED ORDER — HYDROMORPHONE HCL 1 MG/ML IJ SOLN: 0.5000 mg | INTRAMUSCULAR | Status: DC | PRN

## 2024-03-17 MED ORDER — LIDOCAINE-EPINEPHRINE (PF) 1 %-1:200000 IJ SOLN
INTRAMUSCULAR | Status: AC
  Filled 2024-03-17: qty 30

## 2024-03-17 MED ORDER — ONDANSETRON HCL 4 MG/2ML IJ SOLN: 4.0000 mg | Freq: Four times a day (QID) | INTRAMUSCULAR | Status: DC | PRN

## 2024-03-17 MED ORDER — ROCURONIUM BROMIDE 10 MG/ML (PF) SYRINGE
PREFILLED_SYRINGE | INTRAVENOUS | Status: DC | PRN
  Administered 2024-03-17: 40 mg via INTRAVENOUS
  Administered 2024-03-17: 60 mg via INTRAVENOUS
  Administered 2024-03-17: 20 mg via INTRAVENOUS

## 2024-03-17 MED ORDER — FENTANYL CITRATE (PF) 100 MCG/2ML IJ SOLN: 25.0000 ug | INTRAMUSCULAR | Status: DC | PRN

## 2024-03-17 MED ORDER — LACTATED RINGERS IV SOLN: INTRAVENOUS | Status: DC

## 2024-03-17 MED ORDER — SODIUM CHLORIDE 0.9 % IV SOLN
0.1500 ug/kg/min | INTRAVENOUS | Status: DC
  Filled 2024-03-17: qty 2000

## 2024-03-17 MED ORDER — HEPARIN 6000 UNIT IRRIGATION SOLUTION
Status: AC
  Filled 2024-03-17: qty 500

## 2024-03-17 MED ORDER — METOPROLOL TARTRATE 5 MG/5ML IV SOLN: 2.5000 mg | INTRAVENOUS | Status: DC | PRN

## 2024-03-17 MED ORDER — ORAL CARE MOUTH RINSE
15.0000 mL | Freq: Once | OROMUCOSAL | Status: AC
Start: 2024-03-17 — End: 2024-03-17

## 2024-03-17 MED ORDER — HEPARIN 6000 UNIT IRRIGATION SOLUTION
Status: DC | PRN
  Administered 2024-03-17: 1

## 2024-03-17 MED ORDER — HEPARIN SODIUM (PORCINE) 5000 UNIT/ML IJ SOLN
5000.0000 [IU] | Freq: Three times a day (TID) | INTRAMUSCULAR | Status: DC
  Administered 2024-03-18: 5000 [IU] via SUBCUTANEOUS
  Filled 2024-03-17: qty 1

## 2024-03-17 MED ORDER — HYDRALAZINE HCL 20 MG/ML IJ SOLN: 5.0000 mg | INTRAMUSCULAR | Status: DC | PRN

## 2024-03-17 MED ORDER — CHLORHEXIDINE GLUCONATE 0.12 % MT SOLN
15.0000 mL | Freq: Once | OROMUCOSAL | Status: AC
  Administered 2024-03-17: 15 mL via OROMUCOSAL
  Filled 2024-03-17: qty 15

## 2024-03-17 MED ORDER — ALBUMIN HUMAN 5 % IV SOLN
INTRAVENOUS | Status: AC
  Filled 2024-03-17: qty 250

## 2024-03-17 MED ORDER — DEXAMETHASONE SODIUM PHOSPHATE 10 MG/ML IJ SOLN
INTRAMUSCULAR | Status: DC | PRN
  Administered 2024-03-17: 10 mg via INTRAVENOUS

## 2024-03-17 MED ORDER — 0.9 % SODIUM CHLORIDE (POUR BTL) OPTIME
TOPICAL | Status: DC | PRN
  Administered 2024-03-17: 1000 mL

## 2024-03-17 SURGICAL SUPPLY — 49 items
BAG COUNTER SPONGE SURGICOUNT (BAG) ×1 IMPLANT
CANISTER SUCTION 3000ML PPV (SUCTIONS) ×1 IMPLANT
CANNULA VESSEL 3MM 2 BLNT TIP (CANNULA) ×1 IMPLANT
CATH ROBINSON RED A/P 18FR (CATHETERS) ×1 IMPLANT
CLIP TI MEDIUM 24 (CLIP) ×1 IMPLANT
CLIP TI MEDIUM 6 (CLIP) ×1 IMPLANT
CLIP TI WIDE RED SMALL 24 (CLIP) ×1 IMPLANT
CLIP TI WIDE RED SMALL 6 (CLIP) ×1 IMPLANT
COVER PROBE W GEL 5X96 (DRAPES) ×1 IMPLANT
COVER TRANSDUCER ULTRASND GEL (DISPOSABLE) ×1 IMPLANT
DERMABOND ADVANCED .7 DNX12 (GAUZE/BANDAGES/DRESSINGS) ×1 IMPLANT
DRAIN CHANNEL 15F RND FF W/TCR (WOUND CARE) IMPLANT
DRSG TEGADERM 2-3/8X2-3/4 SM (GAUZE/BANDAGES/DRESSINGS) IMPLANT
ELECTRODE REM PT RTRN 9FT ADLT (ELECTROSURGICAL) ×1 IMPLANT
EVACUATOR SILICONE 100CC (DRAIN) IMPLANT
GLOVE BIOGEL PI IND STRL 8 (GLOVE) ×1 IMPLANT
GOWN STRL REUS W/ TWL LRG LVL3 (GOWN DISPOSABLE) ×2 IMPLANT
GOWN STRL REUS W/TWL 2XL LVL3 (GOWN DISPOSABLE) ×2 IMPLANT
HEMOSTAT SNOW SURGICEL 2X4 (HEMOSTASIS) IMPLANT
KIT BASIN OR (CUSTOM PROCEDURE TRAY) ×1 IMPLANT
KIT SHUNT ARGYLE CAROTID ART 6 (VASCULAR PRODUCTS) IMPLANT
KIT TURNOVER KIT B (KITS) ×1 IMPLANT
NDL HYPO 25GX1X1/2 BEV (NEEDLE) IMPLANT
NDL SPNL 20GX3.5 QUINCKE YW (NEEDLE) IMPLANT
NEEDLE HYPO 25GX1X1/2 BEV (NEEDLE) IMPLANT
NEEDLE SPNL 20GX3.5 QUINCKE YW (NEEDLE) IMPLANT
NS IRRIG 1000ML POUR BTL (IV SOLUTION) ×3 IMPLANT
PACK CAROTID (CUSTOM PROCEDURE TRAY) ×1 IMPLANT
PAD ARMBOARD POSITIONER FOAM (MISCELLANEOUS) ×2 IMPLANT
PATCH VASC XENOSURE 1X6 (Vascular Products) IMPLANT
POSITIONER HEAD DONUT 9IN (MISCELLANEOUS) ×1 IMPLANT
SET WALTER ACTIVATION W/DRAPE (SET/KITS/TRAYS/PACK) ×1 IMPLANT
SHUNT CAROTID BYPASS 10 (VASCULAR PRODUCTS) IMPLANT
SHUNT CAROTID BYPASS 12FRX15.5 (VASCULAR PRODUCTS) IMPLANT
SPONGE SURGIFOAM ABS GEL 100 (HEMOSTASIS) IMPLANT
STOPCOCK 4 WAY LG BORE MALE ST (IV SETS) IMPLANT
SURGIFLO W/THROMBIN 8M KIT (HEMOSTASIS) IMPLANT
SUT ETHILON 3 0 PS 1 (SUTURE) IMPLANT
SUT MNCRL AB 4-0 PS2 18 (SUTURE) ×1 IMPLANT
SUT PROLENE 6 0 BV (SUTURE) ×1 IMPLANT
SUT PROLENE 7 0 BV1 MDA (SUTURE) IMPLANT
SUT SILK 3-0 18XBRD TIE 12 (SUTURE) IMPLANT
SUT VIC AB 2-0 CT1 TAPERPNT 27 (SUTURE) ×1 IMPLANT
SUT VIC AB 3-0 SH 27X BRD (SUTURE) ×1 IMPLANT
SYR 20CC LL (SYRINGE) ×2 IMPLANT
SYR CONTROL 10ML LL (SYRINGE) IMPLANT
TOWEL GREEN STERILE (TOWEL DISPOSABLE) ×1 IMPLANT
VASCULAR TIE MINI RED 18IN STL (MISCELLANEOUS) IMPLANT
WATER STERILE IRR 1000ML POUR (IV SOLUTION) ×1 IMPLANT

## 2024-03-17 NOTE — Op Note (Signed)
 NAME: DONATO STUDLEY    MRN: [id number] DOB: [birth year]    DATE OF OPERATION: 03/17/2024  PREOP DIAGNOSIS:    Asymptomatic left ICA stenosis  POSTOP DIAGNOSIS:    Same  PROCEDURE:    Left carotid endarterectomy with bovine pericardial patch plasty  SURGEON: Fonda FORBES Rim  ASSIST: Curry Damme, PA  ANESTHESIA: General  EBL: 50 mL  INDICATIONS:    OLANREWAJU OSBORN is a 58 y.o. male well-known to my service having previously undergone right-sided carotid endarterectomy for asymptomatic critical ICA stenosis.  He presents today with left-sided critical ICA stenosis.  After discussing the risks and benefits of left carotid enterectomy, Christopher elected to proceed.  FINDINGS:   Greater than 80% stenosis of the left ICA with disease extending to centimeters into the ICA.  Soft plaque  TECHNIQUE:    After full informed written consent was obtained from the patient, the patient was brought back to the operating room and placed supine upon the operating table.  Prior to induction, the patient received IV antibiotics.  After obtaining adequate anesthesia, the patient was placed into semi-Fowler position with a shoulder roll in place and the patient's neck slightly hyperextended and rotated away from the surgical site.  The patient was prepped in the standard fashion for a left carotid endarterectomy.  I made an incision anterior to the sternocleidomastoid muscle and dissected down through the subcutaneous tissue.  The platysmas was opened with electrocautery.  Then I dissected down to the internal jugular vein.  This was dissected posteriorly until I obtained visualization of the common carotid artery.  This was dissected out and then an umbilical tape was placed around the common carotid artery and I loosely applied a Rumel tourniquet.  I then dissected in a periadventitial fashion along the common carotid artery up to the bifurcation.  I then identified the external carotid artery and the  superior thyroid artery.  A 2-0 silk tie was looped around the superior thyroid artery, and I also dissected out the external carotid artery and placed a vessel loop around it.  In continuing the dissection to the internal carotid artery, I identified the facial vein.  This was ligated and then transected, giving me improved exposure of the internal carotid artery.  In the process of this dissection, the hypoglossal nerve was identified.  I then dissected out the internal carotid artery until I identified an area of soft tissue in the internal carotid artery.  I dissected slightly distal to this area, and placed an umbilical tape around the artery and loosely applied a Rumel tourniquet.  At this point, we gave the patient a therapeutic bolus of Heparin  intravenously (roughly 100 units/kg).  After waiting 3 minutes, then I clamped the internal carotid artery, external carotid artery and then the common carotid artery.  I then made an arteriotomy in the common carotid artery with a 11 blade.  The internal carotid artery was backbled.  This was brisk and pulsatile, therefore I elected to move forward with endarterectomy without shunt.  At this point, I started the endarterectomy in the common carotid artery with a Cytogeneticist and carried this dissection down into the common carotid artery circumferentially.  Then I transected the plaque at a segment where it was adherent.  I then carried this dissection up into the external carotid artery.  The plaque was extracted by unclamping the external carotid artery and everting the artery.  The dissection was then carried into the internal carotid artery,  extracting the remaining portion of the carotid plaque.  I passed the plaque off the field as a specimen.  I then spent the next 30 minutes removing intimal flaps and loose debris.  Eventually I reached the point where the residual plaque was densely adherent and any further dissection would compromise the integrity of  the wall.  After verifying that there was no more loose intimal flaps or debris, I re-interrogated the entirety of this carotid artery.  At this point, I was satisfied that the minimal remaining disease was densely adherent to the wall and wall integrity was intact.  At this point, I then fashioned a bovine pericardial patch for the geometry of this artery and sewed it in place with two running stitch of 6-0 Prolene, one from each end.   At this point, I allowed the internal carotid artery, common carotid artery external carotid artery to backbleed, which was excellent.  Then I instilled heparinized saline in this patched artery and then completed the patch angioplasty in the usual fashion.  First, I released the clamp on the external carotid artery, then I released it on the common carotid artery.  After waiting a few seconds, I then released it on the internal carotid artery.  I then interrogated this patient's arteries with the continuous Doppler.  The audible waveforms in each artery were consistent with the expected characteristics for each artery.  The Sonosite probe was then sterilely draped and used to interrogate the carotid artery in both longitudinal and transverse views.  At this point, I washed out the wound, and placed thrombin  and Gelfoam throughout.  I also gave the patient 50 mg of protamine  to reverse his anticoagulation.   After waiting a few minutes, I removed the thrombin  and Gelfoam and washed out the wound.  There was no more active bleeding in the surgical site.  A 15 [place] Blake drain was placed in the wound bed.  I then reapproximated the platysma muscle with a running stitch of 2-0 Vicryl.  The skin was then reapproximated with a running subcuticular 4-0 Monocryl stitch.  The skin was then cleaned, dried and Dermabond was used to reinforce the skin closure.  The patient woke without any problems, neurologically intact.     Fonda FORBES Rim, MD [facility] DATE OF DICTATION:   03/17/2024

## 2024-03-17 NOTE — Anesthesia Procedure Notes (Signed)
 Arterial Line Insertion Start/End6/27/2025 7:00 AM Performed by: Dorethea Cordella SQUIBB, DO, Cindie Ronal POUR, CRNA, CRNA  Patient location: Pre-op. Preanesthetic checklist: patient identified, IV checked, site marked, risks and benefits discussed, surgical consent, monitors and equipment checked, pre-op evaluation, timeout performed and anesthesia consent Lidocaine  1% used for infiltration Right, radial was placed Catheter size: 20 G Hand hygiene performed  and maximum sterile barriers used   Attempts: 1 Procedure performed without using ultrasound guided technique. Following insertion, dressing applied. Post procedure assessment: normal and unchanged  Patient tolerated the procedure well with no immediate complications.

## 2024-03-17 NOTE — Anesthesia Postprocedure Evaluation (Signed)
 Anesthesia Post Note  Patient: [name]  Procedure(s) Performed: ENDARTERECTOMY, CAROTID (Left)     Patient location during evaluation: PACU Anesthesia Type: General Level of consciousness: awake and alert Pain management: pain level controlled Vital Signs Assessment: post-procedure vital signs reviewed and stable Respiratory status: spontaneous breathing, nonlabored ventilation, respiratory function stable and patient connected to nasal cannula oxygen Cardiovascular status: blood pressure returned to baseline and stable Postop Assessment: no apparent nausea or vomiting Anesthetic complications: no   No notable events documented.  Last Vitals:  Vitals:   03/17/24 1330 03/17/24 1345  BP: (!) 103/55 (!) 104/57  Pulse: (!) 49 (!) 48  Resp: 16 14  Temp:  36.6 C  SpO2: 94% 96%    Last Pain:  Vitals:   03/17/24 1330  TempSrc:   PainSc: 0-No pain                 Cordella P Mostafa Yuan

## 2024-03-17 NOTE — Progress Notes (Signed)
  Day of Surgery Note    Subjective:  pt seen in recovery about an hour ago.  Doing well without complaints.    Vitals:   03/17/24 1245 03/17/24 1300  BP: (!) 103/56 (!) 105/59  Pulse: (!) 52 (!) 50  Resp: 14 16  Temp:    SpO2: 97% 96%    Incisions:   clean and dry without hematoma Neuro:  in tact; tongue is midline and he is moving all extremities equally Lungs:  non labored   Assessment/Plan:  This is a 58 y.o. male who is s/p  Left carotid endarterectomy for asymptomatic carotid artery stenosis with JP drain placement by Dr. Lanis on 03/17/2024   -pt doing well in recovery and neuro in tact.  -plan for 4E when bed available.   -continue asa statin plavix    Lucie Apt, PA-C 03/17/2024 1:07 PM [phone]

## 2024-03-17 NOTE — Anesthesia Procedure Notes (Signed)
 Procedure Name: Intubation Date/Time: 03/17/2024 7:48 AM  Performed by: Cindie Ronal POUR, CRNAPre-anesthesia Checklist: Patient identified, Emergency Drugs available, Suction available and Patient being monitored Patient Re-evaluated:Patient Re-evaluated prior to induction Oxygen Delivery Method: Circle system utilized Preoxygenation: Pre-oxygenation with 100% oxygen Induction Type: IV induction Ventilation: Mask ventilation without difficulty Laryngoscope Size: Miller and 2 Grade View: Grade I Tube type: Oral Tube size: 7.5 mm Number of attempts: 1 Airway Equipment and Method: Stylet Placement Confirmation: ETT inserted through vocal cords under direct vision, positive ETCO2 and breath sounds checked- equal and bilateral Secured at: 24 cm Tube secured with: Tape Dental Injury: Teeth and Oropharynx as per pre-operative assessment

## 2024-03-17 NOTE — Plan of Care (Signed)
   Problem: Education: Goal: Knowledge of General Education information will improve Description: Including pain rating scale, medication(s)/side effects and non-pharmacologic comfort measures Outcome: Progressing   Problem: Activity: Goal: Risk for activity intolerance will decrease Outcome: Progressing   Problem: Pain Managment: Goal: General experience of comfort will improve and/or be controlled Outcome: Progressing

## 2024-03-17 NOTE — Discharge Instructions (Signed)

## 2024-03-17 NOTE — H&P (Signed)
  POST OPERATIVE OFFICE NOTE  Patient seen and examined in preop holding.  No complaints. No changes to medication history or physical exam since last seen in clinic. After discussing the risks and benefits of left carotid endarterectomy for asymptomatic carotid stenosis, [name] elected to proceed.   Fonda FORBES Rim MD   CC:  F/u for surgery  HPI:  This is a 58 y.o. male who was found to have asymptomatic critical right-sided ICA stenosis. He is s/p right CEA on 01/25/24 by Dr. Rim.  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 He has done well, and is currently in cardiopulmonary rehab.   Pt returns today for follow up.  Pt states he has no symptoms of stroke or TIA.  He denies amaurosis, weakness in extremities or aphasia.  He wants to talk about the left ICA stenosis today.   Allergies  Allergen Reactions   Atorvastatin Other (See Comments)    Pain and cramps. Like hit by a truck    Lopid [Gemfibrozil] Other (See Comments)    Legs hurt    Current Facility-Administered Medications  Medication Dose Route Frequency Provider Last Rate Last Admin   0.9 %  sodium chloride  infusion   Intravenous Continuous Kamau Weatherall E, MD       acetaminophen  (TYLENOL ) tablet 1,000 mg  1,000 mg Oral Once Stoltzfus, Gregory P, DO       ceFAZolin  (ANCEF ) IVPB 2g/100 mL premix  2 g Intravenous 30 min Pre-Op Steffan Caniglia E, MD       Chlorhexidine  Gluconate Cloth 2 % PADS 6 each  6 each Topical Once Enyla Lisbon E, MD       And   Chlorhexidine  Gluconate Cloth 2 % PADS 6 each  6 each Topical Once Hagar Sadiq E, MD       lactated ringers  infusion   Intravenous Continuous Stoltzfus, Cordella SQUIBB, DO       remifentanil  (ULTIVA ) 2 mg in 100 mL normal saline (20 mcg/mL) Optime  0.15 mcg/kg/min Intravenous To OR Cindie Ronal POUR, CRNA         ROS:  See HPI  Physical Exam:    Incision:  well healed right neck incision Extremities:  palpable radial pulses, right neck  numbness surrounding the incision Neuro: no facial droop or tongue deviation Lungs :  non labored clear to auscultation    Assessment/Plan:  This is a 58 y.o. male who is s/p: Right CEA.  We reviewed the CTA and carotid duplex which shows he has asymptomatic ICA stenosis > than 80% on the left as well.  We will plan left CEA with DR. Socorro Ebron at the end of June.  He will continue to take ASA and Plavix .  Continue cardiac rehab.     Fonda FORBES Rim [facility] [phone]

## 2024-03-17 NOTE — Plan of Care (Signed)

## 2024-03-17 NOTE — Transfer of Care (Signed)
 Immediate Anesthesia Transfer of Care Note  Patient: [name]  Procedure(s) Performed: ENDARTERECTOMY, CAROTID (Left)  Patient Location: PACU  Anesthesia Type:General  Level of Consciousness: awake and sedated  Airway & Oxygen Therapy: Patient Spontanous Breathing and Patient connected to face mask oxygen  Post-op Assessment: Report given to RN and Post -op Vital signs reviewed and stable  Post vital signs: Reviewed and stable  Last Vitals:  Vitals Value Taken Time  BP 100/63 03/17/24 10:30  Temp    Pulse 58 03/17/24 10:31  Resp 19 03/17/24 10:31  SpO2 96 % 03/17/24 10:31  Vitals shown include unfiled device data.  Last Pain:  Vitals:   03/17/24 0604  TempSrc:   PainSc: 0-No pain         Complications: No notable events documented.

## 2024-03-18 LAB — LIPID PANEL
Cholesterol: 159 mg/dL (ref 0–200)
HDL: 45 mg/dL (ref >40)
LDL Cholesterol: 97 mg/dL (ref 0–99)
Total CHOL/HDL Ratio: 3.5 ratio
Triglycerides: 87 mg/dL (ref <150)
VLDL: 17 mg/dL (ref 0–40)

## 2024-03-18 LAB — BASIC METABOLIC PANEL WITH GFR
Anion gap: 9 (ref 5–15)
BUN: 13 mg/dL (ref 6–20)
CO2: 22 mmol/L (ref 22–32)
Calcium: 8.3 mg/dL — ABNORMAL LOW (ref 8.9–10.3)
Chloride: 108 mmol/L (ref 98–111)
Creatinine, Ser: 0.9 mg/dL (ref 0.61–1.24)
GFR, Estimated: >60 mL/min (ref >60)
Glucose, Bld: 126 mg/dL — ABNORMAL HIGH (ref 70–99)
Potassium: 3.9 mmol/L (ref 3.5–5.1)
Sodium: 139 mmol/L (ref 135–145)

## 2024-03-18 LAB — CBC
HCT: 36.9 % — ABNORMAL LOW (ref 39.0–52.0)
Hemoglobin: 12.4 g/dL — ABNORMAL LOW (ref 13.0–17.0)
MCH: 29.7 pg (ref 26.0–34.0)
MCHC: 33.6 g/dL (ref 30.0–36.0)
MCV: 88.3 fL (ref 80.0–100.0)
Platelets: 117 10*3/uL — ABNORMAL LOW (ref 150–400)
RBC: 4.18 MIL/uL — ABNORMAL LOW (ref 4.22–5.81)
RDW: 15.5 % (ref 11.5–15.5)
WBC: 9 10*3/uL (ref 4.0–10.5)
nRBC: 0 % (ref 0.0–0.2)

## 2024-03-18 LAB — POCT ACTIVATED CLOTTING TIME: Activated Clotting Time: 308 s

## 2024-03-18 MED ORDER — OXYCODONE-ACETAMINOPHEN 5-325 MG PO TABS: 1.0000 | ORAL_TABLET | Freq: Four times a day (QID) | ORAL | 0 refills | Status: AC | PRN

## 2024-03-18 NOTE — Progress Notes (Signed)
 Reviewed AVS, patient expressed understanding of medications, MD follow up reviewed.   Removed IV, Site clean, dry and intact.  Patient states all belongings brought to the hospital at time of admission are accounted for and packed to take home.  Picked up medications from [facility] pharmacy. Pt transported to entrance A where family member was waiting in vehicle to transport home.

## 2024-03-18 NOTE — Progress Notes (Signed)
  Daily Progress Note  S/p: L CEA  Subjective: Feeling well, no complaints, ready to go home  Objective: Vitals:   03/18/24 0249 03/18/24 0400  BP: 114/65   Pulse: (!) 57 (!) 54  Resp: 20 15  Temp: 98 F (36.7 C)   SpO2: 96% 97%    Physical Examination HDS Nonlabored breathing Neuro intact moving all 4 extremities, tongue midline Incision clean and dry, drain with minimal ouput  ASSESSMENT/PLAN:  This is a 58 y.o. male who is s/p Left carotid endarterectomy for asymptomatic carotid artery stenosis with JP drain placement by Dr. Lanis on 03/17/2024. Doing well, JP drain removed this morning at bedside. Plan for discharge today with 2-3 week follow up for incision check   Norman GORMAN Serve MD [facility] [phone] 03/18/2024  8:14 AM

## 2024-03-18 NOTE — Discharge Summary (Signed)
 Carotid Discharge Summary     [name] [birth year] 58 y.o. male  [id number]  Admission Date: 03/17/2024  Discharge Date: 03/18/2024  Physician: Dr. Fonda Rim  Admission Diagnosis: Stenosis of left carotid artery [I65.22] Asymptomatic stenosis of left carotid artery [facility] Course:  The patient was admitted to the hospital and taken to the operating room on 03/17/2024 and underwent left carotid endarterectomy.  The pt tolerated the procedure well and was transported to the PACU in good condition.   By POD 1, the pt neuro status. Tolerating diet. Voiding without difficulty. Left neck incision intact and well appearing. JP drain with minimal output. JP drain removed. Dry dressings applied. Hemodynamically stable.  The remainder of the hospital course consisted of increasing mobilization and increasing intake of solids without difficulty.  Remained stable for discharge home. PDMP reviewed and post operative pain medication sent to patients pharmacy. He will resume all home medications including Aspirin , Statin and Plavix . He will follow up in our office in 2-3 weeks for incision check.  Recent Labs    03/18/24 0455  NA 139  K 3.9  CL 108  CO2 22  GLUCOSE 126*  BUN 13  CALCIUM  8.3*   Recent Labs    03/17/24 1100 03/18/24 0455  WBC 8.0 9.0  HGB 13.7 12.4*  HCT 40.1 36.9*  PLT 137* 117*   No results for input(s): INR in the last 72 hours.   Discharge Instructions     CAROTID Sugery: Call MD for difficulty swallowing or speaking; weakness in arms or legs that is a new symtom; severe headache.  If you have increased swelling in the neck and/or  are having difficulty breathing, CALL 911   Complete by: As directed    Call MD for:  redness, tenderness, or signs of infection (pain, swelling, bleeding, redness, odor or green/yellow discharge around incision site)   Complete by: As directed    Call MD for:  severe or increased pain, loss or decreased  feeling  in affected limb(s)   Complete by: As directed    Call MD for:  temperature >100.5   Complete by: As directed    Discharge wound care:   Complete by: As directed    Keep incision dry for 24 hours. You can then shower and wash incision with mild soap and water , pat dry. Do not soak in bathtub   Driving Restrictions   Complete by: As directed    No driving for 1 week or while taking narcotic pain medication   Increase activity slowly   Complete by: As directed    Walk with assistance use walker or cane as needed   Lifting restrictions   Complete by: As directed    No lifting for 2 weeks   Resume previous diet   Complete by: As directed        Discharge Diagnosis:  Stenosis of left carotid artery [I65.22] Asymptomatic stenosis of left carotid artery [I65.22]  Secondary Diagnosis: Patient Active Problem List   Diagnosis Date Noted   Stenosis of left carotid artery 03/17/2024   Asymptomatic stenosis of left carotid artery 03/17/2024   Carotid stenosis 01/25/2024   Carotid stenosis, asymptomatic, right 01/25/2024   New onset atrial fibrillation (HCC) 11/10/2023   ABLA (acute blood loss anemia) 11/10/2023   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   Coronary artery disease 11/08/2023   Loss of appetite for more than 2 weeks 06/18/2022   Weight loss 06/18/2022   Moderate mixed hyperlipidemia  not requiring statin therapy 05/19/2022   Bilateral carotid bruits 11/08/2020   Ischemic chest pain (HCC) 11/10/2018   Primary hypertension 11/10/2018   HLD (hyperlipidemia) 11/10/2018   Anxiety 11/10/2018   Chest pain 11/10/2018   S/P drug eluting coronary stent placement 06/14/2017   Thrombocytopenia (HCC) 03/22/2017   Coronary artery disease of native artery of native heart with stable angina pectoris (HCC) 02/24/2017   Polycythemia 02/26/2016   Migraine headache with aura 01/01/2016   Primary insomnia 02/12/2015   PVC (premature ventricular contraction) 01/23/2015   Past Medical History:   Diagnosis Date   CAD (coronary artery disease)    CABG x4 February 2025 by [facility]   Carotid artery occlusion    Dysrhythmia    post-CABG PAF   HLD (hyperlipidemia)    Hypertension    PVC (premature ventricular contraction)    Thrombocytopenia (HCC)     Allergies as of 03/18/2024       Reactions   Atorvastatin Other (See Comments)   Pain and cramps. Like hit by a truck    Lopid [gemfibrozil] Other (See Comments)   Legs hurt        Medication List     STOP taking these medications    amiodarone  200 MG tablet Commonly known as: PACERONE        TAKE these medications    acetaminophen  325 MG tablet Commonly known as: TYLENOL  Take 650 mg by mouth every 6 (six) hours as needed for moderate pain (pain score 4-6).   amLODipine  10 MG tablet Commonly known as: NORVASC  Take 10 mg by mouth daily.   aspirin  81 MG tablet Take 81 mg by mouth daily.   clopidogrel  75 MG tablet Commonly known as: PLAVIX  Take 1 tablet (75 mg total) by mouth daily.   Coenzyme Q10 100 MG Tabs Take 100 mg by mouth daily.   FISH OIL PO Take 690 mg by mouth daily.   furosemide  40 MG tablet Commonly known as: LASIX  Take 1 tablet (40 mg total) by mouth daily.   loratadine  10 MG tablet Commonly known as: CLARITIN  Take 1 tablet (10 mg total) by mouth daily.   metoprolol  succinate 100 MG 24 hr tablet Commonly known as: TOPROL -XL Take 1 tablet (100 mg total) by mouth daily. Take with or immediately following a meal.   oxyCODONE -acetaminophen  5-325 MG tablet Commonly known as: Percocet Take 1 tablet by mouth every 6 (six) hours as needed for severe pain (pain score 7-10).   pravastatin  40 MG tablet Commonly known as: PRAVACHOL  Take 1 tablet (40 mg total) by mouth at bedtime.               Discharge Care Instructions  (From admission, onward)           Start     Ordered   03/18/24 0000  Discharge wound care:       Comments: Keep incision dry for 24 hours. You can then  shower and wash incision with mild soap and water , pat dry. Do not soak in bathtub   03/18/24 9167             Discharge Instructions:   [facility] Discharge Instructions Carotid Endarterectomy (CEA)  Please refer to the following instructions for your post-procedure care. Your surgeon or physician assistant will discuss any changes with you.  Activity  You are encouraged to walk as much as you can. You can slowly return to normal activities but must avoid strenuous activity and heavy  lifting until your doctor tell you it's OK. Avoid activities such as vacuuming or swinging a golf club. You can drive after one week if you are comfortable and you are no longer taking prescription pain medications. It is normal to feel tired for serval weeks after your surgery. It is also normal to have difficulty with sleep habits, eating, and bowel movements after surgery. These will go away with time.  Bathing/Showering  You may shower after you come home. Do not soak in a bathtub, hot tub, or swim until the incision heals completely.  Incision Care  Shower every day. Clean your incision with mild soap and water . Pat the area dry with a clean towel. You do not need a bandage unless otherwise instructed. Do not apply any ointments or creams to your incision. You may have skin glue on your incision. Do not peel it off. It will come off on its own in about one week. Your incision may feel thickened and raised for several weeks after your surgery. This is normal and the skin will soften over time. For Men Only: It's OK to shave around the incision but do not shave the incision itself for 2 weeks. It is common to have numbness under your chin that could last for several months.  Diet  Resume your normal diet. There are no special food restrictions following this procedure. A low fat/low cholesterol diet is recommended for all patients with vascular disease. In order to  heal from your surgery, it is CRITICAL to get adequate nutrition. Your body requires vitamins, minerals, and protein. Vegetables are the best source of vitamins and minerals. Vegetables also provide the perfect balance of protein. Processed food has little nutritional value, so try to avoid this.  Medications  Resume taking all of your medications unless your doctor or physician assistant tells you not to.  If your incision is causing pain, you may take over-the- counter pain relievers such as acetaminophen  (Tylenol ). If you were prescribed a stronger pain medication, please be aware these medications can cause nausea and constipation.  Prevent nausea by taking the medication with a snack or meal. Avoid constipation by drinking plenty of fluids and eating foods with a high amount of fiber, such as fruits, vegetables, and grains. Do not take Tylenol  if you are taking prescription pain medications.  Follow Up  Our office will schedule a follow up appointment 2-3 weeks following discharge.  Please call us  immediately for any of the following conditions  Increased pain, redness, drainage (pus) from your incision site. Fever of 101 degrees or higher. If you should develop stroke (slurred speech, difficulty swallowing, weakness on one side of your body, loss of vision) you should call 911 and go to the nearest emergency room.  Reduce your risk of vascular disease:  Stop smoking. If you would like help call [facility] at 1-800-QUIT-NOW ((307)578-6986) or [place] at [phone]. Manage your cholesterol Maintain a desired weight Control your diabetes Keep your blood pressure down  If you have any questions, please call the office at [phone].  Prescriptions given: Percocet 5-325  #10 No Refill  Disposition: Home  Patient's condition: is Excellent  Follow up: 1. Dr. Lanis in 2 weeks.   Kennard Fildes PA-C [facility] [phone]   --- For [facility] Registry use  ---   Modified Rankin score at D/C (0-6): 0  IV medication needed for:  1. Hypertension: No 2. Hypotension: No  Post-op Complications: No  1. Post-op CVA or TIA:  No  If yes: Event classification (right eye, left eye, right cortical, left cortical, verterobasilar, other): n/a  If yes: Timing of event (intra-op, <6 hrs post-op, >=6 hrs post-op, unknown): n/a  2. CN injury: No  If yes: CN not injuried   3. Myocardial infarction: No  If yes: Dx by (EKG or clinical, Troponin): n/a  4.  CHF: No  5.  Dysrhythmia (new): No  6. Wound infection: No  7. Reperfusion symptoms: No  8. Return to OR: No  If yes: return to OR for (bleeding, neurologic, other CEA incision, other): n/a  Discharge medications: Statin use:  Yes ASA use:  Yes   Beta blocker use:  Yes ACE-Inhibitor use:  No  ARB use:  No CCB use: No P2Y12 Antagonist use: Yes, [ X] Plavix , [ ]  Plasugrel, [ ]  Ticlopinine, [ ]  Ticagrelor, [ ]  Other, [ ]  No for medical reason, [ ]  Non-compliant, [ ]  Not-indicated Anti-coagulant use:  No, [ ]  Warfarin, [ ]  Rivaroxaban, [ ]  Dabigatran,

## 2024-03-20 ENCOUNTER — Encounter (HOSPITAL_COMMUNITY): Payer: Self-pay | Admitting: Vascular Surgery

## 2024-03-20 ENCOUNTER — Encounter

## 2024-03-21 ENCOUNTER — Encounter

## 2024-03-22 ENCOUNTER — Encounter: Payer: Self-pay | Admitting: *Deleted

## 2024-03-22 DIAGNOSIS — Z951 Presence of aortocoronary bypass graft: Secondary | ICD-10-CM

## 2024-03-22 NOTE — Progress Notes (Signed)
 Cardiac Individual Treatment Plan  Patient Details  Name: [name] MRN: [id number] Date of Birth: 06-22-1966 Referring Provider:   Flowsheet Row Cardiac Rehab from 12/29/2023 in [facility] Cardiac and Pulmonary Rehab  Referring Provider Dr. Marsa Dooms, MD    Initial Encounter Date:  Flowsheet Row Cardiac Rehab from 12/29/2023 in [facility] Cardiac and Pulmonary Rehab  Date 12/29/23    Visit Diagnosis: S/P CABG x 3  Patient's Home Medications on Admission:  Current Outpatient Medications:    acetaminophen  (TYLENOL ) 325 MG tablet, Take 650 mg by mouth every 6 (six) hours as needed for moderate pain (pain score 4-6)., Disp: , Rfl:    amLODipine  (NORVASC ) 10 MG tablet, Take 10 mg by mouth daily., Disp: , Rfl:    aspirin  81 MG tablet, Take 81 mg by mouth daily. , Disp: , Rfl:    clopidogrel  (PLAVIX ) 75 MG tablet, Take 1 tablet (75 mg total) by mouth daily., Disp: 90 tablet, Rfl: 1   Coenzyme Q10 100 MG TABS, Take 100 mg by mouth daily., Disp: , Rfl:    furosemide  (LASIX ) 40 MG tablet, Take 1 tablet (40 mg total) by mouth daily. (Patient not taking: Reported on 02/17/2024), Disp: 7 tablet, Rfl: 0   loratadine  (CLARITIN ) 10 MG tablet, Take 1 tablet (10 mg total) by mouth daily., Disp: 90 tablet, Rfl: 1   metoprolol  succinate (TOPROL -XL) 100 MG 24 hr tablet, Take 1 tablet (100 mg total) by mouth daily. Take with or immediately following a meal., Disp: 30 tablet, Rfl: 1   Omega-3 Fatty Acids (FISH OIL PO), Take 690 mg by mouth daily., Disp: , Rfl:    oxyCODONE -acetaminophen  (PERCOCET) 5-325 MG tablet, Take 1 tablet by mouth every 6 (six) hours as needed for severe pain (pain score 7-10)., Disp: 12 tablet, Rfl: 0   pravastatin  (PRAVACHOL ) 40 MG tablet, Take 1 tablet (40 mg total) by mouth at bedtime., Disp: 30 tablet, Rfl: 2  Past Medical History: Past Medical History:  Diagnosis Date   CAD (coronary artery disease)    CABG x4 February 2025 by [facility]   Carotid artery occlusion     Dysrhythmia    post-CABG PAF   HLD (hyperlipidemia)    Hypertension    PVC (premature ventricular contraction)    Thrombocytopenia (HCC)     Tobacco Use: Social History   Tobacco Use  Smoking Status Former   Current packs/day: 0.00   Average packs/day: 0.3 packs/day for 25.0 years (6.3 ttl pk-yrs)   Types: Cigarettes   Start date: 09/1992   Quit date: 09/2017   Years since quitting: 6.5  Smokeless Tobacco Never    Labs: Review Flowsheet  More data exists      Latest Ref Rng & Units 05/27/2022 11/04/2023 11/08/2023 01/26/2024 03/18/2024  Labs for ITP Cardiac and Pulmonary Rehab  Cholestrol 0 - 200 mg/dL 816  - - 817  840   LDL (calc) 0 - 99 mg/dL 886  - - 879  97   HDL-C >40 mg/dL 43  - - 49  45   Trlycerides <150 mg/dL 844  - - 64  87   Hemoglobin A1c 4.8 - 5.6 % - 4.7  - - -  PH, Arterial 7.35 - 7.45 - - 7.314  7.316  7.425  7.367  7.385  7.358  7.401  - -  PCO2 arterial 32 - 48 mmHg - - 42.8  42.1  27.6  44.8  39.9  38.3  36.4  - -  Bicarbonate 20.0 -  28.0 mmol/L - - 21.6  21.4  18.4  25.7  23.9  22.1  21.6  22.6  - -  TCO2 22 - 32 mmol/L - - 23  23  19  24  27  25  25  23  23  24  22  24   - -  Acid-base deficit 0.0 - 2.0 mmol/L - - 4.0  4.0  6.0  1.0  3.0  4.0  2.0  - -  O2 Saturation % - - 99  98  99  100  100  82  100  100  - -    Details       Multiple values from one day are sorted in reverse-chronological order          Exercise Target Goals: Exercise Program Goal: Individual exercise prescription set using results from initial 6 min walk test and THRR while considering  patient's activity barriers and safety.   Exercise Prescription Goal: Initial exercise prescription builds to 30-45 minutes a day of aerobic activity, 2-3 days per week.  Home exercise guidelines will be given to patient during program as part of exercise prescription that the participant will acknowledge.   Education: Aerobic Exercise: - Group verbal and visual presentation on the  components of exercise prescription. Introduces F.I.T.T principle from ACSM for exercise prescriptions.  Reviews F.I.T.T. principles of aerobic exercise including progression. Written material given at graduation. Flowsheet Row Cardiac Rehab from 03/16/2024 in [facility] Cardiac and Pulmonary Rehab  Date 02/17/24  Educator MB  Instruction Review Code 1- Verbalizes Understanding    Education: Resistance Exercise: - Group verbal and visual presentation on the components of exercise prescription. Introduces F.I.T.T principle from ACSM for exercise prescriptions  Reviews F.I.T.T. principles of resistance exercise including progression. Written material given at graduation. Flowsheet Row Cardiac Rehab from 03/16/2024 in [facility] Cardiac and Pulmonary Rehab  Date 02/10/24  Educator MB  Instruction Review Code 1- [employer] Understanding     Education: Exercise & Equipment Safety: - Individual verbal instruction and demonstration of equipment use and safety with use of the equipment. Flowsheet Row Cardiac Rehab from 03/16/2024 in [facility] Cardiac and Pulmonary Rehab  Date 12/29/23  Educator NT  Instruction Review Code 1- Verbalizes Understanding    Education: Exercise Physiology & General Exercise Guidelines: - Group verbal and written instruction with models to review the exercise physiology of the cardiovascular system and associated critical values. Provides general exercise guidelines with specific guidelines to those with heart or lung disease.  Flowsheet Row Cardiac Rehab from 03/16/2024 in [facility] Cardiac and Pulmonary Rehab  Date 02/03/24  Educator MB  Instruction Review Code 1- [employer] Understanding    Education: Flexibility, Balance, Mind/Body Relaxation: - Group verbal and visual presentation with interactive activity on the components of exercise prescription. Introduces F.I.T.T principle from ACSM for exercise prescriptions. Reviews F.I.T.T. principles of flexibility and balance exercise training  including progression. Also discusses the mind body connection.  Reviews various relaxation techniques to help reduce and manage stress (i.e. Deep breathing, progressive muscle relaxation, and visualization). Balance handout provided to take home. Written material given at graduation. Flowsheet Row Cardiac Rehab from 03/16/2024 in [facility] Cardiac and Pulmonary Rehab  Date 02/10/24  Educator MB  Instruction Review Code 1- Verbalizes Understanding    Activity Barriers & Risk Stratification:  Activity Barriers & Cardiac Risk Stratification - 12/29/23 1122       Activity Barriers & Cardiac Risk Stratification   Activity Barriers Back Problems    Cardiac Risk  Stratification High          6 Minute Walk:  6 Minute Walk     Row Name 12/29/23 1120 03/13/24 0926       6 Minute Walk   Phase Initial Discharge    Distance 1330 feet 1420 feet    Distance % Change -- 7 %    Distance Feet Change -- 90 ft    Walk Time 6 minutes 6 minutes    # of Rest Breaks 0 0    MPH 2.52 2.69    METS 3.49 3.73    RPE 9 11    Perceived Dyspnea  1 1    VO2 Peak 12.22 13.05    Symptoms No No    Resting HR 52 bpm 65 bpm    Resting BP 124/58 122/82    Resting Oxygen Saturation  99 % --    Exercise Oxygen Saturation  during 6 min walk 99 % --    Max Ex. HR 62 bpm 68 bpm    Max Ex. BP 128/60 128/82    2 Minute Post BP 126/64 120/80       Oxygen Initial Assessment:   Oxygen Re-Evaluation:   Oxygen Discharge (Final Oxygen Re-Evaluation):   Initial Exercise Prescription:  Initial Exercise Prescription - 12/29/23 1100       Date of Initial Exercise RX and Referring Provider   Date 12/29/23    Referring Provider Dr. Marsa Dooms, MD      Oxygen   Maintain Oxygen Saturation 88% or higher      Treadmill   MPH 2.6    Grade 1    Minutes 15    METs 3.35      NuStep   Level 3   T6 nustep   SPM 80    Minutes 15    METs 3.49      REL-XR   Level 3    Speed 50    Minutes 15    METs  3.49      Prescription Details   Frequency (times per week) 3    Duration Progress to 30 minutes of continuous aerobic without signs/symptoms of physical distress      Intensity   THRR 40-80% of Max Heartrate 95-139    Ratings of Perceived Exertion 11-13    Perceived Dyspnea 0-4      Progression   Progression Continue to progress workloads to maintain intensity without signs/symptoms of physical distress.      Resistance Training   Training Prescription Yes    Weight 4 lb    Reps 10-15          Perform Capillary Blood Glucose checks as needed.  Exercise Prescription Changes:   Exercise Prescription Changes     Row Name 12/29/23 1100 01/12/24 1300 01/25/24 1500 02/10/24 1500 02/22/24 1400     Response to Exercise   Blood Pressure (Admit) 124/58 120/58 122/60 118/72 122/70   Blood Pressure (Exercise) 128/60 144/62 128/64 132/54 124/82   Blood Pressure (Exit) 126/64 108/50 122/62 128/64 100/64   Heart Rate (Admit) 52 bpm 60 bpm 66 bpm 63 bpm 62 bpm   Heart Rate (Exercise) 62 bpm 78 bpm 75 bpm 81 bpm 77 bpm   Heart Rate (Exit) 52 bpm 59 bpm 68 bpm 52 bpm 57 bpm   Oxygen Saturation (Admit) 99 % -- -- -- --   Oxygen Saturation (Exercise) 99 % -- -- -- --   Rating of Perceived Exertion (Exercise) 9 13  13 13 13    Perceived Dyspnea (Exercise) 1 0 0 -- --   Symptoms none none none none none   Comments Results First 2 weeks of exercise -- -- --   Duration -- Progress to 30 minutes of  aerobic without signs/symptoms of physical distress Progress to 30 minutes of  aerobic without signs/symptoms of physical distress Continue with 30 min of aerobic exercise without signs/symptoms of physical distress. Continue with 30 min of aerobic exercise without signs/symptoms of physical distress.   Intensity -- THRR unchanged THRR unchanged THRR unchanged THRR unchanged     Progression   Progression -- Continue to progress workloads to maintain intensity without signs/symptoms of physical  distress. Continue to progress workloads to maintain intensity without signs/symptoms of physical distress. Continue to progress workloads to maintain intensity without signs/symptoms of physical distress. Continue to progress workloads to maintain intensity without signs/symptoms of physical distress.   Average METs -- 3.16 3.22 3.37 3.02     Resistance Training   Training Prescription -- Yes Yes Yes Yes   Weight -- 4 4 4  lb 4 lb   Reps -- 10-15 10-15 10-15 10-15     Interval Training   Interval Training -- No No No No     Treadmill   MPH -- 2.5 2.9 2.3 2.3   Grade -- 1 1.5 1.5 3   Minutes -- 15 15 15 15    METs -- 3.26 3.82 3.23 3.71     NuStep   Level -- -- 3 -- --   Minutes -- -- 15 -- --   METs -- -- 2.2 -- --     REL-XR   Level -- 3 5 2 4    Minutes -- 15 15 15 15    METs -- 3.1 3.6 3.5 3.9     T5 Nustep   Level -- -- -- -- 4   Minutes -- -- -- -- 15   METs -- -- -- -- 2.47     Oxygen   Maintain Oxygen Saturation -- 88% or higher 88% or higher 88% or higher 88% or higher    Row Name 03/07/24 1400 03/09/24 1000           Response to Exercise   Blood Pressure (Admit) 126/68 --      Blood Pressure (Exit) 122/70 --      Heart Rate (Admit) 66 bpm --      Heart Rate (Exercise) 80 bpm --      Heart Rate (Exit) 51 bpm --      Rating of Perceived Exertion (Exercise) 13 --      Symptoms none --      Duration Continue with 30 min of aerobic exercise without signs/symptoms of physical distress. --      Intensity THRR unchanged --        Progression   Progression Continue to progress workloads to maintain intensity without signs/symptoms of physical distress. --      Average METs 3.51 --        Resistance Training   Training Prescription Yes --      Weight 4 lb --      Reps 10-15 --        Interval Training   Interval Training No --        Treadmill   MPH 2.3 --      Grade 5 --      Minutes 15 --      METs 4.35 --  REL-XR   Level 4 --      Minutes 15  --      METs 3.9 --        T5 Nustep   Level 4 --      Minutes 15 --      METs 2.4 --        Home Exercise Plan   Plans to continue exercise at -- Home (comment)  [name] plans to continue to walk outside around his house. 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      Frequency -- Add 2 additional days to program exercise sessions.      Initial Home Exercises Provided -- 03/09/24        Oxygen   Maintain Oxygen Saturation 88% or higher --         Exercise Comments:   Exercise Comments     Row Name 01/03/24 0936           Exercise Comments First full day of exercise!  Patient was oriented to gym and equipment including functions, settings, policies, and procedures.  Patient's individual exercise prescription and treatment plan were reviewed.  All starting workloads were established based on the results of the 6 minute walk test done at initial orientation visit.  The plan for exercise progression was also introduced and progression will be customized based on patient's performance and goals.          Exercise Goals and Review:   Exercise Goals     Row Name 12/29/23 1122             Exercise Goals   Increase Physical Activity Yes       Intervention Develop an individualized exercise prescription for aerobic and resistive training based on initial evaluation findings, risk stratification, comorbidities and participant's personal goals.;Provide advice, education, support and counseling about physical activity/exercise needs.       Expected Outcomes Short Term: Attend rehab on a regular basis to increase amount of physical activity.;Long Term: Add in home exercise to make exercise part of routine and to increase amount of physical activity.;Long Term: Exercising regularly at least 3-5 days a week.       Increase Strength and Stamina Yes       Intervention Provide advice, education, support and counseling about physical activity/exercise needs.;Develop an  individualized exercise prescription for aerobic and resistive training based on initial evaluation findings, risk stratification, comorbidities and participant's personal goals.       Expected Outcomes Short Term: Increase workloads from initial exercise prescription for resistance, speed, and METs.;Short Term: Perform resistance training exercises routinely during rehab and add in resistance training at home;Long Term: Improve cardiorespiratory fitness, muscular endurance and strength as measured by increased METs and functional capacity ( )       Able to understand and use rate of perceived exertion (RPE) scale Yes       Intervention Provide education and explanation on how to use RPE scale       Expected Outcomes Short Term: Able to use RPE daily in rehab to express subjective intensity level;Long Term:  Able to use RPE to guide intensity level when exercising independently       Able to understand and use Dyspnea scale Yes       Intervention Provide education and explanation on how to use Dyspnea scale       Expected Outcomes Short Term: Able to use Dyspnea scale daily in rehab to express subjective sense of  shortness of breath during exertion;Long Term: Able to use Dyspnea scale to guide intensity level when exercising independently       Knowledge and understanding of Target Heart Rate Range (THRR) Yes       Intervention Provide education and explanation of THRR including how the numbers were predicted and where they are located for reference       Expected Outcomes Short Term: Able to state/look up THRR;Long Term: Able to use THRR to govern intensity when exercising independently;Short Term: Able to use daily as guideline for intensity in rehab       Able to check pulse independently Yes       Intervention Review the importance of being able to check your own pulse for safety during independent exercise;Provide education and demonstration on how to check pulse in carotid and radial arteries.        Expected Outcomes Short Term: Able to explain why pulse checking is important during independent exercise;Long Term: Able to check pulse independently and accurately       Understanding of Exercise Prescription Yes       Intervention Provide education, explanation, and written materials on patient's individual exercise prescription       Expected Outcomes Long Term: Able to explain home exercise prescription to exercise independently;Short Term: Able to explain program exercise prescription          Exercise Goals Re-Evaluation :  Exercise Goals Re-Evaluation     Row Name 01/03/24 0936 01/12/24 1351 01/13/24 0924 01/25/24 1541 02/08/24 0935     Exercise Goal Re-Evaluation   Exercise Goals Review Able to understand and use rate of perceived exertion (RPE) scale;Knowledge and understanding of Target Heart Rate Range (THRR);Able to understand and use Dyspnea scale;Understanding of Exercise Prescription Increase Physical Activity;Increase Strength and Stamina;Understanding of Exercise Prescription Increase Physical Activity;Increase Strength and Stamina;Understanding of Exercise Prescription Increase Physical Activity;Increase Strength and Stamina;Understanding of Exercise Prescription Increase Physical Activity;Increase Strength and Stamina;Understanding of Exercise Prescription   Comments Reviewed RPE and dyspnea scale, THR and program prescription with pt today.  Pt voiced understanding and was given a copy of goals to take home. [name] is off to a great start in the program. He was able to attend his first 2 sessions during this review. He was also able to use the treadmill at 2. at a 1% incline, and the XR at level 3. We will continue to monitor his progress in the program. [name] reports he is doing well with exercise here at rehab. He has done the treadmill and XR machines. Today he will try the T6. Encouraged him to increase workload as able. For exercise outside of rehab he says he is outside  working with his chicken and is active with his dogs. [name] is doing well in rehab. He was able to increase his treadmill workload to a speed of 2. and 1.5% incline. He was also able to increase from level 3 to 5 on the XR. We will continue to monitor his progress in the program. [name] is doing well in rehab. He walks a lot at home is active with yard work. Encouraged him to work on increasing workload while he is here at rehab.   Expected Outcomes Short: Use RPE daily to regulate intensity. Long: Follow program prescription in THR. Short: Continue to follow exercise prescription. Long: Continue exercise to improve strength and stamina. STg: Continue to follow exercise prescription and increase workloads as able. Continue exercise to improve strength and stamina. Short: Continue  to increase treadmill workload. Long: Continue exercise to improve strength and stamina. STG: Increase workload as able. LTG: Continue exercise to improve strength and stamina.    Row Name 02/10/24 1559 02/22/24 1424 03/07/24 1423 03/09/24 1041       Exercise Goal Re-Evaluation   Exercise Goals Review Increase Physical Activity;Increase Strength and Stamina;Understanding of Exercise Prescription Increase Physical Activity;Increase Strength and Stamina;Understanding of Exercise Prescription Increase Physical Activity;Increase Strength and Stamina;Understanding of Exercise Prescription Increase Physical Activity;Increase Strength and Stamina;Able to understand and use rate of perceived exertion (RPE) scale;Able to understand and use Dyspnea scale;Knowledge and understanding of Target Heart Rate Range (THRR);Able to check pulse independently;Understanding of Exercise Prescription    Comments [name] has only attended one session of rehab since the last review. His speed on the treadmill decreased down to 2.3 mph. He also decreased to level 2 on the XR. We will continue to monitor his progress in the program. [name] continues to do well in  rehab. He recently increased his treadmill workload by increasing his incline to 3% while maintaining his speed at 2.3 mph. He also improved back up to level 4 on the XR and began using the T5 nustep at level 4. We will continue to monitor his progress in the program. [name] continues to do well in rehab. He is due for his post and hopes to improve. He was able to increase his treadmill workload by increasing his incline to 5% while maitaining a speed of 2.3 mph. He maintained level 4 on the XR and T5 nustep. We will continue to monitor his progress in the program. Reviewed home exercise with pt today.  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  Reviewed THR, pulse, RPE, sign and symptoms, pulse oximetery and when to call 911 or MD.  Also discussed weather considerations and indoor options.  Pt voiced understanding.    Expected Outcomes Short: Attend rehab more consistently. Long: Continue exercise to improve strength and stamina. Short: Continue to progressively increase treadmill workload. Long: Continue exercise to improve strength and stamina. Short: Improve on post . Long: Continue to increase overall METs and stamina. Short: Implement home exercise. Long: Continue to exercise at home and in the program to improve strength and stamina.       Discharge Exercise Prescription (Final Exercise Prescription Changes):  Exercise Prescription Changes - 03/09/24 1000       Home Exercise Plan   Plans to continue exercise at Home (comment)   [name] plans to continue to walk outside around his house. 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   Frequency Add 2 additional days to program exercise sessions.    Initial Home Exercises Provided 03/09/24          Nutrition:  Target Goals: Understanding of nutrition guidelines, daily intake of sodium 1500mg , cholesterol 200mg , calories 30% from fat and 7% or less from saturated  fats, daily to have 5 or more servings of fruits and vegetables.  Education: All About Nutrition: -Group instruction provided by verbal, written material, interactive activities, discussions, models, and posters to present general guidelines for heart healthy nutrition including fat, fiber, MyPlate, the role of sodium in heart healthy nutrition, utilization of the nutrition label, and utilization of this knowledge for meal planning. Follow up email sent as well. Written material given at graduation. Flowsheet Row Cardiac Rehab from 03/16/2024 in [facility] Cardiac and Pulmonary Rehab  Date 02/24/24  Educator JG  Instruction Review Code 1- Verbalizes Understanding    Biometrics:  Pre Biometrics - 12/29/23 1123       Pre Biometrics   Height 5' 9.5 (1.765 m)    Weight 157 lb 11.2 oz (71.5 kg)    Waist Circumference 34 inches    Hip Circumference 36 inches    Waist to Hip Ratio 0.94 %    BMI (Calculated) 22.96    Single Leg Stand 23.7 seconds          Post Biometrics - 03/13/24 0928        Post  Biometrics   Height 5' 9.5 (1.765 m)    Weight 155 lb 1.6 oz (70.4 kg)    Waist Circumference 34 inches    Hip Circumference 36 inches    Waist to Hip Ratio 0.94 %    BMI (Calculated) 22.58    Single Leg Stand 17.66 seconds          Nutrition Therapy Plan and Nutrition Goals:  Nutrition Therapy & Goals - 12/29/23 1346       Nutrition Therapy   Diet Cardiac, Low Na    Protein (specify units) 75-90    Fiber 30 grams    Whole Grain Foods 3 servings    Saturated Fats 15 max. grams    Fruits and Vegetables 5 servings/day    Sodium 2 grams      Personal Nutrition Goals   Nutrition Goal Read labels and reduce sodium intake to below 2300mg . Ideally 1500mg  per day.    Personal Goal #2 Reduce saturated fat, less than 12g per day. Replace bad fats for more heart healthy fats.    Personal Goal #3 Eat 15-30gProtein and 30-60gCarbs at each meal.    Comments Patient drinking ~32oz of water   and ~32oz of regular soda. Spoke with him about drinking sugary and calorie dense beverages. He feels its part of his morning routine and would struggle with energy without it. His A1C is normal. Reviewed mediterranean diet handout. Educated on types of fats, sources and how to read label. Reviewed balanced plates handout. Discussed the importance of including colorful produce at plates. Explained the benefits of pairing carbs with protein or healthy fats. Brainstormed meal and snacks ideas with focus on variety while keeping sodium and saturated fat intake below the limits provided of 1500mg  and 12g respectively      Intervention Plan   Intervention Prescribe, educate and counsel regarding individualized specific dietary modifications aiming towards targeted core components such as weight, hypertension, lipid management, diabetes, heart failure and other comorbidities.;Nutrition handout(s) given to patient.    Expected Outcomes Long Term Goal: Adherence to prescribed nutrition plan.;Short Term Goal: A plan has been developed with personal nutrition goals set during dietitian appointment.;Short Term Goal: Understand basic principles of dietary content, such as calories, fat, sodium, cholesterol and nutrients.          Nutrition Assessments:  MEDIFICTS Score Key: >=70 Need to make dietary changes  40-70 Heart Healthy Diet <= 40 Therapeutic Level Cholesterol Diet  Flowsheet Row Cardiac Rehab from 03/14/2024 in [facility] Cardiac and Pulmonary Rehab  Picture Your Plate Total Score on Admission 63  Picture Your Plate Total Score on Discharge 50   Picture Your Plate Scores: <59 Unhealthy dietary pattern with much room for improvement. 41-50 Dietary pattern unlikely to meet recommendations for good health and room for improvement. 51-60 More healthful dietary pattern, with some room for improvement.  >60 Healthy dietary pattern, although there  may be some specific behaviors that could be improved.     Nutrition Goals Re-Evaluation:  Nutrition Goals Re-Evaluation     Row Name 01/13/24 0933 02/08/24 9062 03/07/24 0921         Goals   Comment [name] reports he feels he is doing well with nutrition. He says his portions are small but he includes protein at his meals. Reminded him to read labels and keep sodium controlled, below 1500mg  ideally. [name] says he is doing well with nutrition. Eats most homecooked meals and rarely eats out and tries to stay away from processed foods. [name] states that he is doing well with his diet at this time. He reports not eating out often and limiting processed foods. He continues to read labels and reduce sodium. He is also using sunflower oil to reduce saturated fat and replace bad fats for more healthy fats.     Expected Outcome STG: limit sodium below 1500mg . LTG: Follow a heart healthy diet that supports bodies needs STG: Continue to eat healthy meals at home. LTG: Follow a heart healthy diet that supports bodies needs Short: Continue to limit sodium and saturated fat. Long: Continue to practice heart healthy diet discussed with RD.        Nutrition Goals Discharge (Final Nutrition Goals Re-Evaluation):  Nutrition Goals Re-Evaluation - 03/07/24 0921       Goals   Comment Kaidon states that he is doing well with his diet at this time. He reports not eating out often and limiting processed foods. He continues to read labels and reduce sodium. He is also using sunflower oil to reduce saturated fat and replace bad fats for more healthy fats.    Expected Outcome Short: Continue to limit sodium and saturated fat. Long: Continue to practice heart healthy diet discussed with RD.          Psychosocial: Target Goals: Acknowledge presence or absence of significant depression and/or stress, maximize coping skills, provide positive support system. Participant is able to verbalize types and ability to use techniques and skills needed for reducing stress and depression.    Education: Stress, Anxiety, and Depression - Group verbal and visual presentation to define topics covered.  Reviews how body is impacted by stress, anxiety, and depression.  Also discusses healthy ways to reduce stress and to treat/manage anxiety and depression.  Written material given at graduation.   Education: Sleep Hygiene -Provides group verbal and written instruction about how sleep can affect your health.  Define sleep hygiene, discuss sleep cycles and impact of sleep habits. Review good sleep hygiene tips.    Initial Review & Psychosocial Screening:  Initial Psych Review & Screening - 12/23/23 1446       Initial Review   Current issues with None Identified      Family Dynamics   Good Support System? Yes   wife nad brother     Barriers   Psychosocial barriers to participate in program There are no identifiable barriers or psychosocial needs.      Screening Interventions   Interventions Encouraged to exercise;To provide support and resources with identified psychosocial needs;Provide feedback about the scores to participant    Expected Outcomes Short Term goal: Utilizing psychosocial counselor, staff and physician to assist with identification of specific Stressors or current issues interfering with healing process. Setting desired goal for each stressor or current issue identified.;Long Term Goal: Stressors or current issues are controlled or eliminated.;Short Term goal: Identification and review with participant of any Quality of  Life or Depression concerns found by scoring the questionnaire.;Long Term goal: The participant improves quality of Life and PHQ9 Scores as seen by post scores and/or verbalization of changes          Quality of Life Scores:   Quality of Life - 03/14/24 0950       Quality of Life Scores   Health/Function Post 28.61 %    Socioeconomic Post 25.75 %    Psych/Spiritual Post 30 %    Family Post 24.75 %    GLOBAL Post 27.87 %         Scores  of 19 and below usually indicate a poorer quality of life in these areas.  A difference of  2-3 points is a clinically meaningful difference.  A difference of 2-3 points in the total score of the Quality of Life Index has been associated with significant improvement in overall quality of life, self-image, physical symptoms, and general health in studies assessing change in quality of life.  PHQ-9: Review Flowsheet       03/14/2024 12/29/2023 05/19/2022  Depression screen PHQ 2/9  Decreased Interest 0 0 0  Down, Depressed, Hopeless 0 0 0  PHQ - 2 Score 0 0 0  Altered sleeping 0 1 -  Tired, decreased energy 0 0 -  Change in appetite 0 0 -  Feeling bad or failure about yourself  0 0 -  Trouble concentrating 0 0 -  Moving slowly or fidgety/restless 0 0 -  Suicidal thoughts 0 0 -  PHQ-9 Score 0 1 -  Difficult doing work/chores Not difficult at all Not difficult at all -   Interpretation of Total Score  Total Score Depression Severity:  1-4 = Minimal depression, 5-9 = Mild depression, 10-14 = Moderate depression, 15-19 = Moderately severe depression, 20-27 = Severe depression   Psychosocial Evaluation and Intervention:  Psychosocial Evaluation - 12/23/23 1502       Psychosocial Evaluation & Interventions   Interventions Encouraged to exercise with the program and follow exercise prescription    Comments There are o barriers to attending the program.  HE wishes to continue to heal and get back to his lawn mower, 4 whellers and usual activities.   He lives with his wife and she is his support.   He is ready to start the program.    Expected Outcomes STG attend all scheduled sessions, follow exercise progression guidelines.  LTG continues with exercise progression and nutriton plan to continue to work on heart healthy lifestyle    Continue Psychosocial Services  Follow up required by staff          Psychosocial Re-Evaluation:  Psychosocial Re-Evaluation     Row Name 01/13/24 [phone]  02/08/24 0936 03/07/24 0924         Psychosocial Re-Evaluation   Current issues with None Identified None Identified None Identified     Comments Gradie denies any anxiety, depression or stress. Says he has a good support system and sleeps well. Reports he gets ~7hrs per night and wakes up well rested. [name] denies any stress, depression or aniety. He says he has a good support system. Reports he is getting ~7hrs per night and wakes up well rested. [name] reports no major stressors at this time. He states that enjoys coming to rehab for the exercise and environment. He reports that he has a good support system made up by his family, dogs, and chickens. He has no issues sleeping at night.     Expected  Outcomes STG: Continue to attend cardiac rehab. LTG: achieve and maintain a positive outlook on health and daily life STG: Continue to attend cardiac rehab. LTG: achieve and maintain a positive outlook on health and daily life Short: Continue to attend rehab for exercise and mental boost. Long: Maintain positive outlook.     Interventions Encouraged to attend Cardiac Rehabilitation for the exercise Encouraged to attend Cardiac Rehabilitation for the exercise Encouraged to attend Cardiac Rehabilitation for the exercise     Continue Psychosocial Services  Follow up required by staff Follow up required by staff Follow up required by staff        Psychosocial Discharge (Final Psychosocial Re-Evaluation):  Psychosocial Re-Evaluation - 03/07/24 0924       Psychosocial Re-Evaluation   Current issues with None Identified    Comments [name] reports no major stressors at this time. He states that enjoys coming to rehab for the exercise and environment. He reports that he has a good support system made up by his family, dogs, and chickens. He has no issues sleeping at night.    Expected Outcomes Short: Continue to attend rehab for exercise and mental boost. Long: Maintain positive outlook.    Interventions Encouraged  to attend Cardiac Rehabilitation for the exercise    Continue Psychosocial Services  Follow up required by staff          Vocational Rehabilitation: Provide vocational rehab assistance to qualifying candidates.   Vocational Rehab Evaluation & Intervention:   Education: Education Goals: Education classes will be provided on a variety of topics geared toward better understanding of heart health and risk factor modification. Participant will state understanding/return demonstration of topics presented as noted by education test scores.  Learning Barriers/Preferences:   General Cardiac Education Topics:  AED/CPR: - Group verbal and written instruction with the use of models to demonstrate the basic use of the AED with the basic ABC's of resuscitation.   Anatomy and Cardiac Procedures: - Group verbal and visual presentation and models provide information about basic cardiac anatomy and function. Reviews the testing methods done to diagnose heart disease and the outcomes of the test results. Describes the treatment choices: Medical Management, Angioplasty, or Coronary Bypass Surgery for treating various heart conditions including Myocardial Infarction, Angina, Valve Disease, and Cardiac Arrhythmias.  Written material given at graduation.   Medication Safety: - Group verbal and visual instruction to review commonly prescribed medications for heart and lung disease. Reviews the medication, class of the drug, and side effects. Includes the steps to properly store meds and maintain the prescription regimen.  Written material given at graduation. Flowsheet Row Cardiac Rehab from 03/16/2024 in [facility] Cardiac and Pulmonary Rehab  Date 03/16/24  Educator [facility]  Instruction Review Code 1- Verbalizes Understanding    Intimacy: - Group verbal instruction through game format to discuss how heart and lung disease can affect sexual intimacy. Written material given at graduation.. Flowsheet Row Cardiac  Rehab from 03/16/2024 in [facility] Cardiac and Pulmonary Rehab  Date 02/17/24  Educator MB  Instruction Review Code 1- Verbalizes Understanding    Know Your Numbers and Heart Failure: - Group verbal and visual instruction to discuss disease risk factors for cardiac and pulmonary disease and treatment options.  Reviews associated critical values for Overweight/Obesity, Hypertension, Cholesterol, and Diabetes.  Discusses basics of heart failure: signs/symptoms and treatments.  Introduces Heart Failure Zone chart for action plan for heart failure.  Written material given at graduation. Flowsheet Row Cardiac Rehab from 03/16/2024 in [facility] Cardiac and  Pulmonary Rehab  Date 01/13/24  Educator SB  Instruction Review Code 1- Verbalizes Understanding    Infection Prevention: - Provides verbal and written material to individual with discussion of infection control including proper hand washing and proper equipment cleaning during exercise session. Flowsheet Row Cardiac Rehab from 03/16/2024 in [facility] Cardiac and Pulmonary Rehab  Date 12/29/23  Educator NT  Instruction Review Code 1- Verbalizes Understanding    Falls Prevention: - Provides verbal and written material to individual with discussion of falls prevention and safety. Flowsheet Row Cardiac Rehab from 03/16/2024 in [facility] Cardiac and Pulmonary Rehab  Date 12/23/23  Educator SB  Instruction Review Code 1- Verbalizes Understanding    Other: -Provides group and verbal instruction on various topics (see comments)   Knowledge Questionnaire Score:  Knowledge Questionnaire Score - 03/14/24 0951       Knowledge Questionnaire Score   Post Score 22/26          Core Components/Risk Factors/Patient Goals at Admission:  Personal Goals and Risk Factors at Admission - 12/23/23 1447       Core Components/Risk Factors/Patient Goals on Admission    Weight Management Yes    Intervention Weight Management: Develop a combined nutrition and exercise  program designed to reach desired caloric intake, while maintaining appropriate intake of nutrient and fiber, sodium and fats, and appropriate energy expenditure required for the weight goal.;Weight Management: Provide education and appropriate resources to help participant work on and attain dietary goals.    Admit Weight 154 lb (69.9 kg)    Goal Weight: Short Term 155 lb (70.3 kg)    Goal Weight: Long Term 178 lb (80.7 kg)    Expected Outcomes Short Term: Continue to assess and modify interventions until short term weight is achieved;Long Term: Adherence to nutrition and physical activity/exercise program aimed toward attainment of established weight goal;Weight Gain: Understanding of general recommendations for a high calorie, high protein meal plan that promotes weight gain by distributing calorie intake throughout the day with the consumption for 4-5 meals, snacks, and/or supplements    Hypertension Yes    Intervention Provide education on lifestyle modifcations including regular physical activity/exercise, weight management, moderate sodium restriction and increased consumption of fresh fruit, vegetables, and low fat dairy, alcohol moderation, and smoking cessation.;Monitor prescription use compliance.    Expected Outcomes Short Term: Continued assessment and intervention until BP is < 140/84mm HG in hypertensive participants. < 130/30mm HG in hypertensive participants with diabetes, heart failure or chronic kidney disease.;Long Term: Maintenance of blood pressure at goal levels.    Lipids Yes    Intervention Provide education and support for participant on nutrition & aerobic/resistive exercise along with prescribed medications to achieve LDL 70mg , HDL >40mg .    Expected Outcomes Short Term: Participant states understanding of desired cholesterol values and is compliant with medications prescribed. Participant is following exercise prescription and nutrition guidelines.;Long Term: Cholesterol  controlled with medications as prescribed, with individualized exercise RX and with personalized nutrition plan. Value goals: LDL < 70mg , HDL > 40 mg.          Education:Diabetes - Individual verbal and written instruction to review signs/symptoms of diabetes, desired ranges of glucose level fasting, after meals and with exercise. Acknowledge that pre and post exercise glucose checks will be done for 3 sessions at entry of program.   Core Components/Risk Factors/Patient Goals Review:   Goals and Risk Factor Review     Row Name 01/13/24 [phone] 02/08/24 9060 03/07/24 [phone]  Core Components/Risk Factors/Patient Goals Review   Personal Goals Review Hypertension Hypertension Hypertension     Review Trejuan reports he does check his blood pressure at home. Says it consistently matches the readings her at rehab. Encouraged him to continue to check his BP at home and let the rehab team if there are any changes in readings Donne reports he checks his blood pressure at home about once per week. Encouraged him to check a few times per week. More if readings are concerning. Jens checks his BP at home a couple of times each week. He states that his BP readings have been within normal ranges and consistent with his readings in rehab. He continues to take all of his medications as prescribed.     Expected Outcomes STG: Check BP at home. LTG: manage risk factors independently STG: Check BP at home 4-5 times per week. LTG: manage risk factors independently Short: Continue to monitor BP readings at home. Long: Continue to manage lifestyle risk factors.        Core Components/Risk Factors/Patient Goals at Discharge (Final Review):   Goals and Risk Factor Review - 03/07/24 0927       Core Components/Risk Factors/Patient Goals Review   Personal Goals Review Hypertension    Review Bader checks his BP at home a couple of times each week. He states that his BP readings have been within normal ranges and  consistent with his readings in rehab. He continues to take all of his medications as prescribed.    Expected Outcomes Short: Continue to monitor BP readings at home. Long: Continue to manage lifestyle risk factors.          ITP Comments:  ITP Comments     Row Name 12/23/23 1501 12/29/23 1130 01/03/24 0935 01/26/24 1329 02/23/24 0958   ITP Comments Virtual orientation call completed today. he has an appointment on Date: 12/29/2023  for EP eval and gym Orientation.  Documentation of diagnosis can be found in [facility] 11/08/2023 . Completed and gym orientation for cardiac rehab. Initial ITP created and sent for review to Dr. Oneil Pinal, Medical Director. First full day of exercise!  Patient was oriented to gym and equipment including functions, settings, policies, and procedures.  Patient's individual exercise prescription and treatment plan were reviewed.  All starting workloads were established based on the results of the 6 minute walk test done at initial orientation visit.  The plan for exercise progression was also introduced and progression will be customized based on patient's performance and goals. 30 Day review completed. Medical Director ITP review done, changes made as directed, and signed approval by Medical Director.    new to prpgram 30 Day review completed. Medical Director ITP review done, changes made as directed, and signed approval by Medical Director.    Row Name 03/22/24 1144           ITP Comments 30 Day review completed. Medical Director ITP review done, changes made as directed, and signed approval by Medical Director.          Comments: 30 day review

## 2024-03-23 ENCOUNTER — Encounter

## 2024-03-27 ENCOUNTER — Encounter

## 2024-03-28 ENCOUNTER — Encounter: Payer: Self-pay | Admitting: *Deleted

## 2024-03-28 ENCOUNTER — Encounter

## 2024-03-28 DIAGNOSIS — Z951 Presence of aortocoronary bypass graft: Secondary | ICD-10-CM

## 2024-03-28 NOTE — Progress Notes (Signed)
 Cardiac Individual Treatment Plan  Patient Details  Name: [name] MRN: [id number] Date of Birth: [birth year] Referring Provider:   Flowsheet Row Cardiac Rehab from 12/29/2023 in [facility] Cardiac and Pulmonary Rehab  Referring Provider Dr. Marsa Dooms, MD    Initial Encounter Date:  Flowsheet Row Cardiac Rehab from 12/29/2023 in [facility] Cardiac and Pulmonary Rehab  Date 12/29/23    Visit Diagnosis: S/P CABG x 3  Patient's Home Medications on Admission:  Current Outpatient Medications:    acetaminophen  (TYLENOL ) 325 MG tablet, Take 650 mg by mouth every 6 (six) hours as needed for moderate pain (pain score 4-6)., Disp: , Rfl:    amLODipine  (NORVASC ) 10 MG tablet, Take 10 mg by mouth daily., Disp: , Rfl:    aspirin  81 MG tablet, Take 81 mg by mouth daily. , Disp: , Rfl:    clopidogrel  (PLAVIX ) 75 MG tablet, Take 1 tablet (75 mg total) by mouth daily., Disp: 90 tablet, Rfl: 1   Coenzyme Q10 100 MG TABS, Take 100 mg by mouth daily., Disp: , Rfl:    furosemide  (LASIX ) 40 MG tablet, Take 1 tablet (40 mg total) by mouth daily. (Patient not taking: Reported on 02/17/2024), Disp: 7 tablet, Rfl: 0   loratadine  (CLARITIN ) 10 MG tablet, Take 1 tablet (10 mg total) by mouth daily., Disp: 90 tablet, Rfl: 1   metoprolol  succinate (TOPROL -XL) 100 MG 24 hr tablet, Take 1 tablet (100 mg total) by mouth daily. Take with or immediately following a meal., Disp: 30 tablet, Rfl: 1   Omega-3 Fatty Acids (FISH OIL PO), Take 690 mg by mouth daily., Disp: , Rfl:    oxyCODONE -acetaminophen  (PERCOCET) 5-325 MG tablet, Take 1 tablet by mouth every 6 (six) hours as needed for severe pain (pain score 7-10)., Disp: 12 tablet, Rfl: 0   pravastatin  (PRAVACHOL ) 40 MG tablet, Take 1 tablet (40 mg total) by mouth at bedtime., Disp: 30 tablet, Rfl: 2  Past Medical History: Past Medical History:  Diagnosis Date   CAD (coronary artery disease)    CABG x4 February 2025 by [facility]   Carotid artery occlusion     Dysrhythmia    post-CABG PAF   HLD (hyperlipidemia)    Hypertension    PVC (premature ventricular contraction)    Thrombocytopenia (HCC)     Tobacco Use: Social History   Tobacco Use  Smoking Status Former   Current packs/day: 0.00   Average packs/day: 0.3 packs/day for 25.0 years (6.3 ttl pk-yrs)   Types: Cigarettes   Start date: 09/1992   Quit date: 09/2017   Years since quitting: 6.5  Smokeless Tobacco Never    Labs: Review Flowsheet  More data exists      Latest Ref Rng & Units 05/27/2022 11/04/2023 11/08/2023 01/26/2024 03/18/2024  Labs for ITP Cardiac and Pulmonary Rehab  Cholestrol 0 - 200 mg/dL 816  - - 817  840   LDL (calc) 0 - 99 mg/dL 886  - - 879  97   HDL-C >40 mg/dL 43  - - 49  45   Trlycerides <150 mg/dL 844  - - 64  87   Hemoglobin A1c 4.8 - 5.6 % - 4.7  - - -  PH, Arterial 7.35 - 7.45 - - 7.314  7.316  7.425  7.367  7.385  7.358  7.401  - -  PCO2 arterial 32 - 48 mmHg - - 42.8  42.1  27.6  44.8  39.9  38.3  36.4  - -  Bicarbonate 20.0 -  28.0 mmol/L - - 21.6  21.4  18.4  25.7  23.9  22.1  21.6  22.6  - -  TCO2 22 - 32 mmol/L - - 23  23  19  24  27  25  25  23  23  24  22  24   - -  Acid-base deficit 0.0 - 2.0 mmol/L - - 4.0  4.0  6.0  1.0  3.0  4.0  2.0  - -  O2 Saturation % - - 99  98  99  100  100  82  100  100  - -    Details       Multiple values from one day are sorted in reverse-chronological order          Exercise Target Goals: Exercise Program Goal: Individual exercise prescription set using results from initial 6 min walk test and THRR while considering  patient's activity barriers and safety.   Exercise Prescription Goal: Initial exercise prescription builds to 30-45 minutes a day of aerobic activity, 2-3 days per week.  Home exercise guidelines will be given to patient during program as part of exercise prescription that the participant will acknowledge.   Education: Aerobic Exercise: - Group verbal and visual presentation on the  components of exercise prescription. Introduces F.I.T.T principle from ACSM for exercise prescriptions.  Reviews F.I.T.T. principles of aerobic exercise including progression. Written material given at graduation. Flowsheet Row Cardiac Rehab from 03/16/2024 in [facility] Cardiac and Pulmonary Rehab  Date 02/17/24  Educator MB  Instruction Review Code 1- Verbalizes Understanding    Education: Resistance Exercise: - Group verbal and visual presentation on the components of exercise prescription. Introduces F.I.T.T principle from ACSM for exercise prescriptions  Reviews F.I.T.T. principles of resistance exercise including progression. Written material given at graduation. Flowsheet Row Cardiac Rehab from 03/16/2024 in [facility] Cardiac and Pulmonary Rehab  Date 02/10/24  Educator MB  Instruction Review Code 1- [employer] Understanding     Education: Exercise & Equipment Safety: - Individual verbal instruction and demonstration of equipment use and safety with use of the equipment. Flowsheet Row Cardiac Rehab from 03/16/2024 in [facility] Cardiac and Pulmonary Rehab  Date 12/29/23  Educator NT  Instruction Review Code 1- Verbalizes Understanding    Education: Exercise Physiology & General Exercise Guidelines: - Group verbal and written instruction with models to review the exercise physiology of the cardiovascular system and associated critical values. Provides general exercise guidelines with specific guidelines to those with heart or lung disease.  Flowsheet Row Cardiac Rehab from 03/16/2024 in [facility] Cardiac and Pulmonary Rehab  Date 02/03/24  Educator MB  Instruction Review Code 1- [employer] Understanding    Education: Flexibility, Balance, Mind/Body Relaxation: - Group verbal and visual presentation with interactive activity on the components of exercise prescription. Introduces F.I.T.T principle from ACSM for exercise prescriptions. Reviews F.I.T.T. principles of flexibility and balance exercise training  including progression. Also discusses the mind body connection.  Reviews various relaxation techniques to help reduce and manage stress (i.e. Deep breathing, progressive muscle relaxation, and visualization). Balance handout provided to take home. Written material given at graduation. Flowsheet Row Cardiac Rehab from 03/16/2024 in [facility] Cardiac and Pulmonary Rehab  Date 02/10/24  Educator MB  Instruction Review Code 1- Verbalizes Understanding    Activity Barriers & Risk Stratification:  Activity Barriers & Cardiac Risk Stratification - 12/29/23 1122       Activity Barriers & Cardiac Risk Stratification   Activity Barriers Back Problems    Cardiac Risk  Stratification High          6 Minute Walk:  6 Minute Walk     Row Name 12/29/23 1120 03/13/24 0926       6 Minute Walk   Phase Initial Discharge    Distance 1330 feet 1420 feet    Distance % Change -- 7 %    Distance Feet Change -- 90 ft    Walk Time 6 minutes 6 minutes    # of Rest Breaks 0 0    MPH 2.52 2.69    METS 3.49 3.73    RPE 9 11    Perceived Dyspnea  1 1    VO2 Peak 12.22 13.05    Symptoms No No    Resting HR 52 bpm 65 bpm    Resting BP 124/58 122/82    Resting Oxygen Saturation  99 % --    Exercise Oxygen Saturation  during 6 min walk 99 % --    Max Ex. HR 62 bpm 68 bpm    Max Ex. BP 128/60 128/82    2 Minute Post BP 126/64 120/80       Oxygen Initial Assessment:   Oxygen Re-Evaluation:   Oxygen Discharge (Final Oxygen Re-Evaluation):   Initial Exercise Prescription:  Initial Exercise Prescription - 12/29/23 1100       Date of Initial Exercise RX and Referring Provider   Date 12/29/23    Referring Provider Dr. Marsa Dooms, MD      Oxygen   Maintain Oxygen Saturation 88% or higher      Treadmill   MPH 2.6    Grade 1    Minutes 15    METs 3.35      NuStep   Level 3   T6 nustep   SPM 80    Minutes 15    METs 3.49      REL-XR   Level 3    Speed 50    Minutes 15    METs  3.49      Prescription Details   Frequency (times per week) 3    Duration Progress to 30 minutes of continuous aerobic without signs/symptoms of physical distress      Intensity   THRR 40-80% of Max Heartrate 95-139    Ratings of Perceived Exertion 11-13    Perceived Dyspnea 0-4      Progression   Progression Continue to progress workloads to maintain intensity without signs/symptoms of physical distress.      Resistance Training   Training Prescription Yes    Weight 4 lb    Reps 10-15          Perform Capillary Blood Glucose checks as needed.  Exercise Prescription Changes:   Exercise Prescription Changes     Row Name 12/29/23 1100 01/12/24 1300 01/25/24 1500 02/10/24 1500 02/22/24 1400     Response to Exercise   Blood Pressure (Admit) 124/58 120/58 122/60 118/72 122/70   Blood Pressure (Exercise) 128/60 144/62 128/64 132/54 124/82   Blood Pressure (Exit) 126/64 108/50 122/62 128/64 100/64   Heart Rate (Admit) 52 bpm 60 bpm 66 bpm 63 bpm 62 bpm   Heart Rate (Exercise) 62 bpm 78 bpm 75 bpm 81 bpm 77 bpm   Heart Rate (Exit) 52 bpm 59 bpm 68 bpm 52 bpm 57 bpm   Oxygen Saturation (Admit) 99 % -- -- -- --   Oxygen Saturation (Exercise) 99 % -- -- -- --   Rating of Perceived Exertion (Exercise) 9 13  13 13 13    Perceived Dyspnea (Exercise) 1 0 0 -- --   Symptoms none none none none none   Comments Results First 2 weeks of exercise -- -- --   Duration -- Progress to 30 minutes of  aerobic without signs/symptoms of physical distress Progress to 30 minutes of  aerobic without signs/symptoms of physical distress Continue with 30 min of aerobic exercise without signs/symptoms of physical distress. Continue with 30 min of aerobic exercise without signs/symptoms of physical distress.   Intensity -- THRR unchanged THRR unchanged THRR unchanged THRR unchanged     Progression   Progression -- Continue to progress workloads to maintain intensity without signs/symptoms of physical  distress. Continue to progress workloads to maintain intensity without signs/symptoms of physical distress. Continue to progress workloads to maintain intensity without signs/symptoms of physical distress. Continue to progress workloads to maintain intensity without signs/symptoms of physical distress.   Average METs -- 3.16 3.22 3.37 3.02     Resistance Training   Training Prescription -- Yes Yes Yes Yes   Weight -- 4 4 4  lb 4 lb   Reps -- 10-15 10-15 10-15 10-15     Interval Training   Interval Training -- No No No No     Treadmill   MPH -- 2.5 2.9 2.3 2.3   Grade -- 1 1.5 1.5 3   Minutes -- 15 15 15 15    METs -- 3.26 3.82 3.23 3.71     NuStep   Level -- -- 3 -- --   Minutes -- -- 15 -- --   METs -- -- 2.2 -- --     REL-XR   Level -- 3 5 2 4    Minutes -- 15 15 15 15    METs -- 3.1 3.6 3.5 3.9     T5 Nustep   Level -- -- -- -- 4   Minutes -- -- -- -- 15   METs -- -- -- -- 2.47     Oxygen   Maintain Oxygen Saturation -- 88% or higher 88% or higher 88% or higher 88% or higher    Row Name 03/07/24 1400 03/09/24 1000 03/22/24 1400         Response to Exercise   Blood Pressure (Admit) 126/68 -- 142/64     Blood Pressure (Exit) 122/70 -- 112/64     Heart Rate (Admit) 66 bpm -- 67 bpm     Heart Rate (Exercise) 80 bpm -- 85 bpm     Heart Rate (Exit) 51 bpm -- 58 bpm     Rating of Perceived Exertion (Exercise) 13 -- 17     Symptoms none -- none     Duration Continue with 30 min of aerobic exercise without signs/symptoms of physical distress. -- Continue with 30 min of aerobic exercise without signs/symptoms of physical distress.     Intensity THRR unchanged -- THRR unchanged       Progression   Progression Continue to progress workloads to maintain intensity without signs/symptoms of physical distress. -- Continue to progress workloads to maintain intensity without signs/symptoms of physical distress.     Average METs 3.51 -- 3.6       Resistance Training   Training  Prescription Yes -- Yes     Weight 4 lb -- 4 lb     Reps 10-15 -- 10-15       Interval Training   Interval Training No -- No       Treadmill   MPH  2.3 -- 2.3     Grade 5 -- 5     Minutes 15 -- 15     METs 4.35 -- 4.35       Elliptical   Level -- -- 1     Speed -- -- 2     Minutes -- -- 15     METs -- -- 3.4       REL-XR   Level 4 -- 6     Minutes 15 -- 15     METs 3.9 -- 3.4       T5 Nustep   Level 4 -- 5     Minutes 15 -- 15     METs 2.4 -- 2.4       Home Exercise Plan   Plans to continue exercise at -- Home (comment)  [name] plans to continue to walk outside around his house. 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 Home (comment)  [name] plans to continue to walk outside around his house. 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     Frequency -- Add 2 additional days to program exercise sessions. Add 2 additional days to program exercise sessions.     Initial Home Exercises Provided -- 03/09/24 03/09/24       Oxygen   Maintain Oxygen Saturation 88% or higher -- 88% or higher        Exercise Comments:   Exercise Comments     Row Name 01/03/24 0936           Exercise Comments First full day of exercise!  Patient was oriented to gym and equipment including functions, settings, policies, and procedures.  Patient's individual exercise prescription and treatment plan were reviewed.  All starting workloads were established based on the results of the 6 minute walk test done at initial orientation visit.  The plan for exercise progression was also introduced and progression will be customized based on patient's performance and goals.          Exercise Goals and Review:   Exercise Goals     Row Name 12/29/23 1122             Exercise Goals   Increase Physical Activity Yes       Intervention Develop an individualized exercise prescription for aerobic and resistive training based on initial evaluation findings, risk stratification,  comorbidities and participant's personal goals.;Provide advice, education, support and counseling about physical activity/exercise needs.       Expected Outcomes Short Term: Attend rehab on a regular basis to increase amount of physical activity.;Long Term: Add in home exercise to make exercise part of routine and to increase amount of physical activity.;Long Term: Exercising regularly at least 3-5 days a week.       Increase Strength and Stamina Yes       Intervention Provide advice, education, support and counseling about physical activity/exercise needs.;Develop an individualized exercise prescription for aerobic and resistive training based on initial evaluation findings, risk stratification, comorbidities and participant's personal goals.       Expected Outcomes Short Term: Increase workloads from initial exercise prescription for resistance, speed, and METs.;Short Term: Perform resistance training exercises routinely during rehab and add in resistance training at home;Long Term: Improve cardiorespiratory fitness, muscular endurance and strength as measured by increased METs and functional capacity ( )       Able to understand and use rate of perceived exertion (RPE) scale Yes  Intervention Provide education and explanation on how to use RPE scale       Expected Outcomes Short Term: Able to use RPE daily in rehab to express subjective intensity level;Long Term:  Able to use RPE to guide intensity level when exercising independently       Able to understand and use Dyspnea scale Yes       Intervention Provide education and explanation on how to use Dyspnea scale       Expected Outcomes Short Term: Able to use Dyspnea scale daily in rehab to express subjective sense of shortness of breath during exertion;Long Term: Able to use Dyspnea scale to guide intensity level when exercising independently       Knowledge and understanding of Target Heart Rate Range (THRR) Yes       Intervention Provide  education and explanation of THRR including how the numbers were predicted and where they are located for reference       Expected Outcomes Short Term: Able to state/look up THRR;Long Term: Able to use THRR to govern intensity when exercising independently;Short Term: Able to use daily as guideline for intensity in rehab       Able to check pulse independently Yes       Intervention Review the importance of being able to check your own pulse for safety during independent exercise;Provide education and demonstration on how to check pulse in carotid and radial arteries.       Expected Outcomes Short Term: Able to explain why pulse checking is important during independent exercise;Long Term: Able to check pulse independently and accurately       Understanding of Exercise Prescription Yes       Intervention Provide education, explanation, and written materials on patient's individual exercise prescription       Expected Outcomes Long Term: Able to explain home exercise prescription to exercise independently;Short Term: Able to explain program exercise prescription          Exercise Goals Re-Evaluation :  Exercise Goals Re-Evaluation     Row Name 01/03/24 0936 01/12/24 1351 01/13/24 0924 01/25/24 1541 02/08/24 0935     Exercise Goal Re-Evaluation   Exercise Goals Review Able to understand and use rate of perceived exertion (RPE) scale;Knowledge and understanding of Target Heart Rate Range (THRR);Able to understand and use Dyspnea scale;Understanding of Exercise Prescription Increase Physical Activity;Increase Strength and Stamina;Understanding of Exercise Prescription Increase Physical Activity;Increase Strength and Stamina;Understanding of Exercise Prescription Increase Physical Activity;Increase Strength and Stamina;Understanding of Exercise Prescription Increase Physical Activity;Increase Strength and Stamina;Understanding of Exercise Prescription   Comments Reviewed RPE and dyspnea scale, THR and  program prescription with pt today.  Pt voiced understanding and was given a copy of goals to take home. [name] is off to a great start in the program. He was able to attend his first 2 sessions during this review. He was also able to use the treadmill at 2. at a 1% incline, and the XR at level 3. We will continue to monitor his progress in the program. [name] reports he is doing well with exercise here at rehab. He has done the treadmill and XR machines. Today he will try the T6. Encouraged him to increase workload as able. For exercise outside of rehab he says he is outside working with his chicken and is active with his dogs. [name] is doing well in rehab. He was able to increase his treadmill workload to a speed of 2. and 1.5% incline. He was also able to  increase from level 3 to 5 on the XR. We will continue to monitor his progress in the program. [name] is doing well in rehab. He walks a lot at home is active with yard work. Encouraged him to work on increasing workload while he is here at rehab.   Expected Outcomes Short: Use RPE daily to regulate intensity. Long: Follow program prescription in THR. Short: Continue to follow exercise prescription. Long: Continue exercise to improve strength and stamina. STg: Continue to follow exercise prescription and increase workloads as able. Continue exercise to improve strength and stamina. Short: Continue to increase treadmill workload. Long: Continue exercise to improve strength and stamina. STG: Increase workload as able. LTG: Continue exercise to improve strength and stamina.    Row Name 02/10/24 1559 02/22/24 1424 03/07/24 1423 03/09/24 1041 03/22/24 1450     Exercise Goal Re-Evaluation   Exercise Goals Review Increase Physical Activity;Increase Strength and Stamina;Understanding of Exercise Prescription Increase Physical Activity;Increase Strength and Stamina;Understanding of Exercise Prescription Increase Physical Activity;Increase Strength and  Stamina;Understanding of Exercise Prescription Increase Physical Activity;Increase Strength and Stamina;Able to understand and use rate of perceived exertion (RPE) scale;Able to understand and use Dyspnea scale;Knowledge and understanding of Target Heart Rate Range (THRR);Able to check pulse independently;Understanding of Exercise Prescription Increase Physical Activity;Increase Strength and Stamina;Understanding of Exercise Prescription   Comments [name] has only attended one session of rehab since the last review. His speed on the treadmill decreased down to 2.3 mph. He also decreased to level 2 on the XR. We will continue to monitor his progress in the program. [name] continues to do well in rehab. He recently increased his treadmill workload by increasing his incline to 3% while maintaining his speed at 2.3 mph. He also improved back up to level 4 on the XR and began using the T5 nustep at level 4. We will continue to monitor his progress in the program. [name] continues to do well in rehab. He is due for his post and hopes to improve. He was able to increase his treadmill workload by increasing his incline to 5% while maitaining a speed of 2.3 mph. He maintained level 4 on the XR and T5 nustep. We will continue to monitor his progress in the program. Reviewed home exercise with pt today.  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  Reviewed THR, pulse, RPE, sign and symptoms, pulse oximetery and when to call 911 or MD.  Also discussed weather considerations and indoor options.  Pt voiced understanding. [name] is doing well in rehab. He recently completed his post-6MWT and was able to increase by 53feet. He was also able to increase from level 4 to 6 on the XR. We will continue to monitor his progress in the program.   Expected Outcomes Short: Attend rehab more consistently. Long: Continue exercise to improve strength and stamina. Short: Continue to  progressively increase treadmill workload. Long: Continue exercise to improve strength and stamina. Short: Improve on post . Long: Continue to increase overall METs and stamina. Short: Implement home exercise. Long: Continue to exercise at home and in the program to improve strength and stamina. Short: Graduate. Long: Continue exercise to improve strength and stamina.      Discharge Exercise Prescription (Final Exercise Prescription Changes):  Exercise Prescription Changes - 03/22/24 1400       Response to Exercise   Blood Pressure (Admit) 142/64    Blood Pressure (Exit) 112/64  Heart Rate (Admit) 67 bpm    Heart Rate (Exercise) 85 bpm    Heart Rate (Exit) 58 bpm    Rating of Perceived Exertion (Exercise) 17    Symptoms none    Duration Continue with 30 min of aerobic exercise without signs/symptoms of physical distress.    Intensity THRR unchanged      Progression   Progression Continue to progress workloads to maintain intensity without signs/symptoms of physical distress.    Average METs 3.6      Resistance Training   Training Prescription Yes    Weight 4 lb    Reps 10-15      Interval Training   Interval Training No      Treadmill   MPH 2.3    Grade 5    Minutes 15    METs 4.35      Elliptical   Level 1    Speed 2    Minutes 15    METs 3.4      REL-XR   Level 6    Minutes 15    METs 3.4      T5 Nustep   Level 5    Minutes 15    METs 2.4      Home Exercise Plan   Plans to continue exercise at Home (comment)   [name] plans to continue to walk outside around his house. 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   Frequency Add 2 additional days to program exercise sessions.    Initial Home Exercises Provided 03/09/24      Oxygen   Maintain Oxygen Saturation 88% or higher          Nutrition:  Target Goals: Understanding of nutrition guidelines, daily intake of sodium 1500mg , cholesterol 200mg , calories 30% from fat and 7% or less  from saturated fats, daily to have 5 or more servings of fruits and vegetables.  Education: All About Nutrition: -Group instruction provided by verbal, written material, interactive activities, discussions, models, and posters to present general guidelines for heart healthy nutrition including fat, fiber, MyPlate, the role of sodium in heart healthy nutrition, utilization of the nutrition label, and utilization of this knowledge for meal planning. Follow up email sent as well. Written material given at graduation. Flowsheet Row Cardiac Rehab from 03/16/2024 in [facility] Cardiac and Pulmonary Rehab  Date 02/24/24  Educator JG  Instruction Review Code 1- Verbalizes Understanding    Biometrics:  Pre Biometrics - 12/29/23 1123       Pre Biometrics   Height 5' 9.5 (1.765 m)    Weight 157 lb 11.2 oz (71.5 kg)    Waist Circumference 34 inches    Hip Circumference 36 inches    Waist to Hip Ratio 0.94 %    BMI (Calculated) 22.96    Single Leg Stand 23.7 seconds          Post Biometrics - 03/13/24 0928        Post  Biometrics   Height 5' 9.5 (1.765 m)    Weight 155 lb 1.6 oz (70.4 kg)    Waist Circumference 34 inches    Hip Circumference 36 inches    Waist to Hip Ratio 0.94 %    BMI (Calculated) 22.58    Single Leg Stand 17.66 seconds          Nutrition Therapy Plan and Nutrition Goals:  Nutrition Therapy & Goals - 12/29/23 1346       Nutrition Therapy   Diet  Cardiac, Low Na    Protein (specify units) 75-90    Fiber 30 grams    Whole Grain Foods 3 servings    Saturated Fats 15 max. grams    Fruits and Vegetables 5 servings/day    Sodium 2 grams      Personal Nutrition Goals   Nutrition Goal Read labels and reduce sodium intake to below 2300mg . Ideally 1500mg  per day.    Personal Goal #2 Reduce saturated fat, less than 12g per day. Replace bad fats for more heart healthy fats.    Personal Goal #3 Eat 15-30gProtein and 30-60gCarbs at each meal.    Comments Patient drinking  ~32oz of water  and ~32oz of regular soda. Spoke with him about drinking sugary and calorie dense beverages. He feels its part of his morning routine and would struggle with energy without it. His A1C is normal. Reviewed mediterranean diet handout. Educated on types of fats, sources and how to read label. Reviewed balanced plates handout. Discussed the importance of including colorful produce at plates. Explained the benefits of pairing carbs with protein or healthy fats. Brainstormed meal and snacks ideas with focus on variety while keeping sodium and saturated fat intake below the limits provided of 1500mg  and 12g respectively      Intervention Plan   Intervention Prescribe, educate and counsel regarding individualized specific dietary modifications aiming towards targeted core components such as weight, hypertension, lipid management, diabetes, heart failure and other comorbidities.;Nutrition handout(s) given to patient.    Expected Outcomes Long Term Goal: Adherence to prescribed nutrition plan.;Short Term Goal: A plan has been developed with personal nutrition goals set during dietitian appointment.;Short Term Goal: Understand basic principles of dietary content, such as calories, fat, sodium, cholesterol and nutrients.          Nutrition Assessments:  MEDIFICTS Score Key: >=70 Need to make dietary changes  40-70 Heart Healthy Diet <= 40 Therapeutic Level Cholesterol Diet  Flowsheet Row Cardiac Rehab from 03/14/2024 in [facility]  Picture Your Plate Total Score on Admission 63  Picture Your Plate Total Score on Discharge 50   Picture Your Plate Scores: <59 Unhealthy dietary pattern with much room for improvement. 41-50 Dietary pattern unlikely to meet recommendations for good health and room for improvement. 51-60 More healthful dietary pattern, with some room for improvement.  >60 Healthy dietary pattern, although there may be some specific behaviors that could be  improved.    Nutrition Goals Re-Evaluation:  Nutrition Goals Re-Evaluation     Row Name 01/13/24 0933 02/08/24 9062 03/07/24 0921         Goals   Comment Takashi reports he feels he is doing well with nutrition. He says his portions are small but he includes protein at his meals. Reminded him to read labels and keep sodium controlled, below 1500mg  ideally. Gabryel says he is doing well with nutrition. Eats most homecooked meals and rarely eats out and tries to stay away from processed foods. Eulis states that he is doing well with his diet at this time. He reports not eating out often and limiting processed foods. He continues to read labels and reduce sodium. He is also using sunflower oil to reduce saturated fat and replace bad fats for more healthy fats.     Expected Outcome STG: limit sodium below 1500mg . LTG: Follow a heart healthy diet that supports bodies needs STG: Continue to eat healthy meals at home. LTG: Follow a heart healthy diet that supports bodies needs Short: Continue  to limit sodium and saturated fat. Long: Continue to practice heart healthy diet discussed with RD.        Nutrition Goals Discharge (Final Nutrition Goals Re-Evaluation):  Nutrition Goals Re-Evaluation - 03/07/24 0921       Goals   Comment [name] states that he is doing well with his diet at this time. He reports not eating out often and limiting processed foods. He continues to read labels and reduce sodium. He is also using sunflower oil to reduce saturated fat and replace bad fats for more healthy fats.    Expected Outcome Short: Continue to limit sodium and saturated fat. Long: Continue to practice heart healthy diet discussed with RD.          Psychosocial: Target Goals: Acknowledge presence or absence of significant depression and/or stress, maximize coping skills, provide positive support system. Participant is able to verbalize types and ability to use techniques and skills needed for reducing stress and  depression.   Education: Stress, Anxiety, and Depression - Group verbal and visual presentation to define topics covered.  Reviews how body is impacted by stress, anxiety, and depression.  Also discusses healthy ways to reduce stress and to treat/manage anxiety and depression.  Written material given at graduation.   Education: Sleep Hygiene -Provides group verbal and written instruction about how sleep can affect your health.  Define sleep hygiene, discuss sleep cycles and impact of sleep habits. Review good sleep hygiene tips.    Initial Review & Psychosocial Screening:  Initial Psych Review & Screening - 12/23/23 1446       Initial Review   Current issues with None Identified      Family Dynamics   Good Support System? Yes   wife nad brother     Barriers   Psychosocial barriers to participate in program There are no identifiable barriers or psychosocial needs.      Screening Interventions   Interventions Encouraged to exercise;To provide support and resources with identified psychosocial needs;Provide feedback about the scores to participant    Expected Outcomes Short Term goal: Utilizing psychosocial counselor, staff and physician to assist with identification of specific Stressors or current issues interfering with healing process. Setting desired goal for each stressor or current issue identified.;Long Term Goal: Stressors or current issues are controlled or eliminated.;Short Term goal: Identification and review with participant of any Quality of Life or Depression concerns found by scoring the questionnaire.;Long Term goal: The participant improves quality of Life and PHQ9 Scores as seen by post scores and/or verbalization of changes          Quality of Life Scores:   Quality of Life - 03/14/24 0950       Quality of Life Scores   Health/Function Post 28.61 %    Socioeconomic Post 25.75 %    Psych/Spiritual Post 30 %    Family Post 24.75 %    GLOBAL Post 27.87 %          Scores of 19 and below usually indicate a poorer quality of life in these areas.  A difference of  2-3 points is a clinically meaningful difference.  A difference of 2-3 points in the total score of the Quality of Life Index has been associated with significant improvement in overall quality of life, self-image, physical symptoms, and general health in studies assessing change in quality of life.  PHQ-9: Review Flowsheet       03/14/2024 12/29/2023 05/19/2022  Depression screen PHQ 2/9  Decreased Interest 0  0 0  Down, Depressed, Hopeless 0 0 0  PHQ - 2 Score 0 0 0  Altered sleeping 0 1 -  Tired, decreased energy 0 0 -  Change in appetite 0 0 -  Feeling bad or failure about yourself  0 0 -  Trouble concentrating 0 0 -  Moving slowly or fidgety/restless 0 0 -  Suicidal thoughts 0 0 -  PHQ-9 Score 0 1 -  Difficult doing work/chores Not difficult at all Not difficult at all -   Interpretation of Total Score  Total Score Depression Severity:  1-4 = Minimal depression, 5-9 = Mild depression, 10-14 = Moderate depression, 15-19 = Moderately severe depression, 20-27 = Severe depression   Psychosocial Evaluation and Intervention:  Psychosocial Evaluation - 12/23/23 1502       Psychosocial Evaluation & Interventions   Interventions Encouraged to exercise with the program and follow exercise prescription    Comments There are o barriers to attending the program.  HE wishes to continue to heal and get back to his lawn mower, 4 whellers and usual activities.   He lives with his wife and she is his support.   He is ready to start the program.    Expected Outcomes STG attend all scheduled sessions, follow exercise progression guidelines.  LTG continues with exercise progression and nutriton plan to continue to work on heart healthy lifestyle    Continue Psychosocial Services  Follow up required by staff          Psychosocial Re-Evaluation:  Psychosocial Re-Evaluation     Row Name 01/13/24  [phone] 02/08/24 0936 03/07/24 0924         Psychosocial Re-Evaluation   Current issues with None Identified None Identified None Identified     Comments [name] denies any anxiety, depression or stress. Says he has a good support system and sleeps well. Reports he gets ~7hrs per night and wakes up well rested. [name] denies any stress, depression or aniety. He says he has a good support system. Reports he is getting ~7hrs per night and wakes up well rested. [name] reports no major stressors at this time. He states that enjoys coming to rehab for the exercise and environment. He reports that he has a good support system made up by his family, dogs, and chickens. He has no issues sleeping at night.     Expected Outcomes STG: Continue to attend cardiac rehab. LTG: achieve and maintain a positive outlook on health and daily life STG: Continue to attend cardiac rehab. LTG: achieve and maintain a positive outlook on health and daily life Short: Continue to attend rehab for exercise and mental boost. Long: Maintain positive outlook.     Interventions Encouraged to attend Cardiac Rehabilitation for the exercise Encouraged to attend Cardiac Rehabilitation for the exercise Encouraged to attend Cardiac Rehabilitation for the exercise     Continue Psychosocial Services  Follow up required by staff Follow up required by staff Follow up required by staff        Psychosocial Discharge (Final Psychosocial Re-Evaluation):  Psychosocial Re-Evaluation - 03/07/24 0924       Psychosocial Re-Evaluation   Current issues with None Identified    Comments [name] reports no major stressors at this time. He states that enjoys coming to rehab for the exercise and environment. He reports that he has a good support system made up by his family, dogs, and chickens. He has no issues sleeping at night.    Expected Outcomes Short: Continue to attend  rehab for exercise and mental boost. Long: Maintain positive outlook.    Interventions  Encouraged to attend Cardiac Rehabilitation for the exercise    Continue Psychosocial Services  Follow up required by staff          Vocational Rehabilitation: Provide vocational rehab assistance to qualifying candidates.   Vocational Rehab Evaluation & Intervention:   Education: Education Goals: Education classes will be provided on a variety of topics geared toward better understanding of heart health and risk factor modification. Participant will state understanding/return demonstration of topics presented as noted by education test scores.  Learning Barriers/Preferences:   General Cardiac Education Topics:  AED/CPR: - Group verbal and written instruction with the use of models to demonstrate the basic use of the AED with the basic ABC's of resuscitation.   Anatomy and Cardiac Procedures: - Group verbal and visual presentation and models provide information about basic cardiac anatomy and function. Reviews the testing methods done to diagnose heart disease and the outcomes of the test results. Describes the treatment choices: Medical Management, Angioplasty, or Coronary Bypass Surgery for treating various heart conditions including Myocardial Infarction, Angina, Valve Disease, and Cardiac Arrhythmias.  Written material given at graduation.   Medication Safety: - Group verbal and visual instruction to review commonly prescribed medications for heart and lung disease. Reviews the medication, class of the drug, and side effects. Includes the steps to properly store meds and maintain the prescription regimen.  Written material given at graduation. Flowsheet Row Cardiac Rehab from 03/16/2024 in [facility] Cardiac and Pulmonary Rehab  Date 03/16/24  Educator [facility]  Instruction Review Code 1- Verbalizes Understanding    Intimacy: - Group verbal instruction through game format to discuss how heart and lung disease can affect sexual intimacy. Written material given at graduation.. Flowsheet  Row Cardiac Rehab from 03/16/2024 in [facility] Cardiac and Pulmonary Rehab  Date 02/17/24  Educator MB  Instruction Review Code 1- Verbalizes Understanding    Know Your Numbers and Heart Failure: - Group verbal and visual instruction to discuss disease risk factors for cardiac and pulmonary disease and treatment options.  Reviews associated critical values for Overweight/Obesity, Hypertension, Cholesterol, and Diabetes.  Discusses basics of heart failure: signs/symptoms and treatments.  Introduces Heart Failure Zone chart for action plan for heart failure.  Written material given at graduation. Flowsheet Row Cardiac Rehab from 03/16/2024 in [facility] Cardiac and Pulmonary Rehab  Date 01/13/24  Educator SB  Instruction Review Code 1- Verbalizes Understanding    Infection Prevention: - Provides verbal and written material to individual with discussion of infection control including proper hand washing and proper equipment cleaning during exercise session. Flowsheet Row Cardiac Rehab from 03/16/2024 in [facility] Cardiac and Pulmonary Rehab  Date 12/29/23  Educator NT  Instruction Review Code 1- Verbalizes Understanding    Falls Prevention: - Provides verbal and written material to individual with discussion of falls prevention and safety. Flowsheet Row Cardiac Rehab from 03/16/2024 in [facility] Cardiac and Pulmonary Rehab  Date 12/23/23  Educator SB  Instruction Review Code 1- Verbalizes Understanding    Other: -Provides group and verbal instruction on various topics (see comments)   Knowledge Questionnaire Score:  Knowledge Questionnaire Score - 03/14/24 0951       Knowledge Questionnaire Score   Post Score 22/26          Core Components/Risk Factors/Patient Goals at Admission:  Personal Goals and Risk Factors at Admission - 12/23/23 1447       Core Components/Risk Factors/Patient Goals on Admission  Weight Management Yes    Intervention Weight Management: Develop a combined nutrition and  exercise program designed to reach desired caloric intake, while maintaining appropriate intake of nutrient and fiber, sodium and fats, and appropriate energy expenditure required for the weight goal.;Weight Management: Provide education and appropriate resources to help participant work on and attain dietary goals.    Admit Weight 154 lb (69.9 kg)    Goal Weight: Short Term 155 lb (70.3 kg)    Goal Weight: Long Term 178 lb (80.7 kg)    Expected Outcomes Short Term: Continue to assess and modify interventions until short term weight is achieved;Long Term: Adherence to nutrition and physical activity/exercise program aimed toward attainment of established weight goal;Weight Gain: Understanding of general recommendations for a high calorie, high protein meal plan that promotes weight gain by distributing calorie intake throughout the day with the consumption for 4-5 meals, snacks, and/or supplements    Hypertension Yes    Intervention Provide education on lifestyle modifcations including regular physical activity/exercise, weight management, moderate sodium restriction and increased consumption of fresh fruit, vegetables, and low fat dairy, alcohol moderation, and smoking cessation.;Monitor prescription use compliance.    Expected Outcomes Short Term: Continued assessment and intervention until BP is < 140/61mm HG in hypertensive participants. < 130/14mm HG in hypertensive participants with diabetes, heart failure or chronic kidney disease.;Long Term: Maintenance of blood pressure at goal levels.    Lipids Yes    Intervention Provide education and support for participant on nutrition & aerobic/resistive exercise along with prescribed medications to achieve LDL 70mg , HDL >40mg .    Expected Outcomes Short Term: Participant states understanding of desired cholesterol values and is compliant with medications prescribed. Participant is following exercise prescription and nutrition guidelines.;Long Term:  Cholesterol controlled with medications as prescribed, with individualized exercise RX and with personalized nutrition plan. Value goals: LDL < 70mg , HDL > 40 mg.          Education:Diabetes - Individual verbal and written instruction to review signs/symptoms of diabetes, desired ranges of glucose level fasting, after meals and with exercise. Acknowledge that pre and post exercise glucose checks will be done for 3 sessions at entry of program.   Core Components/Risk Factors/Patient Goals Review:   Goals and Risk Factor Review     Row Name 01/13/24 0935 02/08/24 0939 03/07/24 0927         Core Components/Risk Factors/Patient Goals Review   Personal Goals Review Hypertension Hypertension Hypertension     Review [name] reports he does check his blood pressure at home. Says it consistently matches the readings her at rehab. Encouraged him to continue to check his BP at home and let the rehab team if there are any changes in readings [name] reports he checks his blood pressure at home about once per week. Encouraged him to check a few times per week. More if readings are concerning. [name] checks his BP at home a couple of times each week. He states that his BP readings have been within normal ranges and consistent with his readings in rehab. He continues to take all of his medications as prescribed.     Expected Outcomes STG: Check BP at home. LTG: manage risk factors independently STG: Check BP at home 4-5 times per week. LTG: manage risk factors independently Short: Continue to monitor BP readings at home. Long: Continue to manage lifestyle risk factors.        Core Components/Risk Factors/Patient Goals at Discharge (Final Review):   Goals and Risk Factor  Review - 03/07/24 0927       Core Components/Risk Factors/Patient Goals Review   Personal Goals Review Hypertension    Review [name] checks his BP at home a couple of times each week. He states that his BP readings have been within normal ranges  and consistent with his readings in rehab. He continues to take all of his medications as prescribed.    Expected Outcomes Short: Continue to monitor BP readings at home. Long: Continue to manage lifestyle risk factors.          ITP Comments:  ITP Comments     Row Name 12/23/23 1501 12/29/23 1130 01/03/24 0935 01/26/24 1329 02/23/24 0958   ITP Comments Virtual orientation call completed today. he has an appointment on Date: 12/29/2023  for EP eval and gym Orientation.  Documentation of diagnosis can be found in [facility] 11/08/2023 . Completed and gym orientation for cardiac rehab. Initial ITP created and sent for review to Dr. Oneil Pinal, Medical Director. First full day of exercise!  Patient was oriented to gym and equipment including functions, settings, policies, and procedures.  Patient's individual exercise prescription and treatment plan were reviewed.  All starting workloads were established based on the results of the 6 minute walk test done at initial orientation visit.  The plan for exercise progression was also introduced and progression will be customized based on patient's performance and goals. 30 Day review completed. Medical Director ITP review done, changes made as directed, and signed approval by Medical Director.    new to prpgram 30 Day review completed. Medical Director ITP review done, changes made as directed, and signed approval by Medical Director.    Row Name 03/22/24 1144 03/28/24 0952         ITP Comments 30 Day review completed. Medical Director ITP review done, changes made as directed, and signed approval by Medical Director. [name] graduated today from  rehab with 34 sessions completed.  Details of the patient's exercise prescription and what He needs to do in order to continue the prescription and progress were discussed with patient.  Patient was given a copy of prescription and goals.  Patient verbalized understanding. [name] plans to continue to exercise by walking  around his house and staying active at his job.         Comments:  Discharge ITP

## 2024-03-28 NOTE — Progress Notes (Signed)
 Discharge Summary   [name]  DOB: 11/14/1965   [name] graduated today from  rehab with 34 sessions completed.  Details of the patient's exercise prescription and what He needs to do in order to continue the prescription and progress were discussed with patient.  Patient was given a copy of prescription and goals.  Patient verbalized understanding. [name] plans to continue to exercise by walking around his house and staying active at his job.   6 Minute Walk     Row Name 12/29/23 1120 03/13/24 0926       6 Minute Walk   Phase Initial Discharge    Distance 1330 feet 1420 feet    Distance % Change -- 7 %    Distance Feet Change -- 90 ft    Walk Time 6 minutes 6 minutes    # of Rest Breaks 0 0    MPH 2.52 2.69    METS 3.49 3.73    RPE 9 11    Perceived Dyspnea  1 1    VO2 Peak 12.22 13.05    Symptoms No No    Resting HR 52 bpm 65 bpm    Resting BP 124/58 122/82    Resting Oxygen Saturation  99 % --    Exercise Oxygen Saturation  during 6 min walk 99 % --    Max Ex. HR 62 bpm 68 bpm    Max Ex. BP 128/60 128/82    2 Minute Post BP 126/64 120/80

## 2024-04-20 ENCOUNTER — Ambulatory Visit: Payer: Self-pay | Attending: Vascular Surgery | Admitting: Physician Assistant

## 2024-04-20 VITALS — BP 129/72 | HR 73 | Temp 97.7°F | Wt 156.2 lb

## 2024-04-20 DIAGNOSIS — I6523 Occlusion and stenosis of bilateral carotid arteries: Secondary | ICD-10-CM

## 2024-04-20 NOTE — Progress Notes (Signed)
 POST OPERATIVE OFFICE NOTE    CC:  F/u for surgery  HPI:  [name] is a 58 y.o. male who is here for postop visit.  He recently underwent left carotid endarterectomy on 03/17/2024 by Dr.Robins for asymptomatic critical ICA stenosis.  Prior to this he underwent right carotid endarterectomy on 01/25/2024 by Dr. Lanis for asymptomatic critical ICA stenosis.  He returns today for follow-up.  He says that he is feeling great.  He denies any issues with his left-sided neck incision such as drainage, redness, or tenderness.  He denies any strokelike symptoms since his procedure such as slurred speech, focal weakness/numbness, sudden visual changes, or lip droop.  He takes his daily aspirin , Plavix , statin.  He says he is working with his cardiologist to transition to Repatha because his pravastatin  gives him muscle pains.   Allergies  Allergen Reactions   Atorvastatin Other (See Comments)    Pain and cramps. Like hit by a truck    Lopid [Gemfibrozil] Other (See Comments)    Legs hurt    Current Outpatient Medications  Medication Sig Dispense Refill   acetaminophen  (TYLENOL ) 325 MG tablet Take 650 mg by mouth every 6 (six) hours as needed for moderate pain (pain score 4-6).     amLODipine  (NORVASC ) 10 MG tablet Take 10 mg by mouth daily.     aspirin  81 MG tablet Take 81 mg by mouth daily.      clopidogrel  (PLAVIX ) 75 MG tablet Take 1 tablet (75 mg total) by mouth daily. 90 tablet 1   Coenzyme Q10 100 MG TABS Take 100 mg by mouth daily.     furosemide  (LASIX ) 40 MG tablet Take 1 tablet (40 mg total) by mouth daily. (Patient not taking: Reported on 02/17/2024) 7 tablet 0   loratadine  (CLARITIN ) 10 MG tablet Take 1 tablet (10 mg total) by mouth daily. 90 tablet 1   metoprolol  succinate (TOPROL -XL) 100 MG 24 hr tablet Take 1 tablet (100 mg total) by mouth daily. Take with or immediately following a meal. 30 tablet 1   Omega-3 Fatty Acids (FISH OIL PO) Take 690 mg by mouth daily.      oxyCODONE -acetaminophen  (PERCOCET) 5-325 MG tablet Take 1 tablet by mouth every 6 (six) hours as needed for severe pain (pain score 7-10). 12 tablet 0   pravastatin  (PRAVACHOL ) 40 MG tablet Take 1 tablet (40 mg total) by mouth at bedtime. 30 tablet 2   No current facility-administered medications for this visit.     ROS:  See HPI  Physical Exam:  Incision: Bilateral neck incisions well-healed without signs of infection or hematoma Extremities: Palpable and equal radial pulses bilaterally Neuro: Moving all extremities equally without deficit, no slurred speech, no lip droop    Assessment/Plan:  This is a 58 y.o. male who is here for postop visit  - The patient recently underwent left carotid endarterectomy on 03/17/2024 for asymptomatic critical ICA stenosis.  Prior to this he also underwent right carotid endarterectomy on 01/25/2024 also for asymptomatic critical disease - Both of his neck incisions are well-healed without signs of infection or hematoma -He remains neurologically intact.  He denies any strokelike symptoms such as slurred speech, sudden visual changes, sudden weakness/numbness, or lip droop - He has palpable and equal radial pulses on exam -He will continue his aspirin , Plavix , and statin.  If he does get approved to transition to Repatha, we are fine with this. - He can follow-up with our office in 3 months with bilateral carotid  artery duplex   Ahmed Holster, PA-C [facility] [phone]   Call MD: Gretta

## 2024-04-21 ENCOUNTER — Other Ambulatory Visit: Payer: Self-pay | Admitting: *Deleted

## 2024-04-21 DIAGNOSIS — I6523 Occlusion and stenosis of bilateral carotid arteries: Secondary | ICD-10-CM

## 2024-06-19 DIAGNOSIS — Z23 Encounter for immunization: Secondary | ICD-10-CM | POA: Diagnosis not present

## 2024-06-19 DIAGNOSIS — I25721 Atherosclerosis of autologous artery coronary artery bypass graft(s) with angina pectoris with documented spasm: Secondary | ICD-10-CM | POA: Diagnosis not present

## 2024-06-19 DIAGNOSIS — I25118 Atherosclerotic heart disease of native coronary artery with other forms of angina pectoris: Secondary | ICD-10-CM | POA: Diagnosis not present

## 2024-06-19 DIAGNOSIS — I10 Essential (primary) hypertension: Secondary | ICD-10-CM | POA: Diagnosis not present

## 2024-07-19 NOTE — Progress Notes (Unsigned)
 Office Note     CC:  follow up Requesting Provider:  Cyrus Selinda Fisher,*  HPI: [name] is a 58 y.o. ([birth year]) male who presents for surveillance follow up of carotid artery stenosis. He is recently s/p left carotid endarterectomy on 03/17/2024 and just prior to that he had right carotid endarterectomy on 01/25/2024 by Dr. Lanis, both for asymptomatic critical ICA stenosis.   Today he overall is doing well. He says he has been experiencing this tingling feeling on Carl right side of his head that goes down Carl back side of his neck and to his right shoulder. This happens intermittently. No aggravating Fisher alleviating factors. He denies any pain in his neck. He says this began more recently over past month Fisher so. He otherwise denies any visual symptoms, slurred speech, facial drooping, unilateral upper Fisher lower extremity weakness Fisher numbness. He does not have any pain in his legs on ambulation Fisher rest. No tissue loss. He is medically managed on Aspirin , Statin and Plavix .  Past Medical History:  Diagnosis Date   CAD (coronary artery disease)    CABG x4 February 2025 by [name]   Carotid artery occlusion    Dysrhythmia    post-CABG PAF   HLD (hyperlipidemia)    Hypertension    PVC (premature ventricular contraction)    Thrombocytopenia     Past Surgical History:  Procedure Laterality Date   COLONOSCOPY WITH PROPOFOL  N/A 12/12/2015   Procedure: COLONOSCOPY WITH PROPOFOL ;  Surgeon: [name] Holmes, MD;  Location: [name];  Service: Fisher;  Laterality: N/A;   COLONOSCOPY WITH PROPOFOL  N/A 06/23/2022   Procedure: COLONOSCOPY WITH PROPOFOL ;  Surgeon: Carl Ole ONEIDA, MD;  Location: [name];  Service: Fisher;  Laterality: N/A;   CORONARY ARTERY BYPASS GRAFT N/A 11/08/2023   Procedure: CORONARY ARTERY BYPASS GRAFTING (CABG) TIMES THREE USING LEFT INTERNAL MAMMARY ARTERY AND ENDOSCOPICALLY HARVESTED RIGHT GREATER SAPHENOUS VEIN;  Surgeon: Carl Linnie KIDD,  MD;  Location: [name];  Service: Open Heart Surgery;  Laterality: N/A;   CORONARY STENT INTERVENTION N/A 02/24/2017   Procedure: Coronary Stent Intervention;  Surgeon: Carl Blunt, MD;  Location: [name];  Service: Cardiovascular;  Laterality: N/A;   CORONARY STENT INTERVENTION N/A 11/11/2018   Procedure: CORONARY STENT INTERVENTION;  Surgeon: Carl Cara BIRCH, MD;  Location: [name];  Service: Cardiovascular;  Laterality: N/A;   ENDARTERECTOMY Right 01/25/2024   Procedure: ENDARTERECTOMY, CAROTID; WITH PATCH ANGIOPLASTY;  Surgeon: Carl Fonda BRAVO, MD;  Location: [name];  Service: Vascular;  Laterality: Right;   ENDARTERECTOMY Left 03/17/2024   Procedure: ENDARTERECTOMY, CAROTID;  Surgeon: Carl Fonda BRAVO, MD;  Location: [name];  Service: Vascular;  Laterality: Left;   ESOPHAGOGASTRODUODENOSCOPY (EGD) WITH PROPOFOL  N/A 06/23/2022   Procedure: ESOPHAGOGASTRODUODENOSCOPY (EGD) WITH PROPOFOL ;  Surgeon: Carl Ole ONEIDA, MD;  Location: [name];  Service: Fisher;  Laterality: N/A;   LEFT HEART CATH AND CORONARY ANGIOGRAPHY Left 02/24/2017   Procedure: Left Heart Cath and Coronary Angiography;  Surgeon: Carl Blunt, MD;  Location: [name];  Service: Cardiovascular;  Laterality: Left;   LEFT HEART CATH AND CORONARY ANGIOGRAPHY N/A 11/11/2018   Procedure: LEFT HEART CATH AND CORONARY ANGIOGRAPHY with possible pci and stent;  Surgeon: Carl Cara BIRCH, MD;  Location: [name];  Service: Cardiovascular;  Laterality: N/A;   LEFT HEART CATH AND CORONARY ANGIOGRAPHY Left 10/27/2023   Procedure: LEFT HEART CATH AND CORONARY ANGIOGRAPHY;  Surgeon: Carl Blunt, MD;  Location: [name];  Service: Cardiovascular;  Laterality: Left;   stents     TEE WITHOUT CARDIOVERSION N/A 11/08/2023   Procedure: TRANSESOPHAGEAL ECHOCARDIOGRAM (TEE);  Surgeon: Carl Linnie KIDD, MD;  Location: [name];  Service: Open Heart Surgery;   Laterality: N/A;    Social History   Socioeconomic History   Marital status: Married    Spouse name: Stacy   Number of children: 0   Years of education: Not on file   Highest education level: Not on file  Occupational History   Not on file  Tobacco Use   Smoking status: Former    Current packs/day: 0.00    Average packs/day: 0.3 packs/day for 25.0 years (6.3 ttl pk-yrs)    Types: Cigarettes    Start date: 09/1992    Quit date: 09/2017    Years since quitting: 6.8   Smokeless tobacco: Never  Vaping Use   Vaping status: Never Used  Substance and Sexual Activity   Alcohol use: Yes    Alcohol/week: 14.0 standard drinks of alcohol    Types: 14 Cans of beer per week    Comment: daily 2 drinks beer   Drug use: No   Sexual activity: Not Currently  Other Topics Concern   Not on file  Social History Narrative   Lives at home with wife. 2 dogs & 1 cat & 80 chickens    Social Drivers of Corporate Investment Banker Strain: Low Risk  (01/10/2024)   Received from [name]   Overall Financial Resource Strain (CARDIA)    Difficulty of Paying Living Expenses: Not hard at all  Food Insecurity: No Food Insecurity (03/17/2024)   Hunger Vital Sign    Worried About Running Out of Food in Carl Last Year: Never true    Ran Out of Food in Carl Last Year: Never true  Transportation Needs: No Transportation Needs (03/17/2024)   PRAPARE - Administrator, Civil Service (Medical): No    Lack of Transportation (Non-Medical): No  Physical Activity: Insufficiently Active (09/16/2017)   Received from [name]   Exercise Vital Sign    Days of Exercise per Week: 4 days    Minutes of Exercise per Session: 30 min  Stress: Stress Concern Present (09/16/2017)   Received from [name]    Feeling of Stress : Rather much  Social Connections: Somewhat  Isolated (09/16/2017)   Received from [name]   Social Connection and Isolation Panel    Frequency of Communication with Friends and Family: More than three times a week    Frequency of Social Gatherings with Friends and Family: More than three times a week    Attends Religious Services: Never    Database Administrator Fisher Organizations: Not on file    Attends Banker Meetings: Never    Marital Status: Married  Catering Manager Violence: Not At Risk (03/17/2024)   Humiliation, Afraid, Rape, and Kick Fisher    Fear of Current Fisher Ex-Partner: No    Emotionally Abused: No    Physically Abused: No    Sexually Abused: No    Family History  Problem Relation Age of Onset   CAD Mother    Hypertension Mother    Cancer Father    CAD Father     Current Outpatient Medications  Medication Sig Dispense Refill   acetaminophen  (TYLENOL ) 325 MG  tablet Take 650 mg by mouth every 6 (six) hours as needed for moderate pain (pain score 4-6).     amLODipine  (NORVASC ) 10 MG tablet Take 10 mg by mouth daily.     aspirin  81 MG tablet Take 81 mg by mouth daily.      clopidogrel  (PLAVIX ) 75 MG tablet Take 1 tablet (75 mg total) by mouth daily. 90 tablet 1   Coenzyme Q10 100 MG TABS Take 100 mg by mouth daily.     furosemide  (LASIX ) 40 MG tablet Take 1 tablet (40 mg total) by mouth daily. (Patient not taking: Reported on 02/17/2024) 7 tablet 0   loratadine  (CLARITIN ) 10 MG tablet Take 1 tablet (10 mg total) by mouth daily. 90 tablet 1   metoprolol  succinate (TOPROL -XL) 100 MG 24 hr tablet Take 1 tablet (100 mg total) by mouth daily. Take with Fisher immediately following a meal. 30 tablet 1   Omega-3 Fatty Acids (FISH OIL PO) Take 690 mg by mouth daily.     oxyCODONE -acetaminophen  (PERCOCET) 5-325 MG tablet Take 1 tablet by mouth every 6 (six) hours as needed for severe pain (pain score 7-10). 12 tablet 0   pravastatin  (PRAVACHOL ) 40 MG tablet Take 1 tablet (40 mg total) by  mouth at bedtime. 30 tablet 2   No current facility-administered medications for this visit.    Allergies  Allergen Reactions   Atorvastatin Other (See Comments)    Pain and cramps. Like hit by a truck    Lopid [Gemfibrozil] Other (See Comments)    Legs hurt     REVIEW OF SYSTEMS:  Negative unless noted in HPI [X]  denotes positive finding, [ ]  denotes negative finding Cardiac  Comments:  Chest pain Fisher chest pressure:    Shortness of breath upon exertion:    Short of breath when lying flat:    Irregular heart rhythm:        Vascular    Pain in calf, thigh, Fisher hip brought on by ambulation:    Pain in feet at night that wakes you up from your sleep:     Blood clot in your veins:    Leg swelling:         Pulmonary    Oxygen at home:    Productive cough:     Wheezing:         Neurologic    Sudden weakness in arms Fisher legs:     Sudden numbness in arms Fisher legs:     Sudden onset of difficulty speaking Fisher slurred speech:    Temporary loss of vision in one eye:     Problems with dizziness:         Gastrointestinal    Blood in stool:     Vomited blood:         Genitourinary    Burning when urinating:     Blood in urine:        Psychiatric    Major depression:         Hematologic    Bleeding problems:    Problems with blood clotting too easily:        Skin    Rashes Fisher ulcers:        Constitutional    Fever Fisher chills:      PHYSICAL EXAMINATION:  Vitals:   07/20/24 0819 07/20/24 0822  BP: (!) 151/82 (!) 161/81  Pulse: 63 64  Temp: 98 F (36.7 C)   TempSrc: Temporal   Weight: 164 lb  12.8 oz (74.8 kg)     General:  WDWN in NAD; vital signs documented above Gait: Normal HENT: WNL, normocephalic Pulmonary: normal non-labored breathing Cardiac: regular HR; no carotid bruits Abdomen: soft Vascular Exam/Pulses: 2+ radial, 2+ DP pulses bilaterally Extremities: without ischemic changes, without Gangrene , without cellulitis; without open wounds; moving all  extremities without any deficits Musculoskeletal: no muscle wasting Fisher atrophy  Neurologic: A&O X 3 Psychiatric:  Carl pt has Normal affect.   Non-Invasive Vascular Imaging:   VAS US  Carotid Duplex Bilateral: Summary:  Right Carotid: Carl ECA appears >50% stenosed. Patent right carotid endarterectomy with no evidence for resstenosis.   Left Carotid: Patent left carotid endarterectomy with no evidence for restenosis.   Vertebrals:  Bilateral vertebral arteries demonstrate antegrade flow.  Subclavians: Normal flow hemodynamics were seen in bilateral subclavian arteries.   ASSESSMENT/PLAN:: 58 y.o. male here for surveillance follow up of carotid artery stenosis. He is recently s/p left carotid endarterectomy on 03/17/2024 and just prior to that he had right carotid endarterectomy on 01/25/2024 by Dr. Lanis, both for asymptomatic critical ICA stenosis. He is without any associated TIA Fisher stroke like symptoms. Both of his incisions have healed very well.  - Duplex today shows patent bilateral ICA's with no evidence of restenosis. Normal flow in Carl vertebral and subclavian arteries bilaterally - Continue Aspirin , Plavix  and Statin - Follow up in 1 year with repeat carotid duplex   Carl Damme, PA-C [facility] [phone]  Clinic MD:   Carl

## 2024-07-20 ENCOUNTER — Encounter: Payer: Self-pay | Admitting: Physician Assistant

## 2024-07-20 ENCOUNTER — Ambulatory Visit (HOSPITAL_COMMUNITY)
Admission: RE | Admit: 2024-07-20 | Discharge: 2024-07-20 | Disposition: A | Discharge status: Home / Self Care | Source: Ambulatory Visit | Attending: Vascular Surgery | Admitting: Vascular Surgery

## 2024-07-20 ENCOUNTER — Ambulatory Visit

## 2024-07-20 VITALS — BP 161/81 | HR 64 | Temp 98.0°F | Wt 164.8 lb

## 2024-07-20 DIAGNOSIS — I6523 Occlusion and stenosis of bilateral carotid arteries: Secondary | ICD-10-CM | POA: Insufficient documentation

## 2024-07-31 DIAGNOSIS — M5414 Radiculopathy, thoracic region: Secondary | ICD-10-CM | POA: Diagnosis not present

## 2024-07-31 DIAGNOSIS — R519 Headache, unspecified: Secondary | ICD-10-CM | POA: Diagnosis not present

## 2024-07-31 DIAGNOSIS — M9901 Segmental and somatic dysfunction of cervical region: Secondary | ICD-10-CM | POA: Diagnosis not present

## 2024-07-31 DIAGNOSIS — M9902 Segmental and somatic dysfunction of thoracic region: Secondary | ICD-10-CM | POA: Diagnosis not present

## 2024-08-02 DIAGNOSIS — M5414 Radiculopathy, thoracic region: Secondary | ICD-10-CM | POA: Diagnosis not present

## 2024-08-02 DIAGNOSIS — M9902 Segmental and somatic dysfunction of thoracic region: Secondary | ICD-10-CM | POA: Diagnosis not present

## 2024-08-02 DIAGNOSIS — M9901 Segmental and somatic dysfunction of cervical region: Secondary | ICD-10-CM | POA: Diagnosis not present

## 2024-08-02 DIAGNOSIS — R519 Headache, unspecified: Secondary | ICD-10-CM | POA: Diagnosis not present

## 2024-08-08 DIAGNOSIS — M5414 Radiculopathy, thoracic region: Secondary | ICD-10-CM | POA: Diagnosis not present

## 2024-08-08 DIAGNOSIS — M9901 Segmental and somatic dysfunction of cervical region: Secondary | ICD-10-CM | POA: Diagnosis not present

## 2024-08-08 DIAGNOSIS — M9902 Segmental and somatic dysfunction of thoracic region: Secondary | ICD-10-CM | POA: Diagnosis not present

## 2024-08-08 DIAGNOSIS — R519 Headache, unspecified: Secondary | ICD-10-CM | POA: Diagnosis not present
